# Patient Record
Sex: Female | Born: 1950
Health system: Southern US, Community
[De-identification: ages and names within clinical notes are randomized; demographics above are authoritative.]

## PROBLEM LIST (undated history)

## (undated) DIAGNOSIS — I10 Essential (primary) hypertension: Secondary | ICD-10-CM

## (undated) DIAGNOSIS — E785 Hyperlipidemia, unspecified: Secondary | ICD-10-CM

## (undated) DIAGNOSIS — D126 Benign neoplasm of colon, unspecified: Secondary | ICD-10-CM

## (undated) DIAGNOSIS — D509 Iron deficiency anemia, unspecified: Secondary | ICD-10-CM

## (undated) HISTORY — PX: ABDOMINAL HYSTERECTOMY: SHX81

## (undated) HISTORY — DX: Hyperlipidemia, unspecified: E78.5

## (undated) HISTORY — DX: Essential (primary) hypertension: I10

## (undated) HISTORY — DX: Benign neoplasm of colon, unspecified: D12.6

## (undated) HISTORY — DX: Iron deficiency anemia, unspecified: D50.9

---

## 2001-04-11 ENCOUNTER — Encounter: Payer: Self-pay | Admitting: Family Medicine

## 2001-04-11 ENCOUNTER — Ambulatory Visit (HOSPITAL_COMMUNITY): Admission: RE | Admit: 2001-04-11 | Discharge: 2001-04-11 | Payer: Self-pay | Admitting: Family Medicine

## 2002-04-23 ENCOUNTER — Encounter: Payer: Self-pay | Admitting: Family Medicine

## 2002-04-23 ENCOUNTER — Ambulatory Visit (HOSPITAL_COMMUNITY): Admission: RE | Admit: 2002-04-23 | Discharge: 2002-04-23 | Payer: Self-pay | Admitting: Family Medicine

## 2003-04-25 ENCOUNTER — Ambulatory Visit (HOSPITAL_COMMUNITY): Admission: RE | Admit: 2003-04-25 | Discharge: 2003-04-25 | Payer: Self-pay | Admitting: Family Medicine

## 2003-06-30 ENCOUNTER — Ambulatory Visit (HOSPITAL_COMMUNITY): Admission: RE | Admit: 2003-06-30 | Discharge: 2003-06-30 | Payer: Self-pay | Admitting: Internal Medicine

## 2003-06-30 HISTORY — PX: COLONOSCOPY: SHX174

## 2004-04-26 ENCOUNTER — Ambulatory Visit (HOSPITAL_COMMUNITY): Admission: RE | Admit: 2004-04-26 | Discharge: 2004-04-26 | Payer: Self-pay | Admitting: Family Medicine

## 2004-05-26 ENCOUNTER — Ambulatory Visit (HOSPITAL_COMMUNITY): Admission: RE | Admit: 2004-05-26 | Discharge: 2004-05-26 | Payer: Self-pay | Admitting: Family Medicine

## 2005-05-18 ENCOUNTER — Ambulatory Visit (HOSPITAL_COMMUNITY): Admission: RE | Admit: 2005-05-18 | Discharge: 2005-05-18 | Payer: Self-pay | Admitting: Family Medicine

## 2006-02-07 ENCOUNTER — Emergency Department (HOSPITAL_COMMUNITY): Admission: EM | Admit: 2006-02-07 | Discharge: 2006-02-07 | Payer: Self-pay | Admitting: Emergency Medicine

## 2006-05-25 ENCOUNTER — Ambulatory Visit (HOSPITAL_COMMUNITY): Admission: RE | Admit: 2006-05-25 | Discharge: 2006-05-25 | Payer: Self-pay | Admitting: Family Medicine

## 2006-08-07 ENCOUNTER — Encounter (INDEPENDENT_AMBULATORY_CARE_PROVIDER_SITE_OTHER): Payer: Self-pay | Admitting: Specialist

## 2006-08-07 ENCOUNTER — Ambulatory Visit (HOSPITAL_COMMUNITY): Admission: RE | Admit: 2006-08-07 | Discharge: 2006-08-07 | Payer: Self-pay | Admitting: Internal Medicine

## 2006-08-07 ENCOUNTER — Ambulatory Visit: Payer: Self-pay | Admitting: Internal Medicine

## 2006-08-07 HISTORY — PX: COLONOSCOPY: SHX174

## 2007-08-28 ENCOUNTER — Ambulatory Visit (HOSPITAL_COMMUNITY): Admission: RE | Admit: 2007-08-28 | Discharge: 2007-08-28 | Payer: Self-pay | Admitting: Family Medicine

## 2007-12-05 ENCOUNTER — Ambulatory Visit: Payer: Self-pay | Admitting: Orthopedic Surgery

## 2007-12-05 DIAGNOSIS — M1710 Unilateral primary osteoarthritis, unspecified knee: Secondary | ICD-10-CM | POA: Insufficient documentation

## 2008-05-13 ENCOUNTER — Ambulatory Visit (HOSPITAL_COMMUNITY): Admission: RE | Admit: 2008-05-13 | Discharge: 2008-05-13 | Payer: Self-pay | Admitting: Family Medicine

## 2008-05-15 ENCOUNTER — Ambulatory Visit: Payer: Self-pay | Admitting: Cardiovascular Disease

## 2008-05-19 ENCOUNTER — Encounter: Payer: Self-pay | Admitting: Family Medicine

## 2008-05-19 ENCOUNTER — Ambulatory Visit (HOSPITAL_COMMUNITY): Admission: RE | Admit: 2008-05-19 | Discharge: 2008-05-19 | Payer: Self-pay | Admitting: Family Medicine

## 2008-09-01 ENCOUNTER — Ambulatory Visit (HOSPITAL_COMMUNITY): Admission: RE | Admit: 2008-09-01 | Discharge: 2008-09-01 | Payer: Self-pay | Admitting: Family Medicine

## 2009-07-17 ENCOUNTER — Encounter: Payer: Self-pay | Admitting: Internal Medicine

## 2009-08-03 ENCOUNTER — Ambulatory Visit (HOSPITAL_COMMUNITY): Admission: RE | Admit: 2009-08-03 | Discharge: 2009-08-03 | Payer: Self-pay | Admitting: Internal Medicine

## 2009-08-03 ENCOUNTER — Ambulatory Visit: Payer: Self-pay | Admitting: Internal Medicine

## 2009-08-03 HISTORY — PX: COLONOSCOPY: SHX174

## 2009-08-06 ENCOUNTER — Encounter: Payer: Self-pay | Admitting: Internal Medicine

## 2009-09-03 ENCOUNTER — Ambulatory Visit (HOSPITAL_COMMUNITY): Admission: RE | Admit: 2009-09-03 | Discharge: 2009-09-03 | Payer: Self-pay | Admitting: Family Medicine

## 2010-06-19 ENCOUNTER — Encounter: Payer: Self-pay | Admitting: Family Medicine

## 2010-06-20 ENCOUNTER — Encounter: Payer: Self-pay | Admitting: Family Medicine

## 2010-06-29 NOTE — Letter (Signed)
Summary: Internal Other Domingo Dimes  Internal Other Domingo Dimes   Imported By: Cloria Spring LPN 27/10/2374 28:31:51  _____________________________________________________________________  External Attachment:    Type:   Image     Comment:   External Document

## 2010-06-29 NOTE — Letter (Signed)
Summary: Patient Notice, Colon Biopsy Results  [facility]  [address]   [place], [place] [address]   Phone: [phone]  Fax: [phone]       August 06, 2009   ALANNY RIVERS [address] [place], [place]  [address] 1950-06-29    Dear Ms. Pfefferle,  I am pleased to inform you that the biopsies taken during your recent colonoscopy did not show any evidence of cancer upon pathologic examination.  Additional information/recommendations:  You should have a repeat colonoscopy examination  in 3 years.  Please call us if you are having persistent problems or have questions about your condition that have not been fully answered at this time.  Sincerely,    R. Roetta Sessions MD  [facility] Ph: [phone]    Fax: [phone]   Appended Document: Patient Notice, Colon Biopsy Results Letter mailed.

## 2010-08-25 ENCOUNTER — Other Ambulatory Visit (HOSPITAL_COMMUNITY): Payer: Self-pay | Admitting: Family Medicine

## 2010-08-25 DIAGNOSIS — Z139 Encounter for screening, unspecified: Secondary | ICD-10-CM

## 2010-09-09 ENCOUNTER — Ambulatory Visit (HOSPITAL_COMMUNITY)
Admission: RE | Admit: 2010-09-09 | Discharge: 2010-09-09 | Disposition: A | Discharge status: Home / Self Care | Payer: 59 | Source: Ambulatory Visit | Attending: Family Medicine | Admitting: Family Medicine

## 2010-09-09 DIAGNOSIS — Z139 Encounter for screening, unspecified: Secondary | ICD-10-CM

## 2010-09-09 DIAGNOSIS — Z1231 Encounter for screening mammogram for malignant neoplasm of breast: Secondary | ICD-10-CM | POA: Insufficient documentation

## 2010-10-15 NOTE — Op Note (Signed)
NAMEGEORGIANA, Aimee Dixon               ACCOUNT NO.:  [id number]   MEDICAL RECORD NO.:  [id number]          PATIENT TYPE:  AMB   LOCATION:  DAY                           FACILITY:  [facility]   PHYSICIAN:  R. Roetta Sessions, M.D. DATE OF BIRTH:  1950-12-22   DATE OF PROCEDURE:  08/07/2006  DATE OF DISCHARGE:                               OPERATIVE REPORT   PROCEDURE:  Colonoscopy with biopsy.   INDICATIONS FOR PROCEDURE:  The patient is a 60 year old African  American female who 3 years ago had a tubulovillous adenoma removed from  her left colon.  She is here for surveillance.  She has no lower GI  tract symptoms.  There is no family history of colorectal neoplasia.  Colonoscopy is now being done.  There are no bowel symptoms currently.  This approach has been discussed with the patient at length.  Potential  risks, benefits, and alternatives have been reviewed, and questions  answered.  She is agreeable.  Please see documentation in the medical  record.   PROCEDURE NOTE:  O2 saturation, blood pressure, pulse, and respirations  were monitored throughout the entire procedure.   CONSCIOUS SEDATION:  Versed 5 mg IV and Demerol 75 mg IV in divided  doses.   INSTRUMENT:  Olympus video chip system.   FINDINGS:  Digital rectal exam revealed no abnormalities.  Endoscopic  findings:  The prep was good. Examination of the colonic mucosa was  undertaken from the rectosigmoid junction through the left, transverse,  and right colon, to the appendiceal orifice, ileocecal valve, and cecum.  These structures were well seen and photographed for the record.  From  this level, the scope was slowly withdrawn.  All previously mentioned  mucosal surfaces were again seen. The colonic mucosa appeared normal,  aside from a 3 mm polyp in the mid-ascending colon which was cold  biopsied.  The scope was pulled down into the rectum, where a thorough  examination of the rectal mucosa including retroflexed view of the  anal  verge demonstrated only minimal internal hemorrhoids.  The patient  tolerated the procedure well and was reactivated.   ENDOSCOPIC IMPRESSION:  1. Minimal internal hemorrhoids, otherwise normal rectum.  2. Diminutive ascending colon polyp, removed as described above,      otherwise normal colon.   RECOMMENDATIONS:  1. Follow up on pathology.  2. Further recommendations to follow.      Jonathon Bellows, M.D.  Electronically Signed     RMR/MEDQ  D:  08/07/2006  T:  08/07/2006  Job:  [id number]   cc:   Lorin Picket A. Gerda Diss, MD  Fax: [phone]

## 2010-10-15 NOTE — Op Note (Signed)
NAME:  Aimee Dixon, Aimee Dixon                         ACCOUNT NO.:  [id number]   MEDICAL RECORD NO.:  [id number]                   PATIENT TYPE:  AMB   LOCATION:  DAY                                  FACILITY:  [facility]   PHYSICIAN:  R. Roetta Sessions, M.D.              DATE OF BIRTH:  April 13, 1951   DATE OF PROCEDURE:  06/30/2003  DATE OF DISCHARGE:                                 OPERATIVE REPORT   PROCEDURE:  Colonoscopy and snare polypectomy.   INDICATIONS FOR PROCEDURE:  The patient is a 60 year old African-American  female sent over at the courtesy of Dr. Lilyan Punt for colorectal cancer  screening. She is devoid of any lower GI tract symptoms or family history of  colorectal neoplasia.  She has never had her lower GI tract imaged.  Colonoscopy is now being done as a standard screening maneuver.  This  approach has been discussed with the patient at length.  The potential  risks, benefits, and alternatives have been reviewed and questions answered.  Please see my handwritten H & P for more information.   PROCEDURE NOTE:  O2 saturation, blood pressure, pulse, and respirations were  monitored throughout the entire procedure.   CONSCIOUS SEDATION:  Versed 4 mg IV, Demerol 75 mg IV in divided doses.   INSTRUMENT:  Olympus video chip pediatric colonoscope.   FINDINGS:  Digital rectal exam revealed no abnormalities.   ENDOSCOPIC FINDINGS:  Prep was adequate.   RECTUM:  Examination of the rectal mucosa including retroflexed view of the  anal verge revealed only a couple of anal papillae.  Rectal mucosa,  otherwise, appeared normal.   COLON:  The colonic mucosa was surveyed from the rectosigmoid junction  through the left transverse, right colon, through the appendiceal orifice,  ileocecal valve, and cecum. These structures were well-seen and photographed  for the record.  From this level, the scope was slowly withdrawn.  All  previously mentioned mucosal surfaces were once again seen.   There was a 0.5  cm pedunculated polyp at the hepatic flexure, which was cold snared.  There  was a 1.5 cm angry, pedunculated polyp in the middescending colon, which was  resected with snare cautery.  The larger polyp was retrieved with the [employer]  The smaller polyp was not retrieved at the time of this dictation.   The colonic mucosa was otherwise normal in appearance.  The patient  tolerated the procedure well and was reacted in endoscopy.   IMPRESSION:  1. Anal papilla.  Otherwise, normal rectum.  2. Pedunculated polyp at middescending colon, resected with snare cautery.  3. Smaller pedunculated polyp at hepatic flexure, cold snared.  4. The remainder of the colonic mucosa appeared normal.   RECOMMENDATIONS:  1. No aspirin or arthritis medications for the next ten days.  2. Followup on pathology.  3. Further recommendations to follow.      ___________________________________________  Jonathon Bellows, M.D.   RMR/MEDQ  D:  06/30/2003  T:  06/30/2003  Job:  [id number]   cc:   Lorin Picket A. Gerda Diss, M.D.  [address]  [place]  [place] [address]  Fax: [phone]

## 2011-08-16 ENCOUNTER — Other Ambulatory Visit: Payer: Self-pay | Admitting: Family Medicine

## 2011-08-16 DIAGNOSIS — Z139 Encounter for screening, unspecified: Secondary | ICD-10-CM

## 2011-09-12 ENCOUNTER — Ambulatory Visit (HOSPITAL_COMMUNITY)
Admission: RE | Admit: 2011-09-12 | Discharge: 2011-09-12 | Disposition: A | Discharge status: Home / Self Care | Payer: 59 | Source: Ambulatory Visit | Attending: Family Medicine | Admitting: Family Medicine

## 2011-09-12 DIAGNOSIS — Z139 Encounter for screening, unspecified: Secondary | ICD-10-CM

## 2011-09-12 DIAGNOSIS — Z1231 Encounter for screening mammogram for malignant neoplasm of breast: Secondary | ICD-10-CM | POA: Insufficient documentation

## 2012-08-13 ENCOUNTER — Encounter: Payer: Self-pay | Admitting: Internal Medicine

## 2012-08-14 ENCOUNTER — Telehealth: Payer: Self-pay | Admitting: Internal Medicine

## 2012-08-14 ENCOUNTER — Encounter: Payer: Self-pay | Admitting: Urgent Care

## 2012-08-14 ENCOUNTER — Ambulatory Visit (INDEPENDENT_AMBULATORY_CARE_PROVIDER_SITE_OTHER): Payer: 59 | Admitting: Urgent Care

## 2012-08-14 VITALS — BP 143/84 | HR 75 | Temp 97.2°F | Ht 62.0 in | Wt 202.2 lb

## 2012-08-14 DIAGNOSIS — D126 Benign neoplasm of colon, unspecified: Secondary | ICD-10-CM

## 2012-08-14 MED ORDER — PEG 3350-KCL-NA BICARB-NACL 420 G PO SOLR: 4000.0000 mL | ORAL | Status: DC

## 2012-08-14 NOTE — Patient Instructions (Addendum)
Colonoscopy with Dr Jena Gauss Hold iron for 1 week prior to procedure

## 2012-08-14 NOTE — Progress Notes (Signed)
Referring Provider: Babs Sciara, MD Primary Care Physician:  Lilyan Punt, MD Primary Gastroenterologist:  Dr. Jena Gauss  Chief Complaint  Patient presents with  . Colonoscopy    Hx colonic adenomas    HPI:  Aimee Dixon is a 62 y.o. female here to set up surveillance colonoscopy for personal history of adenomatous polyps. Last colonoscopy 3 years ago by Dr Jena Gauss she was found to have tubular adenomas, internal hemorrhoids & anal papilla.  She occasionally has constipation.  When she eats more fiber & drinks a lot of water, she seems to be more regular.  She denies diarrhea, rectal bleeding, melena or weight loss.      Past Medical History  Diagnosis Date  . Hypertension   . Tubular adenoma of colon   . Hyperlipidemia   . IDA (iron deficiency anemia)     Past Surgical History  Procedure Laterality Date  . Colonoscopy  06/30/2003    ZOX:WRUE papilla/Otherwise,normal rectum/Pedunculated polyp at middescending colon/Smaller pedunculated polyp at hepatic flexure, cold snared  . Colonoscopy   08/07/2006    AVW:UJWJXBJ internal hemorrhoids, otherwise normal rectum/Diminutive ascending colon adenomatous polyps  . Colonoscopy  08/03/2009    RMR: tubular adenomas removed from hepatic flexure & desc colon, internal hemorrhoids and anal papilla otherwise norma;    Current Outpatient Prescriptions  Medication Sig Dispense Refill  . calcium-vitamin D (OSCAL WITH D) 250-125 MG-UNIT per tablet Take 1 tablet by mouth daily.      . ferrous sulfate 325 (65 FE) MG tablet Take 325 mg by mouth daily with breakfast.      . fexofenadine (ALLEGRA) 180 MG tablet Take 180 mg by mouth daily.      Marland Kitchen lisinopril-hydrochlorothiazide (PRINZIDE,ZESTORETIC) 20-25 MG per tablet Take 1 tablet by mouth daily.      . pravastatin (PRAVACHOL) 80 MG tablet Take 80 mg by mouth daily.      . polyethylene glycol-electrolytes (TRILYTE) 420 G solution Take 4,000 mLs by mouth as directed.  4000 mL  0   No current  facility-administered medications for this visit.    Allergies as of 08/14/2012  . (No Known Allergies)    Family History:There is no known family history of colorectal carcinoma , liver disease, or inflammatory bowel disease.  Problem Relation Age of Onset  . Lung cancer Father   . Breast cancer Mother     History   Social History  . Marital Status: Married    Spouse Name: N/A    Number of Children: 1  . Years of Education: N/A   Occupational History  . [employer]    Social History Main Topics  . Smoking status: Never Smoker   . Smokeless tobacco: Not on file  . Alcohol Use: No  . Drug Use: No  . Sexually Active: Not on file   Other Topics Concern  . Not on file   Social History Narrative   Lives w/ husband    Review of Systems: Gen: Denies any fever, chills, sweats, anorexia, fatigue, weakness, malaise, weight loss, and sleep disorder CV: Denies chest pain, angina, palpitations, syncope, orthopnea, PND, peripheral edema, and claudication. Resp: Denies dyspnea at rest, dyspnea with exercise, cough, sputum, wheezing, coughing up blood, and pleurisy. GI: Denies vomiting blood, jaundice, and fecal incontinence.   Denies dysphagia or odynophagia. GU : Denies urinary burning, blood in urine, urinary frequency, urinary hesitancy, nocturnal urination, and urinary incontinence. MS: Denies joint pain, limitation of movement, and swelling, stiffness, low back pain, extremity pain.  Denies muscle weakness, cramps, atrophy.  Derm: Denies rash, itching, dry skin, hives, moles, warts, or unhealing ulcers.  Psych: Denies depression, anxiety, memory loss, suicidal ideation, hallucinations, paranoia, and confusion. Heme: Denies bruising, bleeding, and enlarged lymph nodes. Neuro:  Denies any headaches, dizziness, paresthesias. Endo:  Denies any problems with DM, thyroid, adrenal function.  Physical Exam: BP 143/84  Pulse 75  Temp(Src) 97.2 F (36.2 C) (Oral)  Ht 5\' 2"   (1.575 m)  Wt 202 lb 3.2 oz (91.717 kg)  BMI 36.97 kg/m2 No LMP recorded. Patient has had a hysterectomy. General:   Alert,  Well-developed, well-nourished, pleasant and cooperative in NAD Head:  Normocephalic and atraumatic. Eyes:  Sclera clear, no icterus.   Conjunctiva pink. Ears:  Normal auditory acuity. Nose:  No deformity, discharge, or lesions. Mouth:  No deformity or lesions,oropharynx pink & moist. Neck:  Supple; no masses or thyromegaly. Lungs:  Clear throughout to auscultation.   No wheezes, crackles, or rhonchi. No acute distress. Heart:  Regular rate and rhythm; no murmurs, clicks, rubs,  or gallops. Abdomen:  Normal bowel sounds.  No bruits.  Soft, non-tender and non-distended without masses, hepatosplenomegaly or hernias noted.  No guarding or rebound tenderness.   Rectal:  Deferred.  Msk:  Symmetrical without gross deformities. Normal posture. Pulses:  Normal pulses noted. Extremities:  No clubbing or edema. Neurologic:  Alert and oriented x4;  grossly normal neurologically. Skin:  Intact without significant lesions or rashes. Lymph Nodes:  No significant cervical adenopathy. Psych:  Alert and cooperative. Normal mood and affect. '

## 2012-08-14 NOTE — Telephone Encounter (Signed)
Pt called to cancel her tcs for 3/31 with RMR. She is aware of OV in April to come back for a updated HP prior to procedure. She wants tcs done in May. LMOM for Kim Faint to cancel tcs

## 2012-08-14 NOTE — Assessment & Plan Note (Addendum)
Aimee Dixon is a pleasant 62 y.o. female due for surveillance colonoscopy with Dr Jena Gauss given hx of multiple tubular adenomas.  I have discussed risks & benefits which include, but are not limited to, bleeding, infection, perforation & drug reaction.  The patient agrees with this plan & written consent will be obtained.    Hold iron 7 days prior to colonoscopy.

## 2012-08-15 NOTE — Progress Notes (Signed)
Faxed to PCP

## 2012-08-27 ENCOUNTER — Ambulatory Visit (HOSPITAL_COMMUNITY): Admission: RE | Admit: 2012-08-27 | Payer: 59 | Source: Ambulatory Visit | Admitting: Internal Medicine

## 2012-08-27 ENCOUNTER — Encounter (HOSPITAL_COMMUNITY): Admission: RE | Payer: Self-pay | Source: Ambulatory Visit

## 2012-08-27 SURGERY — COLONOSCOPY
Anesthesia: Moderate Sedation

## 2012-09-10 ENCOUNTER — Other Ambulatory Visit: Payer: Self-pay | Admitting: Family Medicine

## 2012-09-10 DIAGNOSIS — Z139 Encounter for screening, unspecified: Secondary | ICD-10-CM

## 2012-09-17 ENCOUNTER — Ambulatory Visit (HOSPITAL_COMMUNITY): Payer: 59

## 2012-09-20 ENCOUNTER — Ambulatory Visit (INDEPENDENT_AMBULATORY_CARE_PROVIDER_SITE_OTHER): Payer: 59 | Admitting: Gastroenterology

## 2012-09-20 ENCOUNTER — Ambulatory Visit (HOSPITAL_COMMUNITY)
Admission: RE | Admit: 2012-09-20 | Discharge: 2012-09-20 | Disposition: A | Discharge status: Home / Self Care | Payer: 59 | Source: Ambulatory Visit | Attending: Family Medicine | Admitting: Family Medicine

## 2012-09-20 ENCOUNTER — Encounter (HOSPITAL_COMMUNITY): Payer: Self-pay | Admitting: Pharmacy Technician

## 2012-09-20 ENCOUNTER — Encounter: Payer: Self-pay | Admitting: Gastroenterology

## 2012-09-20 ENCOUNTER — Other Ambulatory Visit: Payer: Self-pay | Admitting: Internal Medicine

## 2012-09-20 VITALS — BP 128/71 | HR 76 | Temp 97.2°F | Ht 62.0 in | Wt 201.8 lb

## 2012-09-20 DIAGNOSIS — Z139 Encounter for screening, unspecified: Secondary | ICD-10-CM

## 2012-09-20 DIAGNOSIS — D369 Benign neoplasm, unspecified site: Secondary | ICD-10-CM

## 2012-09-20 DIAGNOSIS — D126 Benign neoplasm of colon, unspecified: Secondary | ICD-10-CM

## 2012-09-20 DIAGNOSIS — Z1231 Encounter for screening mammogram for malignant neoplasm of breast: Secondary | ICD-10-CM | POA: Insufficient documentation

## 2012-09-20 MED ORDER — PEG-KCL-NACL-NASULF-NA ASC-C 100 G PO SOLR: 1.0000 | ORAL | Status: DC

## 2012-09-20 NOTE — Progress Notes (Signed)
Referring Provider: Luking, Scott A, MD Primary Care Physician:  LUKING,SCOTT, MD Primary GI: Dr. Rourk   Chief Complaint  Patient presents with  . Follow-up    HPI:   [name] is a very pleasant 61-year-old female presenting today for a routine visit prior to surveillance colonoscopy. She has a history of adenomatous polyps. Her last colonoscopy with Dr. Rourk in 2011 noted tubular adenomas removed from hepatic flexure and descending colon. She has no complaints or concerns today. Denies abdominal pain, rectal bleeding, changes in bowel habits. No N/V, rare reflux depending on what she eats such as spicy foods or coffee. Not routine.    Past Medical History  Diagnosis Date  . Hypertension   . Tubular adenoma of colon   . Hyperlipidemia   . IDA (iron deficiency anemia)     Past Surgical History  Procedure Laterality Date  . Colonoscopy  06/30/2003    RMR:Anal papilla/Otherwise,normal rectum/Pedunculated polyp at middescending colon/Smaller pedunculated polyp at hepatic flexure, cold snared  . Colonoscopy   08/07/2006    RMR:Minimal internal hemorrhoids, otherwise normal rectum/Diminutive ascending colon adenomatous polyps  . Colonoscopy  08/03/2009    RMR: tubular adenomas removed from hepatic flexure & desc colon, internal hemorrhoids and anal papilla otherwise norma;  . Abdominal hysterectomy      Current Outpatient Prescriptions  Medication Sig Dispense Refill  . calcium-vitamin D (OSCAL WITH D) 250-125 MG-UNIT per tablet Take 1 tablet by mouth daily.      . ferrous sulfate 325 (65 FE) MG tablet Take 325 mg by mouth daily with breakfast.      . fexofenadine (ALLEGRA) 180 MG tablet Take 180 mg by mouth daily.      . lisinopril-hydrochlorothiazide (PRINZIDE,ZESTORETIC) 20-25 MG per tablet Take 1 tablet by mouth daily.      . pravastatin (PRAVACHOL) 80 MG tablet Take 80 mg by mouth daily.      . polyethylene glycol-electrolytes (TRILYTE) 420 G solution Take 4,000 mLs  by mouth as directed.  4000 mL  0   No current facility-administered medications for this visit.    Allergies as of 09/20/2012  . (No Known Allergies)    Family History  Problem Relation Age of Onset  . Lung cancer Father   . Breast cancer Mother   . Colon cancer Neg Hx     History   Social History  . Marital Status: Married    Spouse Name: N/A    Number of Children: 1  . Years of Education: N/A   Occupational History  . [employer]    Social History Main Topics  . Smoking status: Never Smoker   . Smokeless tobacco: None  . Alcohol Use: No  . Drug Use: No  . Sexually Active: None   Other Topics Concern  . None   Social History Narrative   Lives w/ husband    Review of Systems: Gen: Denies fever, chills, anorexia. Denies fatigue, weakness, weight loss.  CV: Denies chest pain, palpitations, syncope, peripheral edema, and claudication. Resp: Denies dyspnea at rest, cough, wheezing, coughing up blood, and pleurisy. GI: Denies vomiting blood, jaundice, and fecal incontinence.   Denies dysphagia or odynophagia. Derm: Denies rash, itching, dry skin Psych: Denies depression, anxiety, memory loss, confusion. No homicidal or suicidal ideation.  Heme: Denies bruising, bleeding, and enlarged lymph nodes.  Physical Exam: BP 128/71  Pulse 76  Temp(Src) 97.2 F (36.2 C) (Oral)  Ht 5' 2" (1.575 m)  Wt 201 lb 12.8 oz (  91.536 kg)  BMI 36.9 kg/m2 General:   Alert and oriented. No distress noted. Pleasant and cooperative.  Head:  Normocephalic and atraumatic. Eyes:  Conjuctiva clear without scleral icterus. Mouth:  Oral mucosa pink and moist. Good dentition. No lesions. Neck:  Supple, without mass or thyromegaly. Heart:  S1, S2 present without murmurs, rubs, or gallops. Regular rate and rhythm. Abdomen:  +BS, soft, non-tender and non-distended. No rebound or guarding. No HSM or masses noted. Msk:  Symmetrical without gross deformities. Normal  posture. Extremities:  Without edema. Neurologic:  Alert and  oriented x4;  grossly normal neurologically. Skin:  Intact without significant lesions or rashes. Cervical Nodes:  No significant cervical adenopathy. Psych:  Alert and cooperative. Normal mood and affect.  

## 2012-09-20 NOTE — Patient Instructions (Addendum)
We have set you up for a colonoscopy with Dr. Rourk in the near future.  Further recommendations to follow.    

## 2012-09-20 NOTE — Assessment & Plan Note (Signed)
Pleasant 62 year old female with history of multiple tubular adenomas, now due for surveillance colonoscopy. Last colonoscopy by Dr. Jena Gauss in 2011. She has absolutely no lower GI symptoms or concerns.   Proceed with TCS with Dr. Jena Gauss in near future: the risks, benefits, and alternatives have been discussed with the patient in detail. The patient states understanding and desires to proceed.

## 2012-09-20 NOTE — Progress Notes (Signed)
CC PCP 

## 2012-09-24 NOTE — Progress Notes (Signed)
Pt give mammogram results.

## 2012-10-03 ENCOUNTER — Encounter (HOSPITAL_COMMUNITY)
Admission: RE | Disposition: A | Discharge status: Home / Self Care | Payer: Self-pay | Source: Ambulatory Visit | Attending: Internal Medicine

## 2012-10-03 ENCOUNTER — Ambulatory Visit (HOSPITAL_COMMUNITY)
Admission: RE | Admit: 2012-10-03 | Discharge: 2012-10-03 | Disposition: A | Discharge status: Home / Self Care | Payer: 59 | Source: Ambulatory Visit | Attending: Internal Medicine | Admitting: Internal Medicine

## 2012-10-03 ENCOUNTER — Encounter (HOSPITAL_COMMUNITY): Payer: Self-pay | Admitting: *Deleted

## 2012-10-03 DIAGNOSIS — Z1211 Encounter for screening for malignant neoplasm of colon: Secondary | ICD-10-CM

## 2012-10-03 DIAGNOSIS — Z09 Encounter for follow-up examination after completed treatment for conditions other than malignant neoplasm: Secondary | ICD-10-CM | POA: Insufficient documentation

## 2012-10-03 DIAGNOSIS — I10 Essential (primary) hypertension: Secondary | ICD-10-CM | POA: Insufficient documentation

## 2012-10-03 DIAGNOSIS — Z860100 Personal history of colon polyps, unspecified: Secondary | ICD-10-CM | POA: Insufficient documentation

## 2012-10-03 DIAGNOSIS — D369 Benign neoplasm, unspecified site: Secondary | ICD-10-CM

## 2012-10-03 HISTORY — PX: COLONOSCOPY: SHX5424

## 2012-10-03 SURGERY — COLONOSCOPY
Anesthesia: Moderate Sedation

## 2012-10-03 MED ORDER — STERILE WATER FOR IRRIGATION IR SOLN
Status: DC | PRN
  Administered 2012-10-03: 15:00:00

## 2012-10-03 MED ORDER — MIDAZOLAM HCL 5 MG/5ML IJ SOLN
INTRAMUSCULAR | Status: AC
  Filled 2012-10-03: qty 10

## 2012-10-03 MED ORDER — SODIUM CHLORIDE 0.9 % IV SOLN
INTRAVENOUS | Status: DC
  Administered 2012-10-03: 1000 mL via INTRAVENOUS

## 2012-10-03 MED ORDER — MIDAZOLAM HCL 5 MG/5ML IJ SOLN
INTRAMUSCULAR | Status: DC | PRN
  Administered 2012-10-03: 2 mg via INTRAVENOUS
  Administered 2012-10-03: 1 mg via INTRAVENOUS

## 2012-10-03 MED ORDER — MEPERIDINE HCL 100 MG/ML IJ SOLN
INTRAMUSCULAR | Status: DC | PRN
  Administered 2012-10-03: 50 mg via INTRAVENOUS
  Administered 2012-10-03: 25 mg via INTRAVENOUS

## 2012-10-03 MED ORDER — ONDANSETRON HCL 4 MG/2ML IJ SOLN
INTRAMUSCULAR | Status: DC | PRN
  Administered 2012-10-03: 4 mg via INTRAVENOUS

## 2012-10-03 MED ORDER — ONDANSETRON HCL 4 MG/2ML IJ SOLN
INTRAMUSCULAR | Status: AC
  Filled 2012-10-03: qty 2

## 2012-10-03 MED ORDER — MEPERIDINE HCL 100 MG/ML IJ SOLN
INTRAMUSCULAR | Status: AC
  Filled 2012-10-03: qty 2

## 2012-10-03 NOTE — Interval H&P Note (Signed)
History and Physical Interval Note:  10/03/2012 2:48 PM  Aimee Dixon  has presented today for surgery, with the diagnosis of ADENOMATOUS POLYPS  The various methods of treatment have been discussed with the patient and family. After consideration of risks, benefits and other options for treatment, the patient has consented to  Procedure(s) with comments: COLONOSCOPY (N/A) - 2:15 as a surgical intervention .  The patient's history has been reviewed, patient examined, no change in status, stable for surgery.  I have reviewed the patient's chart and labs.  Questions were answered to the patient's satisfaction.     Colonoscopy per plan. The risks, benefits, limitations, alternatives and imponderables have been reviewed with the patient. Questions have been answered. All parties are agreeable.   [name]

## 2012-10-03 NOTE — H&P (View-Only) (Signed)
Referring Provider: Babs Sciara, MD Primary Care Physician:  Lilyan Punt, MD Primary GI: Dr. Jena Gauss   Chief Complaint  Patient presents with  . Follow-up    HPI:   [name] is a very pleasant 62 year old female presenting today for a routine visit prior to surveillance colonoscopy. She has a history of adenomatous polyps. Her last colonoscopy with Dr. Jena Gauss in 2011 noted tubular adenomas removed from hepatic flexure and descending colon. She has no complaints or concerns today. Denies abdominal pain, rectal bleeding, changes in bowel habits. No N/V, rare reflux depending on what she eats such as spicy foods or coffee. Not routine.    Past Medical History  Diagnosis Date  . Hypertension   . Tubular adenoma of colon   . Hyperlipidemia   . IDA (iron deficiency anemia)     Past Surgical History  Procedure Laterality Date  . Colonoscopy  06/30/2003    HYQ:MVHQ papilla/Otherwise,normal rectum/Pedunculated polyp at middescending colon/Smaller pedunculated polyp at hepatic flexure, cold snared  . Colonoscopy   08/07/2006    ION:GEXBMWU internal hemorrhoids, otherwise normal rectum/Diminutive ascending colon adenomatous polyps  . Colonoscopy  08/03/2009    RMR: tubular adenomas removed from hepatic flexure & desc colon, internal hemorrhoids and anal papilla otherwise norma;  . Abdominal hysterectomy      Current Outpatient Prescriptions  Medication Sig Dispense Refill  . calcium-vitamin D (OSCAL WITH D) 250-125 MG-UNIT per tablet Take 1 tablet by mouth daily.      . ferrous sulfate 325 (65 FE) MG tablet Take 325 mg by mouth daily with breakfast.      . fexofenadine (ALLEGRA) 180 MG tablet Take 180 mg by mouth daily.      Marland Kitchen lisinopril-hydrochlorothiazide (PRINZIDE,ZESTORETIC) 20-25 MG per tablet Take 1 tablet by mouth daily.      . pravastatin (PRAVACHOL) 80 MG tablet Take 80 mg by mouth daily.      . polyethylene glycol-electrolytes (TRILYTE) 420 G solution Take 4,000 mLs  by mouth as directed.  4000 mL  0   No current facility-administered medications for this visit.    Allergies as of 09/20/2012  . (No Known Allergies)    Family History  Problem Relation Age of Onset  . Lung cancer Father   . Breast cancer Mother   . Colon cancer Neg Hx     History   Social History  . Marital Status: Married    Spouse Name: N/A    Number of Children: 1  . Years of Education: N/A   Occupational History  . [employer]    Social History Main Topics  . Smoking status: Never Smoker   . Smokeless tobacco: None  . Alcohol Use: No  . Drug Use: No  . Sexually Active: None   Other Topics Concern  . None   Social History Narrative   Lives w/ husband    Review of Systems: Gen: Denies fever, chills, anorexia. Denies fatigue, weakness, weight loss.  CV: Denies chest pain, palpitations, syncope, peripheral edema, and claudication. Resp: Denies dyspnea at rest, cough, wheezing, coughing up blood, and pleurisy. GI: Denies vomiting blood, jaundice, and fecal incontinence.   Denies dysphagia or odynophagia. Derm: Denies rash, itching, dry skin Psych: Denies depression, anxiety, memory loss, confusion. No homicidal or suicidal ideation.  Heme: Denies bruising, bleeding, and enlarged lymph nodes.  Physical Exam: BP 128/71  Pulse 76  Temp(Src) 97.2 F (36.2 C) (Oral)  Ht 5\' 2"  (1.575 m)  Wt 201 lb 12.8 oz (  91.536 kg)  BMI 36.9 kg/m2 General:   Alert and oriented. No distress noted. Pleasant and cooperative.  Head:  Normocephalic and atraumatic. Eyes:  Conjuctiva clear without scleral icterus. Mouth:  Oral mucosa pink and moist. Good dentition. No lesions. Neck:  Supple, without mass or thyromegaly. Heart:  S1, S2 present without murmurs, rubs, or gallops. Regular rate and rhythm. Abdomen:  +BS, soft, non-tender and non-distended. No rebound or guarding. No HSM or masses noted. Msk:  Symmetrical without gross deformities. Normal  posture. Extremities:  Without edema. Neurologic:  Alert and  oriented x4;  grossly normal neurologically. Skin:  Intact without significant lesions or rashes. Cervical Nodes:  No significant cervical adenopathy. Psych:  Alert and cooperative. Normal mood and affect.

## 2012-10-03 NOTE — Op Note (Signed)
[facility] [address] [place], [address]   COLONOSCOPY PROCEDURE REPORT  PATIENT: Aimee Dixon, Aimee Dixon  MR#:         [id number] BIRTHDATE: 1951-01-22 , 61  yrs. old GENDER: Female ENDOSCOPIST: R.  Roetta Sessions, MD FACP FACG REFERRED BY:  Lilyan Punt, M.D. PROCEDURE DATE:  10/03/2012 PROCEDURE:     Surveillance colonoscopy  INDICATIONS: History of colonic adenoma  INFORMED CONSENT:  The risks, benefits, alternatives and imponderables including but not limited to bleeding, perforation as well as the possibility of a missed lesion have been reviewed.  The potential for biopsy, lesion removal, etc. have also been discussed.  Questions have been answered.  All parties agreeable. Please see the history and physical in the medical record for more information.  MEDICATIONS: Versed 3 mg IV and Demerol 75 mg IV in divided doses. Zofran 4 mg IV  DESCRIPTION OF PROCEDURE:  After a digital rectal exam was performed, the EC-[id number] ([id number])  colonoscope was advanced from the anus through the rectum and colon to the area of the cecum, ileocecal valve and appendiceal orifice.  The cecum was deeply intubated.  These structures were well-seen and photographed for the record.  From the level of the cecum and ileocecal valve, the scope was slowly and cautiously withdrawn.  The mucosal surfaces were carefully surveyed utilizing scope tip deflection to facilitate fold flattening as needed.  The scope was pulled down into the rectum where a thorough examination including retroflexion was performed.    FINDINGS:  Adequate preparation. Normal rectum. Normal-appearing colonic mucosa.  THERAPEUTIC / DIAGNOSTIC MANEUVERS PERFORMED:  None  COMPLICATIONS: None  CECAL WITHDRAWAL TIME:  8 minutes  IMPRESSION:  Normal colonoscopy  RECOMMENDATIONS:   Repeat colonoscopy for surveillance purposes in 5 years.   _______________________________ eSigned:  R. Roetta Sessions, MD FACP  [facility] 10/03/2012 3:23 PM   CC:

## 2012-10-05 ENCOUNTER — Encounter (HOSPITAL_COMMUNITY): Payer: Self-pay | Admitting: Internal Medicine

## 2012-10-05 ENCOUNTER — Encounter: Payer: Self-pay | Admitting: *Deleted

## 2012-10-26 ENCOUNTER — Encounter: Payer: Self-pay | Admitting: Family Medicine

## 2012-10-26 ENCOUNTER — Ambulatory Visit (INDEPENDENT_AMBULATORY_CARE_PROVIDER_SITE_OTHER): Payer: Managed Care, Other (non HMO) | Admitting: Family Medicine

## 2012-10-26 VITALS — BP 166/88 | Temp 98.8°F | Wt 198.6 lb

## 2012-10-26 DIAGNOSIS — J209 Acute bronchitis, unspecified: Secondary | ICD-10-CM

## 2012-10-26 MED ORDER — ALBUTEROL SULFATE HFA 108 (90 BASE) MCG/ACT IN AERS: 2.0000 | INHALATION_SPRAY | Freq: Four times a day (QID) | RESPIRATORY_TRACT | Status: DC | PRN

## 2012-10-26 MED ORDER — CEFPROZIL 500 MG PO TABS: 500.0000 mg | ORAL_TABLET | Freq: Two times a day (BID) | ORAL | Status: DC

## 2012-10-26 NOTE — Progress Notes (Signed)
  Subjective:    Patient ID: Aimee Dixon, female    DOB: January 06, 1951, 62 y.o.   MRN: [id number]  Cough This is a new problem. The current episode started in the past 7 days. The problem has been gradually worsening. The problem occurs every few minutes. The cough is non-productive. Associated symptoms include ear congestion and rhinorrhea. Nothing aggravates the symptoms. She has tried OTC cough suppressant for the symptoms. The treatment provided moderate relief.      Review of Systems  HENT: Positive for rhinorrhea.   Respiratory: Positive for cough.    ROS otherwise negative    Objective:   Physical Exam  Alert no acute distress. Vitals reviewed. HEENT moderate nasal congestion frontal tenderness. Pharynx some erythema neck supple. Lungs clear heart regular in rhythm      Assessment & Plan:  Impression rhinosinusitis. Plan antibiotics prescribed. Symptomatic care discussed.

## 2013-07-19 ENCOUNTER — Other Ambulatory Visit: Payer: Self-pay | Admitting: Family Medicine

## 2013-07-23 ENCOUNTER — Encounter: Payer: Managed Care, Other (non HMO) | Admitting: Family Medicine

## 2013-08-02 ENCOUNTER — Ambulatory Visit (INDEPENDENT_AMBULATORY_CARE_PROVIDER_SITE_OTHER): Payer: Managed Care, Other (non HMO) | Admitting: Family Medicine

## 2013-08-02 ENCOUNTER — Encounter: Payer: Self-pay | Admitting: Family Medicine

## 2013-08-02 VITALS — BP 142/88 | Ht 63.0 in | Wt 204.0 lb

## 2013-08-02 DIAGNOSIS — R350 Frequency of micturition: Secondary | ICD-10-CM

## 2013-08-02 DIAGNOSIS — I10 Essential (primary) hypertension: Secondary | ICD-10-CM

## 2013-08-02 DIAGNOSIS — R7309 Other abnormal glucose: Secondary | ICD-10-CM

## 2013-08-02 DIAGNOSIS — Z0000 Encounter for general adult medical examination without abnormal findings: Secondary | ICD-10-CM

## 2013-08-02 DIAGNOSIS — M25549 Pain in joints of unspecified hand: Secondary | ICD-10-CM | POA: Insufficient documentation

## 2013-08-02 DIAGNOSIS — R7303 Prediabetes: Secondary | ICD-10-CM | POA: Insufficient documentation

## 2013-08-02 DIAGNOSIS — E785 Hyperlipidemia, unspecified: Secondary | ICD-10-CM

## 2013-08-02 LAB — POCT URINALYSIS DIPSTICK
Spec Grav, UA: 1.015
pH, UA: 5

## 2013-08-02 LAB — POCT GLYCOSYLATED HEMOGLOBIN (HGB A1C): HEMOGLOBIN A1C: 5.6

## 2013-08-02 MED ORDER — PRAVASTATIN SODIUM 80 MG PO TABS: ORAL_TABLET | ORAL | Status: DC

## 2013-08-02 MED ORDER — METRONIDAZOLE 500 MG PO TABS: ORAL_TABLET | ORAL | Status: AC

## 2013-08-02 MED ORDER — LISINOPRIL-HYDROCHLOROTHIAZIDE 20-25 MG PO TABS: ORAL_TABLET | ORAL | Status: DC

## 2013-08-02 NOTE — Patient Instructions (Signed)
6 months - Lipid , HgA1C, Ferritin, Liver

## 2013-08-02 NOTE — Progress Notes (Signed)
   Subjective:    Patient ID: Aimee Dixon, female    DOB: 1951-01-02, 63 y.o.   MRN: [id number]  HPIPhysical. Patient brought in a copy of her bloodwork. Blood work was reviewed in detail her A1c was mildly elevated but we repeated this and this actually looks much better now she is trying to watch her diet she is trying to exercise to some degree The patient comes in today for a wellness visit.  A review of their health history was completed.  A review of medications was also completed. Any necessary refills were discussed. Sensible healthy diet was discussed. Importance of minimizing excessive salt and carbohydrates was also discussed. Safety was stressed including driving, activities at work and at home where applicable. Importance of regular physical activity for overall health was discussed. Preventative measures appropriate for age were discussed. Time was spent with the patient discussing any concerns they have about their well-being.  Frequent urination and pt has noticied an odor to urine. She denies dysuria. The urine itself looked good under the microscope. Pelvic exam did have some discharge.  Review of Systems  Constitutional: Negative for activity change, appetite change and fatigue.  HENT: Negative for congestion, ear discharge and rhinorrhea.   Eyes: Negative for discharge.  Respiratory: Negative for cough, chest tightness and wheezing.   Cardiovascular: Negative for chest pain.  Gastrointestinal: Negative for vomiting and abdominal pain.  Genitourinary: Negative for frequency and difficulty urinating.  Musculoskeletal: Negative for neck pain.  Allergic/Immunologic: Negative for environmental allergies and food allergies.  Neurological: Negative for weakness and headaches.  Psychiatric/Behavioral: Negative for behavioral problems and agitation.       Objective:   Physical Exam  Constitutional: She is oriented to person, place, and time. She appears well-developed  and well-nourished.  HENT:  Head: Normocephalic.  Right Ear: External ear normal.  Left Ear: External ear normal.  Neck: No thyromegaly present.  Cardiovascular: Normal rate, regular rhythm, normal heart sounds and intact distal pulses.   No murmur heard. Pulmonary/Chest: Effort normal and breath sounds normal. No respiratory distress. She has no wheezes.  Abdominal: Soft. Bowel sounds are normal. She exhibits no distension and no mass. There is no tenderness.  Musculoskeletal: Normal range of motion. She exhibits no edema and no tenderness.  Lymphadenopathy:    She has no cervical adenopathy.  Neurological: She is alert and oriented to person, place, and time. She exhibits normal muscle tone.  Skin: Skin is warm and dry.  Psychiatric: She has a normal mood and affect. Her behavior is normal.   pelvic exam was done without difficulty no cervix no masses felt Breast exam normal no masses Moderate arthritic changes in the hands with difficulty closing the right hand        Assessment & Plan:  #1 hand arthralgia-x-ray of the right hand. In the past negative sedimentation rate negative rheumatoid factor. If significant changes noted may well need rheumatology consult patient also has difficult time closing the right hand may continue using Aleve.  #2 HTN on recheck her blood pressure was actually very good she is to exercise try to keep her weight under check  #3 hyperglycemia actually prediabetes under very good control currently no medications indicated  #4 hyperlipidemia under good control continue current measures  #5 bacterial vaginosis/excessive discharge Flagyl for one day  Number for wellness exam today pelvic exam normal Pap smear not indicated. Patient keeping up on mammograms. She is up-to-date on her colonoscopy.

## 2013-08-05 ENCOUNTER — Other Ambulatory Visit: Payer: Self-pay | Admitting: Family Medicine

## 2013-08-05 ENCOUNTER — Ambulatory Visit (HOSPITAL_COMMUNITY)
Admission: RE | Admit: 2013-08-05 | Discharge: 2013-08-05 | Disposition: A | Discharge status: Home / Self Care | Payer: 59 | Source: Ambulatory Visit | Attending: Family Medicine | Admitting: Family Medicine

## 2013-08-05 DIAGNOSIS — M19049 Primary osteoarthritis, unspecified hand: Secondary | ICD-10-CM | POA: Insufficient documentation

## 2013-08-05 DIAGNOSIS — M19041 Primary osteoarthritis, right hand: Secondary | ICD-10-CM

## 2013-08-05 DIAGNOSIS — M25449 Effusion, unspecified hand: Secondary | ICD-10-CM | POA: Insufficient documentation

## 2013-08-05 DIAGNOSIS — M25549 Pain in joints of unspecified hand: Secondary | ICD-10-CM | POA: Insufficient documentation

## 2013-08-08 ENCOUNTER — Telehealth: Payer: Self-pay | Admitting: Family Medicine

## 2013-08-08 NOTE — Telephone Encounter (Signed)
Noted in referral.

## 2013-08-08 NOTE — Telephone Encounter (Signed)
Patient called today and said that she prefers to have a specialist in [place] for the osteoarthritis for her hand because she can get around better there.

## 2013-09-05 ENCOUNTER — Other Ambulatory Visit: Payer: Self-pay | Admitting: Family Medicine

## 2013-09-05 DIAGNOSIS — Z1231 Encounter for screening mammogram for malignant neoplasm of breast: Secondary | ICD-10-CM

## 2013-09-23 ENCOUNTER — Ambulatory Visit (HOSPITAL_COMMUNITY)
Admission: RE | Admit: 2013-09-23 | Discharge: 2013-09-23 | Disposition: A | Discharge status: Home / Self Care | Payer: 59 | Source: Ambulatory Visit | Attending: Family Medicine | Admitting: Family Medicine

## 2013-09-23 DIAGNOSIS — Z1231 Encounter for screening mammogram for malignant neoplasm of breast: Secondary | ICD-10-CM | POA: Insufficient documentation

## 2013-10-28 ENCOUNTER — Ambulatory Visit (HOSPITAL_COMMUNITY)
Admission: RE | Admit: 2013-10-28 | Discharge: 2013-10-28 | Disposition: A | Discharge status: Home / Self Care | Payer: 59 | Source: Ambulatory Visit | Attending: Unknown Physician Specialty | Admitting: Unknown Physician Specialty

## 2013-10-28 DIAGNOSIS — M629 Disorder of muscle, unspecified: Secondary | ICD-10-CM | POA: Insufficient documentation

## 2013-10-28 DIAGNOSIS — M2420 Disorder of ligament, unspecified site: Secondary | ICD-10-CM | POA: Insufficient documentation

## 2013-10-28 DIAGNOSIS — M25649 Stiffness of unspecified hand, not elsewhere classified: Secondary | ICD-10-CM | POA: Insufficient documentation

## 2013-10-28 DIAGNOSIS — R279 Unspecified lack of coordination: Secondary | ICD-10-CM | POA: Insufficient documentation

## 2013-10-28 DIAGNOSIS — I10 Essential (primary) hypertension: Secondary | ICD-10-CM | POA: Insufficient documentation

## 2013-10-28 DIAGNOSIS — M6281 Muscle weakness (generalized): Secondary | ICD-10-CM

## 2013-10-28 DIAGNOSIS — M25549 Pain in joints of unspecified hand: Secondary | ICD-10-CM | POA: Insufficient documentation

## 2013-10-28 DIAGNOSIS — IMO0001 Reserved for inherently not codable concepts without codable children: Secondary | ICD-10-CM | POA: Insufficient documentation

## 2013-10-28 NOTE — Evaluation (Addendum)
Occupational Therapy Evaluation  Patient Details  Name: Aimee Dixon MRN: [id number] Date of Birth: 01/27/1951  Today's Date: 10/28/2013 Time: 1020-1057 OT Time Calculation (min): 37 min OT eval 1020-1057 37'  Visit#: 1 of 16  Re-eval: 11/25/13  Assessment Diagnosis: Bilateral hand pain Prior Therapy: None  Authorization:    Authorization Time Period:    Authorization Visit#:   of     Past Medical History:  Past Medical History  Diagnosis Date  . Hypertension   . Tubular adenoma of colon   . Hyperlipidemia   . IDA (iron deficiency anemia)    Past Surgical History:  Past Surgical History  Procedure Laterality Date  . Colonoscopy  06/30/2003    CVE:LFYB papilla/Otherwise,normal rectum/Pedunculated polyp at middescending colon/Smaller pedunculated polyp at hepatic flexure, cold snared  . Colonoscopy   08/07/2006    OFB:PZWCHEN internal hemorrhoids, otherwise normal rectum/Diminutive ascending colon adenomatous polyps  . Colonoscopy  08/03/2009    RMR: tubular adenomas removed from hepatic flexure & desc colon, internal hemorrhoids and anal papilla otherwise norma;  . Abdominal hysterectomy    . Colonoscopy N/A 10/03/2012    Procedure: COLONOSCOPY;  Surgeon: Daneil Dolin, MD;  Location: [facility];  Service: Endoscopy;  Laterality: N/A;  2:15    Subjective Symptoms/Limitations Symptoms: S: I have osteoarthritis in both my hands. The right hand is worse than the left.  Pertinent History: About 1 year ago, Aimee Dixon began to experience increase stiffness and soreness in bilateral hands. Patient presents today with increase edema in Bil hands as well. Patient states that she OA in Bilateral hands. Dr. Jefm Bryant has referred patient to occupational therapy for evaluation and treatment.  Special Tests: FOTO score: 52/100 Patient Stated Goals: To decrease pain.  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 Dominant Hand: Right Vision - History Baseline Vision: Wears glasses all the time  Cognition/Observation Cognition Overall Cognitive Status: Within Functional Limits for tasks assessed Arousal/Alertness: Lethargic Orientation Level: Oriented X4  Sensation/Coordination/Edema Coordination 9 Hole Peg Test: R: 23.5" L: 19.9" Edema Edema: Left: MCPJ: 19 cm, Wrist: 17 cm . Right: MCPJ: 19.5 cm, Wrist: 18 cm  Additional Assessments RUE AROM (degrees) RUE Overall AROM Comments: Thumb: MCP: 50, DIP: 54. Pointer: DIP: 6, PIP: 64, MCP: 74. Middle: DIP: 20, PIP: 68, MCP: 62. Ring: DIP: 26, PIP: 42, MCP: 70. Small: DIP: 64, PIP: 40, MCP: 50 Right Forearm Pronation:  (WNL) Right Forearm Supination:  (WNL) Right Wrist Extension: 60 Degrees Right Wrist Flexion: 70 Degrees Right Composite Finger Extension: 75% Right Composite Finger Flexion: 50% Right Thumb Opposition: Digit 5 RUE Strength Right Wrist Flexion: 4/5 Right Wrist Extension: 4/5 Grip (lbs): 41 Lateral Pinch: 15 lbs 3 Point Pinch: 14 lbs LUE AROM (degrees) LUE Overall AROM Comments: Thumb: MCP: 40, DIP: 60. Pointer: DIP: 42, PIP: 72, MCP: 64. Middle: DIP: 50, PIP, MCP: 50. Ring: DIP: 62, PIP: 80, MCP: 50. Small: DIP: 64, PIP: 50, MCP: 62  Left Forearm Pronation:  (WNL) Left Forearm Supination:  (WNL) Left  Wrist Extension: 70 Degrees Left Wrist Flexion: 72 Degrees LUE Strength Left Wrist Flexion: 4/5 Left Wrist Extension: 4/5 Grip (lbs): 50 Lateral Pinch: 18 lbs 3 Point Pinch: 15 lbs Palpation Palpation: Max fascial restrictions in bilateral hands from digits to wrist region.     Occupational Therapy Assessment and Plan OT Assessment and Plan Clinical Impression Statement: A: Patient is a 63 y/o female s/p bilateral hand pain presenting to occupational therapy with increase pain, decreased joint mobility, and increased fascial restrictions causing difficulty completing B/IADL, work and leisure tasks.  Pt will benefit from skilled therapeutic intervention in order to improve on the following deficits: Increased fascial restricitons;Pain;Impaired UE functional use;Decreased strength;Decreased range of motion;Increased edema Rehab Potential: Excellent OT Frequency: Min 2X/week OT Treatment/Interventions: Self-care/ADL training;Modalities;Therapeutic exercise;DME and/or AE instruction;Manual therapy;Patient/family education;Therapeutic activities OT Plan: P: Skilled OT services required to increase functional performance during B/IADL tasks using bilateral hands. Treatment plan: Myofascial release, passive stretching, PROM, AROM, general strengthening exercses, grip and piinch strengthening. Progress as tolerated.    Goals Short Term Goals Time to Complete Short Term Goals: 4 weeks Short Term Goal 1: Patient will be educated on HEP. Short Term Goal 2: Patient will report a pain score of 3/10 with daily tasks.  Short Term Goal 3: Patient will increase PROM bilateral hands by at least 5 degrees where needed to increase ability to pick up small items.  Short Term Goal 4: Patient will increase grip strength by 3# and pinch strength by 2# to increase ability to hold onto items. Short Term Goal 5: Patient will decrease edema by being educated on edema management techniques.  Additional Short Term  Goals?: Yes Short Term Goal 6: Patient will decrease fascial restrictions in bilateral hands from max to mod amount.  Long Term Goals Time to Complete Long Term Goals: 8 weeks Long Term Goal 1: Patient will return to highest level of independence with all B/IADL, leisure, and work tasks.  Long Term Goal 2: Patient will report a pain score of 1/10 or less with daily tasks.  Long Term Goal 3: Patient will increase AROM in bilateral hands to WNL to increase the ability to complete fine motor tasks.  Long Term Goal 4: Patient will increase grip strength by 5# and pinch strength by 3# to increase ability to hold onto items.  Long Term Goal 5: Patient will decrease fascial restrictions from a mod to a min amount.   Problem List Patient Active Problem List   Diagnosis Date Noted  . Pain in joint, hand 10/28/2013  . Muscle weakness (generalized) 10/28/2013  . Lack of coordination 10/28/2013  . Prediabetes 08/02/2013  . HTN (hypertension), benign 08/02/2013  . Hyperlipidemia 08/02/2013  . Arthralgia of hand 08/02/2013  . Tubular adenoma of colon   . KNEE, ARTHRITIS, DEGEN./OSTEO 12/05/2007    End of Session Activity Tolerance: Patient tolerated treatment well General Behavior During Therapy: WFL for tasks assessed/performed OT Plan of Care OT Home Exercise Plan: fine motor coordination exercises, AROM hand, edema management (contrast bath). OT Patient Instructions: Handout (scanned) Consulted and Agree with Plan of Care: Patient   Ailene Ravel, OTR/L,CBIS   10/28/2013, 12:06 PM  Physician Documentation Your signature is required to indicate approval of the treatment plan as stated above.  Please sign and either send electronically or make a copy of this report for your files and return this physician signed original.  Please mark one 1.__approve of plan  2. ___approve of plan with the following conditions.   ______________________________                                                           _____________________ Physician Signature                                                                                                             Date

## 2013-10-31 ENCOUNTER — Ambulatory Visit (HOSPITAL_COMMUNITY)
Admission: RE | Admit: 2013-10-31 | Discharge: 2013-10-31 | Disposition: A | Discharge status: Home / Self Care | Payer: 59 | Source: Ambulatory Visit | Attending: Family Medicine | Admitting: Family Medicine

## 2013-10-31 NOTE — Progress Notes (Signed)
Occupational Therapy Treatment Patient Details  Name: Aimee Dixon MRN: [id number] Date of Birth: 03-Nov-1950  Today's Date: 10/31/2013 Time: 1350-1430 OT Time Calculation (min): 40 min Manual 1350-1405 (15') Self-Care 1405-1415 (10') There-Ex 1415-1430 (15')  Visit#: 2 of 16  Re-eval: 11/25/13    Authorization:    Authorization Time Period:    Authorization Visit#: 2 of    Subjective Symptoms/Limitations Symptoms: "They're stiff today. i didn't ahve time to do my exercises this morning, but I did them yesterday and Tuesday." Pain Assessment Currently in Pain?: Yes Pain Score: 5  Pain Location: Hand Pain Orientation: Right Pain Type: Acute pain  Precautions/Restrictions     Exercise/Treatments Hand Exercises MCPJ Flexion: PROM;AROM;Both;10 reps MCPJ Extension: PROM;AROM;Both;10 reps PIPJ Flexion: PROM;AROM;Both;10 reps PIPJ Extension: PROM;AROM;Both;10 reps DIPJ Flexion: PROM;AROM;Both;10 reps DIPJ Extension: PROM;AROM;Both;10 reps Joint Blocking Exercises: On right hand, join blocking for each 2nd-5th at DIP, PIP, and MCP joints. 10 reps each. Sponges: right hand - reaching 2nd and 4th digits to yellow .5 inch sponge on palm, and reaching 3rd and 5th digits to surface of plam. 10 reps each.     Manual Therapy Manual Therapy: Myofascial release Myofascial Release: MFR and edema management to DIP, PIP, MCP, and plmar regions to decrease fascial restrictions and edema and improve mobility. Activities of Daily Living Activities of Daily Living: Educated pt on use of large, built-up handles to decrease stress on finger joints.  Also recommended using larger joints (wrist, elbow, shoulder) when possible to decrease stress on hands. Gave pt example of jar opener to use in [place] rather than using hand to grip jar lids to open them.   Occupational Therapy Assessment and Plan OT Assessment and Plan Clinical Impression Statement: Pt reports good use of home exercises and  goo dunderstanding of contrast bath.  Reports some decreased stiffness after using contrast bath.  Increased stiffness in RUE over LUE.  Decreased fascial restirctions with manual this session.  focused exercises on RUE stiffness this date - pt expressed surprise at ability to reach digits towards palm.  also educated pt on joint protection strategies. OT Plan: Begin session with paraffin wax treatment prior to ther-ex.  Provide pt with handout on assistive devices for arthritis. continue MFR and ther-ex to bilateral hands. Educated fruther on joint protection strategies.   Goals Short Term Goals Short Term Goal 1: Patient will be educated on HEP. Short Term Goal 1 Progress: Progressing toward goal Short Term Goal 2: Patient will report a pain score of 3/10 with daily tasks.  Short Term Goal 2 Progress: Progressing toward goal Short Term Goal 3: Patient will increase PROM bilateral hands by at least 5 degrees where needed to increase ability to pick up small items.  Short Term Goal 3 Progress: Progressing toward goal Short Term Goal 4: Patient will increase grip strength by 3# and pinch strength by 2# to increase ability to hold onto items. Short Term Goal 4 Progress: Progressing toward goal Short Term Goal 5: Patient will decrease edema by being educated on edema management techniques.  Short Term Goal 5 Progress: Progressing toward goal Additional Short Term Goals?: Yes Short Term Goal 6: Patient will decrease fascial restrictions in bilateral hands from max to mod amount.  Short Term Goal 6 Progress: Progressing toward goal Long Term Goals Long Term Goal 1: Patient will return to highest level of independence with all B/IADL, leisure, and work tasks.  Long Term Goal 1 Progress: Progressing toward goal Long Term Goal 2: Patient will report  a pain score of 1/10 or less with daily tasks.  Long Term Goal 2 Progress: Progressing toward goal Long Term Goal 3: Patient will increase AROM in  bilateral hands to WNL to increase the ability to complete fine motor tasks.  Long Term Goal 3 Progress: Progressing toward goal Long Term Goal 4: Patient will increase grip strength by 5# and pinch strength by 3# to increase ability to hold onto items.  Long Term Goal 4 Progress: Progressing toward goal Long Term Goal 5: Patient will decrease fascial restrictions from a mod to a min amount.  Long Term Goal 5 Progress: Progressing toward goal  Problem List Patient Active Problem List   Diagnosis Date Noted  . Pain in joint, hand 10/28/2013  . Muscle weakness (generalized) 10/28/2013  . Lack of coordination 10/28/2013  . Prediabetes 08/02/2013  . HTN (hypertension), benign 08/02/2013  . Hyperlipidemia 08/02/2013  . Arthralgia of hand 08/02/2013  . Tubular adenoma of colon   . KNEE, ARTHRITIS, DEGEN./OSTEO 12/05/2007    End of Session Activity Tolerance: Patient tolerated treatment well General Behavior During Therapy: [facility] for tasks assessed/performed  GO    Bea Graff, MS, OTR/L [phone]  10/31/2013, 2:55 PM

## 2013-11-04 ENCOUNTER — Ambulatory Visit (HOSPITAL_COMMUNITY)
Admission: RE | Admit: 2013-11-04 | Discharge: 2013-11-04 | Disposition: A | Discharge status: Home / Self Care | Payer: 59 | Source: Ambulatory Visit | Attending: Unknown Physician Specialty | Admitting: Unknown Physician Specialty

## 2013-11-04 NOTE — Progress Notes (Signed)
Occupational Therapy Treatment Patient Details  Name: Aimee Dixon MRN: [id number] Date of Birth: April 22, 1951  Today's Date: 11/04/2013 Time: 6063-0160 OT Time Calculation (min): 45 min Paraffin Bath with Self Care - 15 min each Manual 1093-2355 (18') Therapeutic Exercises 7322-0254 (12')  Visit#: 3 of 16  Re-eval: 11/25/13    Authorization:    Authorization Time Period:    Authorization Visit#: 3 of    Subjective Symptoms/Limitations Symptoms: I'm doing rpetty good. no pain today. Pain Assessment Currently in Pain?: No/denies  Exercise/Treatments Hand Exercises MCPJ Flexion: PROM;AROM;Both;10 reps MCPJ Extension: PROM;AROM;Both;10 reps PIPJ Flexion: PROM;AROM;Both;10 reps PIPJ Extension: PROM;AROM;Both;10 reps DIPJ Flexion: PROM;AROM;Both;10 reps DIPJ Extension: PROM;AROM;Both;10 reps Other Hand Exercises: Digit extension - MCP joint isolation. 5 reps each digit bilaterally, with hand flat on table. Other Hand Exercises: Anti-ulnar drift strenghtening - 5 reps each bilaterally bringing digits towards midline, with lift and placed after each.     Modalities Modalities: Moist Heat Manual Therapy Manual Therapy: Myofascial release Myofascial Release: MFR and edema management to DIP, PIP, MCP, and plmar regions to decrease fascial restrictions and edema and improve mobility.   Moist Heat Therapy Number Minutes Moist Heat: 15 minutes  (bilateral paraffin wax to hands with moist heat packs) Moist Heat Location: Hand Activities of Daily Living Activities of Daily Living: While in paraffin, educated pt on joint protection principles (including the use of modalitis, using larger joints, enlarging grips, resting, and using leverage) and adaptive equipment options (including built-up foam and adaptive handles).  Provided handout (scanned).  Occupational Therapy Assessment and Plan OT Assessment and Plan Clinical Impression Statement: Pt reported trying contrast bath on Sunday,  with good results and preferring the heat over the ice.  Added paraffin bath this session - pt had good results and verbalized decreased pain.  pt has some improved stretching in 2nd and 4th right digits after paraffin.  Educated on joint protection strategies, and pt verbalized good understanding. OT Plan: Follow up on paraggin wax treatment - continue prior to ther-ex.  continue MFR and ther-ex (joint blocking, pushing into sponge/putty in palm with digits) to bilateral hands.   Goals Short Term Goals Short Term Goal 1: Patient will be educated on HEP. Short Term Goal 1 Progress: Progressing toward goal Short Term Goal 2: Patient will report a pain score of 3/10 with daily tasks.  Short Term Goal 3: Patient will increase PROM bilateral hands by at least 5 degrees where needed to increase ability to pick up small items.  Short Term Goal 3 Progress: Progressing toward goal Short Term Goal 4: Patient will increase grip strength by 3# and pinch strength by 2# to increase ability to hold onto items. Short Term Goal 4 Progress: Progressing toward goal Short Term Goal 5: Patient will decrease edema by being educated on edema management techniques.  Short Term Goal 5 Progress: Progressing toward goal Short Term Goal 6: Patient will decrease fascial restrictions in bilateral hands from max to mod amount.  Short Term Goal 6 Progress: Progressing toward goal Long Term Goals Long Term Goal 1: Patient will return to highest level of independence with all B/IADL, leisure, and work tasks.  Long Term Goal 1 Progress: Progressing toward goal Long Term Goal 2: Patient will report a pain score of 1/10 or less with daily tasks.  Long Term Goal 2 Progress: Progressing toward goal Long Term Goal 3: Patient will increase AROM in bilateral hands to WNL to increase the ability to complete fine motor tasks.  Long Term  Goal 3 Progress: Progressing toward goal Long Term Goal 4: Patient will increase grip strength by 5#  and pinch strength by 3# to increase ability to hold onto items.  Long Term Goal 4 Progress: Progressing toward goal Long Term Goal 5: Patient will decrease fascial restrictions from a mod to a min amount.  Long Term Goal 5 Progress: Progressing toward goal  Problem List Patient Active Problem List   Diagnosis Date Noted  . Pain in joint, hand 10/28/2013  . Muscle weakness (generalized) 10/28/2013  . Lack of coordination 10/28/2013  . Prediabetes 08/02/2013  . HTN (hypertension), benign 08/02/2013  . Hyperlipidemia 08/02/2013  . Arthralgia of hand 08/02/2013  . Tubular adenoma of colon   . KNEE, ARTHRITIS, DEGEN./OSTEO 12/05/2007    End of Session Activity Tolerance: Patient tolerated treatment well General Behavior During Therapy: [facility] for tasks assessed/performed  GO    Bea Graff, [place], OTR/L [phone]  11/04/2013, 2:51 PM

## 2013-11-07 ENCOUNTER — Ambulatory Visit (HOSPITAL_COMMUNITY)
Admission: RE | Admit: 2013-11-07 | Discharge: 2013-11-07 | Disposition: A | Discharge status: Home / Self Care | Payer: 59 | Source: Ambulatory Visit | Attending: Family Medicine | Admitting: Family Medicine

## 2013-11-07 NOTE — Progress Notes (Signed)
Occupational Therapy Treatment Patient Details  Name: Aimee Dixon MRN: [id number] Date of Birth: 06/07/1950  Today's Date: 11/07/2013 Time: 1349-1430 OT Time Calculation (min): 41 min Paraffin 3244-0102 15' MFR 7253-6644 18' Theract 1423-1430 8'  Visit#: 4 of 16  Re-eval: 11/25/13    Subjective Symptoms/Limitations Symptoms: S: My hands are feeling good today. Pain Assessment Currently in Pain?: Yes Pain Score: 3  Pain Location: Hand (thumb) Pain Orientation: Right Pain Type: Acute pain  Precautions/Restrictions  Precautions Precautions: None  Exercise/Treatments Hand Exercises MCPJ Flexion: PROM;10 reps MCPJ Extension: PROM;10 reps PIPJ Flexion: PROM;10 reps PIPJ Extension: PROM;10 reps DIPJ Flexion: PROM;AROM;10 reps DIPJ Extension: PROM;AROM;10 reps Theraputty - Roll: red Theraputty - Grip: red     Manual Therapy Manual Therapy: Myofascial release Myofascial Release: MFR and edema management to DIP, PIP, MCP, and palmer regions to decrease fascial restrictions and edema and improve mobility in a pain free zone.  Occupational Therapy Assessment and Plan OT Assessment and Plan Clinical Impression Statement: A: Began treatment with paraffin dip this session followed by myofascial release (MFR) and passive stretching to bilateral hands. Patient had good results with MFR and passive stretching. Minimal pain felt during passive stretching. Patient complete theraputty and was given a sample for use at home with HEP. OT Plan: P: Grip strengthening and pinch strength exercises.    Goals Short Term Goals Short Term Goal 1: Patient will be educated on HEP. Short Term Goal 2: Patient will report a pain score of 3/10 with daily tasks.  Short Term Goal 3: Patient will increase PROM bilateral hands by at least 5 degrees where needed to increase ability to pick up small items.  Short Term Goal 4: Patient will increase grip strength by 3# and pinch strength by 2# to  increase ability to hold onto items. Short Term Goal 5: Patient will decrease edema by being educated on edema management techniques.  Short Term Goal 6: Patient will decrease fascial restrictions in bilateral hands from max to mod amount.  Long Term Goals Long Term Goal 1: Patient will return to highest level of independence with all B/IADL, leisure, and work tasks.  Long Term Goal 2: Patient will report a pain score of 1/10 or less with daily tasks.  Long Term Goal 3: Patient will increase AROM in bilateral hands to WNL to increase the ability to complete fine motor tasks.  Long Term Goal 4: Patient will increase grip strength by 5# and pinch strength by 3# to increase ability to hold onto items.  Long Term Goal 5: Patient will decrease fascial restrictions from a mod to a min amount.   Problem List Patient Active Problem List   Diagnosis Date Noted  . Pain in joint, hand 10/28/2013  . Muscle weakness (generalized) 10/28/2013  . Lack of coordination 10/28/2013  . Prediabetes 08/02/2013  . HTN (hypertension), benign 08/02/2013  . Hyperlipidemia 08/02/2013  . Arthralgia of hand 08/02/2013  . Tubular adenoma of colon   . KNEE, ARTHRITIS, DEGEN./OSTEO 12/05/2007    End of Session Activity Tolerance: Patient tolerated treatment well General Behavior During Therapy: [facility] for tasks assessed/performed   Ailene Ravel, OTR/L,CBIS   11/07/2013, 2:37 PM

## 2013-11-11 ENCOUNTER — Ambulatory Visit (HOSPITAL_COMMUNITY)
Admission: RE | Admit: 2013-11-11 | Discharge: 2013-11-11 | Disposition: A | Discharge status: Home / Self Care | Payer: 59 | Source: Ambulatory Visit | Attending: Family Medicine | Admitting: Family Medicine

## 2013-11-11 NOTE — Progress Notes (Signed)
Occupational Therapy Treatment Patient Details  Name: Aimee Dixon MRN: [id number] Date of Birth: 03/18/1951  Today's Date: 11/11/2013 Time: 1346-1430 OT Time Calculation (min): 44 min Paraffin 1346-1401 15' MFR 4193-7902 15' Theract 1416-1430 14'  Visit#: 5 of 16  Re-eval: 11/25/13    Authorization:    Authorization Time Period:    Authorization Visit#:   of    Subjective Symptoms/Limitations Symptoms: S: My daughter told me the other day that she could definitely tell a difference in my hands. They look much better.  Pain Assessment Currently in Pain?: No/denies  Precautions/Restrictions  Precautions Precautions: None  Exercise/Treatments Hand Exercises MCPJ Flexion: PROM;10 reps MCPJ Extension: PROM;10 reps PIPJ Flexion: PROM;10 reps PIPJ Extension: PROM;10 reps DIPJ Flexion: PROM;AROM;10 reps DIPJ Extension: PROM;AROM;10 reps Hand Gripper with Large Beads: 10 beads Hand Gripper with Medium Beads: 2 beads Other Hand Exercises: resistive clothespins; all on/off. Placed with right hand and removed with left hand.  Other Hand Exercises: in hand manipulation task with coins using right hand to pick up one at a time (5) then stack them on table top.      Modalities Modalities:  (Paraffin completed to bilateral hands to decrease stiffness; 15') Manual Therapy Manual Therapy: Myofascial release Myofascial Release: MFR and edema management to DIP, PIP, MCP, and palmer regions to decrease fascial restrictions and edema and improve mobility in a pain free zone  Occupational Therapy Assessment and Plan OT Assessment and Plan Clinical Impression Statement: A: pt had the most difficulty with handgripper and medium beads this date. Complained of decreased strength and pain. Added additional grip strengthening exercises. Patient tolerated well. Left hand presents with great joint mobility this date.  OT Plan: P: Complete myofascial release PRN. Cont to work on increasing  joint mobility of pointer and ring finger of right hand.    Goals Short Term Goals Time to Complete Short Term Goals: 4 weeks Short Term Goal 1: Patient will be educated on HEP. Short Term Goal 1 Progress: Progressing toward goal Short Term Goal 2: Patient will report a pain score of 3/10 with daily tasks.  Short Term Goal 2 Progress: Progressing toward goal Short Term Goal 3: Patient will increase PROM bilateral hands by at least 5 degrees where needed to increase ability to pick up small items.  Short Term Goal 3 Progress: Progressing toward goal Short Term Goal 4: Patient will increase grip strength by 3# and pinch strength by 2# to increase ability to hold onto items. Short Term Goal 4 Progress: Progressing toward goal Short Term Goal 5: Patient will decrease edema by being educated on edema management techniques.  Short Term Goal 5 Progress: Progressing toward goal Short Term Goal 6: Patient will decrease fascial restrictions in bilateral hands from max to mod amount.  Short Term Goal 6 Progress: Progressing toward goal Long Term Goals Time to Complete Long Term Goals: 8 weeks Long Term Goal 1: Patient will return to highest level of independence with all B/IADL, leisure, and work tasks.  Long Term Goal 1 Progress: Progressing toward goal Long Term Goal 2: Patient will report a pain score of 1/10 or less with daily tasks.  Long Term Goal 2 Progress: Progressing toward goal Long Term Goal 3: Patient will increase AROM in bilateral hands to WNL to increase the ability to complete fine motor tasks.  Long Term Goal 3 Progress: Progressing toward goal Long Term Goal 4: Patient will increase grip strength by 5# and pinch strength by 3# to increase ability to  hold onto items.  Long Term Goal 4 Progress: Progressing toward goal Long Term Goal 5: Patient will decrease fascial restrictions from a mod to a min amount.  Long Term Goal 5 Progress: Progressing toward goal  Problem List Patient  Active Problem List   Diagnosis Date Noted  . Pain in joint, hand 10/28/2013  . Muscle weakness (generalized) 10/28/2013  . Lack of coordination 10/28/2013  . Prediabetes 08/02/2013  . HTN (hypertension), benign 08/02/2013  . Hyperlipidemia 08/02/2013  . Arthralgia of hand 08/02/2013  . Tubular adenoma of colon   . KNEE, ARTHRITIS, DEGEN./OSTEO 12/05/2007    End of Session Activity Tolerance: Patient tolerated treatment well General Behavior During Therapy: [facility] for tasks assessed/performed   Ailene Ravel, OTR/L,CBIS   11/11/2013, 2:31 PM

## 2013-11-14 ENCOUNTER — Ambulatory Visit (HOSPITAL_COMMUNITY)
Admission: RE | Admit: 2013-11-14 | Discharge: 2013-11-14 | Disposition: A | Discharge status: Home / Self Care | Payer: 59 | Source: Ambulatory Visit | Attending: Family Medicine | Admitting: Family Medicine

## 2013-11-14 NOTE — Progress Notes (Signed)
Occupational Therapy Treatment Patient Details  Name: Aimee Dixon MRN: [id number] Date of Birth: July 18, 1950  Today's Date: 11/14/2013 Time: 7482-7078 OT Time Calculation (min): 43 min Paraffin 6754-4920 10' Therex 1007-1219 33'  Visit#: 6 of 16  Re-eval: 11/25/13     Subjective Symptoms/Limitations Symptoms: S: My hands don't hurt they are just stiff from all the housework I've been doing.  Pain Assessment Currently in Pain?: No/denies  Precautions/Restrictions  Precautions Precautions: None  Exercise/Treatments Hand Exercises MCPJ Flexion: PROM;10 reps MCPJ Extension: PROM;10 reps PIPJ Flexion: PROM;10 reps PIPJ Extension: PROM;10 reps DIPJ Flexion: PROM;AROM;10 reps DIPJ Extension: PROM;AROM;10 reps Theraputty - Roll: red Theraputty - Grip: red Theraputty - Pinch: red Theraputty - Locate Pegs: 5 beads with each hand individually - red     Modalities Modalities:  (Paraffin Wax to bilateral hands to decrease stiffness)  Occupational Therapy Assessment and Plan OT Assessment and Plan Clinical Impression Statement: A: Right hand pointer and ring finger PIP joints continue to appear swollen; probably due to arhtritis. Limited joint mobility at PIP joints (Rt hand pointer and ring). Added additional theraputty exercises.  OT Plan: P: Completed myofascial release PRN. grip and pinch strengthening tasks   Goals Short Term Goals Time to Complete Short Term Goals: 4 weeks Short Term Goal 1: Patient will be educated on HEP. Short Term Goal 2: Patient will report a pain score of 3/10 with daily tasks.  Short Term Goal 3: Patient will increase PROM bilateral hands by at least 5 degrees where needed to increase ability to pick up small items.  Short Term Goal 4: Patient will increase grip strength by 3# and pinch strength by 2# to increase ability to hold onto items. Short Term Goal 5: Patient will decrease edema by being educated on edema management techniques.  Short  Term Goal 6: Patient will decrease fascial restrictions in bilateral hands from max to mod amount.  Long Term Goals Time to Complete Long Term Goals: 8 weeks Long Term Goal 1: Patient will return to highest level of independence with all B/IADL, leisure, and work tasks.  Long Term Goal 2: Patient will report a pain score of 1/10 or less with daily tasks.  Long Term Goal 3: Patient will increase AROM in bilateral hands to WNL to increase the ability to complete fine motor tasks.  Long Term Goal 4: Patient will increase grip strength by 5# and pinch strength by 3# to increase ability to hold onto items.  Long Term Goal 5: Patient will decrease fascial restrictions from a mod to a min amount.   Problem List Patient Active Problem List   Diagnosis Date Noted  . Pain in joint, hand 10/28/2013  . Muscle weakness (generalized) 10/28/2013  . Lack of coordination 10/28/2013  . Prediabetes 08/02/2013  . HTN (hypertension), benign 08/02/2013  . Hyperlipidemia 08/02/2013  . Arthralgia of hand 08/02/2013  . Tubular adenoma of colon   . KNEE, ARTHRITIS, DEGEN./OSTEO 12/05/2007    End of Session Activity Tolerance: Patient tolerated treatment well General Behavior During Therapy: [facility] for tasks assessed/performed   Ailene Ravel, OTR/L,CBIS   11/14/2013, 2:37 PM

## 2013-11-18 ENCOUNTER — Ambulatory Visit (HOSPITAL_COMMUNITY)
Admission: RE | Admit: 2013-11-18 | Discharge: 2013-11-18 | Disposition: A | Discharge status: Home / Self Care | Payer: 59 | Source: Ambulatory Visit | Attending: Unknown Physician Specialty | Admitting: Unknown Physician Specialty

## 2013-11-18 NOTE — Progress Notes (Signed)
Occupational Therapy Treatment Patient Details  Name: Aimee Dixon MRN: [id number] Date of Birth: Jul 16, 1950  Today's Date: 11/18/2013 Time: 0814-4818 OT Time Calculation (min): 42 min Paraffin 5631-4970 10' Theract 1358-1430 32'  Visit#: 7 of 16  Re-eval: 11/25/13    Subjective Symptoms/Limitations Symptoms: S: My hands don't hurt and they are less stiff than they have been.  Pain Assessment Currently in Pain?: No/denies  Precautions/Restrictions  Precautions Precautions: None  Exercise/Treatments Hand Exercises Digit Composite Abduction: AROM;10 reps Digit Composite Adduction: AROM;10 reps Theraputty - Flatten: green Theraputty - Roll: green Theraputty - Grip: green Hand Gripper with Medium Beads: 16 beads with black handgripper; separated by both hands Sponges: R:33 L:33     Modalities Modalities:  (Paraffin to bilateral hands to decrease stiffness)  Occupational Therapy Assessment and Plan OT Assessment and Plan Clinical Impression Statement: A: Increased theraputty to green this date. Patient tolerated well.  OT Plan: P: Reassess for possible D/C.   Goals Short Term Goals Time to Complete Short Term Goals: 4 weeks Short Term Goal 1: Patient will be educated on HEP. Short Term Goal 1 Progress: Progressing toward goal Short Term Goal 2: Patient will report a pain score of 3/10 with daily tasks.  Short Term Goal 2 Progress: Progressing toward goal Short Term Goal 3: Patient will increase PROM bilateral hands by at least 5 degrees where needed to increase ability to pick up small items.  Short Term Goal 3 Progress: Progressing toward goal Short Term Goal 4: Patient will increase grip strength by 3# and pinch strength by 2# to increase ability to hold onto items. Short Term Goal 4 Progress: Progressing toward goal Short Term Goal 5: Patient will decrease edema by being educated on edema management techniques.  Short Term Goal 5 Progress: Progressing toward  goal Short Term Goal 6: Patient will decrease fascial restrictions in bilateral hands from max to mod amount.  Short Term Goal 6 Progress: Progressing toward goal Long Term Goals Time to Complete Long Term Goals: 8 weeks Long Term Goal 1: Patient will return to highest level of independence with all B/IADL, leisure, and work tasks.  Long Term Goal 1 Progress: Progressing toward goal Long Term Goal 2: Patient will report a pain score of 1/10 or less with daily tasks.  Long Term Goal 2 Progress: Progressing toward goal Long Term Goal 3: Patient will increase AROM in bilateral hands to WNL to increase the ability to complete fine motor tasks.  Long Term Goal 3 Progress: Progressing toward goal Long Term Goal 4: Patient will increase grip strength by 5# and pinch strength by 3# to increase ability to hold onto items.  Long Term Goal 4 Progress: Progressing toward goal Long Term Goal 5: Patient will decrease fascial restrictions from a mod to a min amount.  Long Term Goal 5 Progress: Progressing toward goal  Problem List Patient Active Problem List   Diagnosis Date Noted  . Pain in joint, hand 10/28/2013  . Muscle weakness (generalized) 10/28/2013  . Lack of coordination 10/28/2013  . Prediabetes 08/02/2013  . HTN (hypertension), benign 08/02/2013  . Hyperlipidemia 08/02/2013  . Arthralgia of hand 08/02/2013  . Tubular adenoma of colon   . KNEE, ARTHRITIS, DEGEN./OSTEO 12/05/2007    End of Session Activity Tolerance: Patient tolerated treatment well General Behavior During Therapy: [facility] for tasks assessed/performed   Ailene Ravel, OTR/L,CBIS   11/18/2013, 2:53 PM

## 2013-11-21 ENCOUNTER — Ambulatory Visit (HOSPITAL_COMMUNITY)
Admission: RE | Admit: 2013-11-21 | Discharge: 2013-11-21 | Disposition: A | Discharge status: Home / Self Care | Payer: 59 | Source: Ambulatory Visit | Attending: Family Medicine | Admitting: Family Medicine

## 2013-11-21 NOTE — Evaluation (Signed)
Occupational Therapy Re-Evaluation and Discharge  Patient Details  Name: Aimee Dixon MRN: [id number] Date of Birth: September 25, 1950  Today's Date: 11/21/2013 Time: 8937-3428 OT Time Calculation (min): 58 min Paraffin 1337-1347 (10') ROM Measurements 1347-1420 (29') Self Care: 7681-1572 (107')  Visit#: 8 of 16  Re-eval:    Assessment Diagnosis: Bilateral hand pain  Authorization:    Authorization Time Period:    Authorization Visit#:   of     Past Medical History:  Past Medical History  Diagnosis Date  . Hypertension   . Tubular adenoma of colon   . Hyperlipidemia   . IDA (iron deficiency anemia)    Past Surgical History:  Past Surgical History  Procedure Laterality Date  . Colonoscopy  06/30/2003    IOM:BTDH papilla/Otherwise,normal rectum/Pedunculated polyp at middescending colon/Smaller pedunculated polyp at hepatic flexure, cold snared  . Colonoscopy   08/07/2006    RCB:ULAGTXM internal hemorrhoids, otherwise normal rectum/Diminutive ascending colon adenomatous polyps  . Colonoscopy  08/03/2009    RMR: tubular adenomas removed from hepatic flexure & desc colon, internal hemorrhoids and anal papilla otherwise norma;  . Abdominal hysterectomy    . Colonoscopy N/A 10/03/2012    Procedure: COLONOSCOPY;  Surgeon: Daneil Dolin, MD;  Location: [facility];  Service: Endoscopy;  Laterality: N/A;  2:15    Subjective Symptoms/Limitations Symptoms: "there's just a little stifnss in those two fingers that are bad.  the swelling's gone done. and the ability to close my hand has gotten better." Special Tests: FOTO score: current 77/100, Initial 52/100 Pain Assessment Currently in Pain?: No/denies   Assessment ADL/Vision/Perception ADL ADL Comments: No difficulty with writing.  Improved abilites with fine motor tasks.  No sifficulty noted with buttons, and no pain.  Sensation/Coordination/Edema Coordination 9 Hole Peg Test: R:  15.43  L: 18.09 (R: 23.5" L:  19.9") Edema Edema: Curren Left: MCP 19cm, Wrist: 17cm  right MCP:  20cm  Wrist: 17cm       Initial-   Left: MCPJ: 19 cm, Wrist: 17 cm . Right: MCPJ: 19.5 cm, Wrist: 18 cm  Additional Assessments RUE AROM (degrees) RUE Overall AROM Comments: Current Thumb: MCP- 52, DIP 56   2nd Digit MCP-70, PIP-61, DIP-18   third digit MCP-67, PIP-76, DIP-43    fourth digit MCP-73, PIP-59, DIP-32    Fifth Digit MCP-74, PIP- 58, DIP-62      Thumb: MCP: 50, DIP: 54. Pointer: DIP: 6, PIP: 64, MCP: 74. Middle: DIP: 20, PIP: 68, MCP: 62. Ring: DIP: 26, PIP: 42, MCP: 70. Small: DIP: 64, PIP: 40, MCP: 50 Right Wrist Extension: 72 Degrees (Previous 60) Right Wrist Flexion: 90 Degrees (previous 70) Right Composite Finger Extension:  (WFL) Right Composite Finger Flexion: 75% (with 2nd and 4th digits.  full with others.  Previous 50%) Right Thumb Opposition: Digit 5 RUE Strength Right Wrist Flexion: 5/5 (4/5) Right Wrist Extension: 5/5 (4/5) Grip (lbs): 46 (41) Lateral Pinch: 15 lbs (15) 3 Point Pinch: 14 lbs (14) LUE AROM (degrees) LUE Overall AROM Comments: Current thumb MCP-42, PIP 77, 2nd digit MCP-74, PIP-87 DIP- 52    Thrid digit MPC-63, PIP- 90, DIP-60    fourth Digit MCP- 75, PIP- 85, DIP- 57    Fifth digit MCP- 56, PIP- 73, DIP- 62      Previous:Thumb: MCP: 40, DIP: 60. Pointer: DIP: 42, PIP: 72, MCP: 64. Middle: DIP: 50, PIP, MCP: 50. Ring: DIP: 62, PIP: 80, MCP: 50. Small: DIP: 64, PIP: 50, MCP: 62 Left Wrist Extension: 82 Degrees (  70) Left Wrist Flexion: 84 Degrees (72) LUE Strength Left Wrist Flexion: 5/5 (4/5) Left Wrist Extension: 5/5 (4/5) Grip (lbs): 53 (50) Lateral Pinch: 17 lbs (18) 3 Point Pinch: 14 lbs (15) Palpation Palpation: Trace fascial restrictions noted in bilateral 4th digits between PIP and MCP.  No other fascial restrictions noted. Right Hand Strength - Pinch (lbs) Lateral Pinch: 15 lbs (15) 3 Point Pinch: 14 lbs (14) Left Hand Strength - Pinch (lbs) Lateral Pinch: 17 lbs  (18) 3 Point Pinch: 14 lbs (15)   Occupational Therapy Assessment and Plan OT Assessment and Plan Clinical Impression Statement: Re-Assessment completed this session. Pt has verbalized significant improvements in edema, stiffness, pain, and use of bilateral hand since beginning OT services.  She has met 5/6 STG and 3/5 LTG.  Her remaining goals are partially met, and pt feels she will be able to continue wroking on her hand strength and the last of her left digit flexion range at home.  Pt has also verbalized looking into a paraffin bath for home use.  Pt is comfortable with her current bilateral hand use and her HEP. OT Plan: pt is discharged from OT service with HEP   Goals Short Term Goals Short Term Goal 1: Patient will be educated on HEP. Short Term Goal 1 Progress: Met Short Term Goal 2: Patient will report a pain score of 3/10 with daily tasks.  Short Term Goal 2 Progress: Met Short Term Goal 3: Patient will increase PROM bilateral hands by at least 5 degrees where needed to increase ability to pick up small items.  Short Term Goal 3 Progress: Met Short Term Goal 4: Patient will increase grip strength by 3# and pinch strength by 2# to increase ability to hold onto items. Short Term Goal 4 Progress: Partly met Short Term Goal 5: Patient will decrease edema by being educated on edema management techniques.  Short Term Goal 5 Progress: Met Short Term Goal 6: Patient will decrease fascial restrictions in bilateral hands from max to mod amount.  Short Term Goal 6 Progress: Met Long Term Goals Long Term Goal 1: Patient will return to highest level of independence with all B/IADL, leisure, and work tasks.  Long Term Goal 1 Progress: Met Long Term Goal 2: Patient will report a pain score of 1/10 or less with daily tasks.  Long Term Goal 2 Progress: Met Long Term Goal 3: Patient will increase AROM in bilateral hands to WNL to increase the ability to complete fine motor tasks.  Long Term  Goal 3 Progress: Partly met Long Term Goal 4: Patient will increase grip strength by 5# and pinch strength by 3# to increase ability to hold onto items.  Long Term Goal 4 Progress: Partly met Long Term Goal 5: Patient will decrease fascial restrictions from a mod to a min amount.  Long Term Goal 5 Progress: Met  Problem List Patient Active Problem List   Diagnosis Date Noted  . Pain in joint, hand 10/28/2013  . Muscle weakness (generalized) 10/28/2013  . Lack of coordination 10/28/2013  . Prediabetes 08/02/2013  . HTN (hypertension), benign 08/02/2013  . Hyperlipidemia 08/02/2013  . Arthralgia of hand 08/02/2013  . Tubular adenoma of colon   . KNEE, ARTHRITIS, DEGEN./OSTEO 12/05/2007    End of Session Activity Tolerance: Patient tolerated treatment well General Behavior During Therapy: [facility] for tasks assessed/performed  GO Functional Assessment Tool Used: FOTO 77/100  Bea Graff, [place], OTR/L [phone]  11/21/2013, 4:24 PM  Physician Documentation Your signature  is required to indicate approval of the treatment plan as stated above.  Please sign and either send electronically or make a copy of this report for your files and return this physician signed original.  Please mark one 1.__approve of plan  2. ___approve of plan with the following conditions.   ______________________________                                                          _____________________ Physician Signature                                                                                                             Date

## 2014-08-04 ENCOUNTER — Ambulatory Visit (INDEPENDENT_AMBULATORY_CARE_PROVIDER_SITE_OTHER): Payer: BLUE CROSS/BLUE SHIELD | Admitting: Nurse Practitioner

## 2014-08-04 ENCOUNTER — Encounter: Payer: Self-pay | Admitting: Nurse Practitioner

## 2014-08-04 VITALS — BP 130/70 | Ht 63.5 in | Wt 200.2 lb

## 2014-08-04 DIAGNOSIS — Z1231 Encounter for screening mammogram for malignant neoplasm of breast: Secondary | ICD-10-CM

## 2014-08-04 DIAGNOSIS — Z0000 Encounter for general adult medical examination without abnormal findings: Secondary | ICD-10-CM

## 2014-08-04 MED ORDER — METFORMIN HCL 500 MG PO TABS: 500.0000 mg | ORAL_TABLET | Freq: Two times a day (BID) | ORAL | Status: DC

## 2014-08-04 NOTE — Patient Instructions (Signed)
Weight watchers [employer] Low GI (glycemic index)

## 2014-08-09 ENCOUNTER — Encounter: Payer: Self-pay | Admitting: Nurse Practitioner

## 2014-08-09 NOTE — Progress Notes (Addendum)
   Subjective:    Patient ID: Aimee Dixon, female    DOB: September 25, 1950, 64 y.o.   MRN: [id number]  HPI presents for her wellness exam. Had hysterectomy for bleeding. Still has ovaries. No pelvic pain. Same sexual partner. Regular vision and dental care. Gets routine labs at work. Active job. Overall healthy diet.     Review of Systems  Constitutional: Negative for fever, activity change, appetite change and fatigue.  HENT: Negative for dental problem, ear pain, sinus pressure and sore throat.   Respiratory: Negative for cough, chest tightness, shortness of breath and wheezing.   Cardiovascular: Negative for chest pain.  Gastrointestinal: Negative for nausea, vomiting, abdominal pain, diarrhea, constipation, blood in stool and abdominal distention.  Genitourinary: Negative for dysuria, urgency, frequency, vaginal discharge, enuresis, difficulty urinating, genital sores and pelvic pain.       Objective:   Physical Exam  Constitutional: She is oriented to person, place, and time. She appears well-developed. No distress.  HENT:  Right Ear: External ear normal.  Left Ear: External ear normal.  Mouth/Throat: Oropharynx is clear and moist.  Neck: Normal range of motion. Neck supple. No tracheal deviation present. No thyromegaly present.  Cardiovascular: Normal rate, regular rhythm and normal heart sounds.  Exam reveals no gallop.   No murmur heard. Pulmonary/Chest: Effort normal and breath sounds normal.  Abdominal: Soft. She exhibits no distension. There is no tenderness.  Genitourinary: Vagina normal. No vaginal discharge found.  External GU: no rashes or lesions. Bimanual exam: no tenderness or obvious masses. Rectal exam: no masses; no stool for hemoccult.   Musculoskeletal: She exhibits no edema.  Lymphadenopathy:    She has no cervical adenopathy.  Neurological: She is alert and oriented to person, place, and time.  Skin: Skin is warm and dry. No rash noted.  Psychiatric: She  has a normal mood and affect. Her behavior is normal.  Vitals reviewed. Breast exam: no masses; axillae no adenopathy. See scanned labs 03/04/14. HgbA1C 6.1.       Assessment & Plan:   Problem List Items Addressed This Visit      Other   Morbid obesity   Relevant Medications   metFORMIN (GLUCOPHAGE) tablet    Other Visit Diagnoses    Routine general medical examination at a health care facility    -  Primary    Relevant Orders    POC Hemoccult Bld/Stl (3-Cd Home Screen)    Visit for screening mammogram        Relevant Orders    MM DIGITAL SCREENING BILATERAL       Recommend healthy diet, regular activity and daily vitamin D/calcium supplement.  Return in about 6 months (around 02/04/2015) for recheck on sugar.

## 2014-08-11 ENCOUNTER — Other Ambulatory Visit: Payer: Self-pay | Admitting: Family Medicine

## 2014-08-14 LAB — POC HEMOCCULT BLD/STL (HOME/3-CARD/SCREEN)
FECAL OCCULT BLD: NEGATIVE
FECAL OCCULT BLD: NEGATIVE
Fecal Occult Blood, POC: NEGATIVE

## 2014-09-15 ENCOUNTER — Telehealth: Payer: Self-pay | Admitting: Nurse Practitioner

## 2014-09-15 NOTE — Telephone Encounter (Signed)
Pt called stating that her exzyma is flaring up and is wanting to know if Something stronger that cortisone can be called in for it.    [facility]

## 2014-09-16 ENCOUNTER — Other Ambulatory Visit: Payer: Self-pay | Admitting: Nurse Practitioner

## 2014-09-16 MED ORDER — TRIAMCINOLONE ACETONIDE 0.1 % EX CREA: 1.0000 "application " | TOPICAL_CREAM | Freq: Two times a day (BID) | CUTANEOUS | Status: DC

## 2014-09-16 NOTE — Telephone Encounter (Signed)
Yes. Will send in new cream that can be used up to 2 weeks. Remember to moisturize well. Recommend cetaphil cream daily. Call back if persists.

## 2014-09-16 NOTE — Telephone Encounter (Signed)
Discussed with patient. Patient verbalized understanding. 

## 2014-09-17 ENCOUNTER — Other Ambulatory Visit: Payer: Self-pay | Admitting: Family Medicine

## 2014-09-17 DIAGNOSIS — Z1231 Encounter for screening mammogram for malignant neoplasm of breast: Secondary | ICD-10-CM

## 2014-10-13 ENCOUNTER — Ambulatory Visit (HOSPITAL_COMMUNITY): Payer: 59

## 2014-10-24 ENCOUNTER — Ambulatory Visit (HOSPITAL_COMMUNITY)
Admission: RE | Admit: 2014-10-24 | Discharge: 2014-10-24 | Disposition: A | Discharge status: Home / Self Care | Payer: BLUE CROSS/BLUE SHIELD | Source: Ambulatory Visit | Attending: Family Medicine | Admitting: Family Medicine

## 2014-10-24 DIAGNOSIS — Z1231 Encounter for screening mammogram for malignant neoplasm of breast: Secondary | ICD-10-CM | POA: Diagnosis present

## 2014-11-11 ENCOUNTER — Other Ambulatory Visit: Payer: Self-pay | Admitting: Nurse Practitioner

## 2015-01-19 IMAGING — CR DG HAND COMPLETE 3+V*R*
3 series · 3 of 3 positions shown · non-contrast
Comparison: None.

CLINICAL DATA: Increased pain and swelling over the joints.

EXAM:
RIGHT HAND - COMPLETE 3+ VIEW

[view not recorded (1 of 3)]
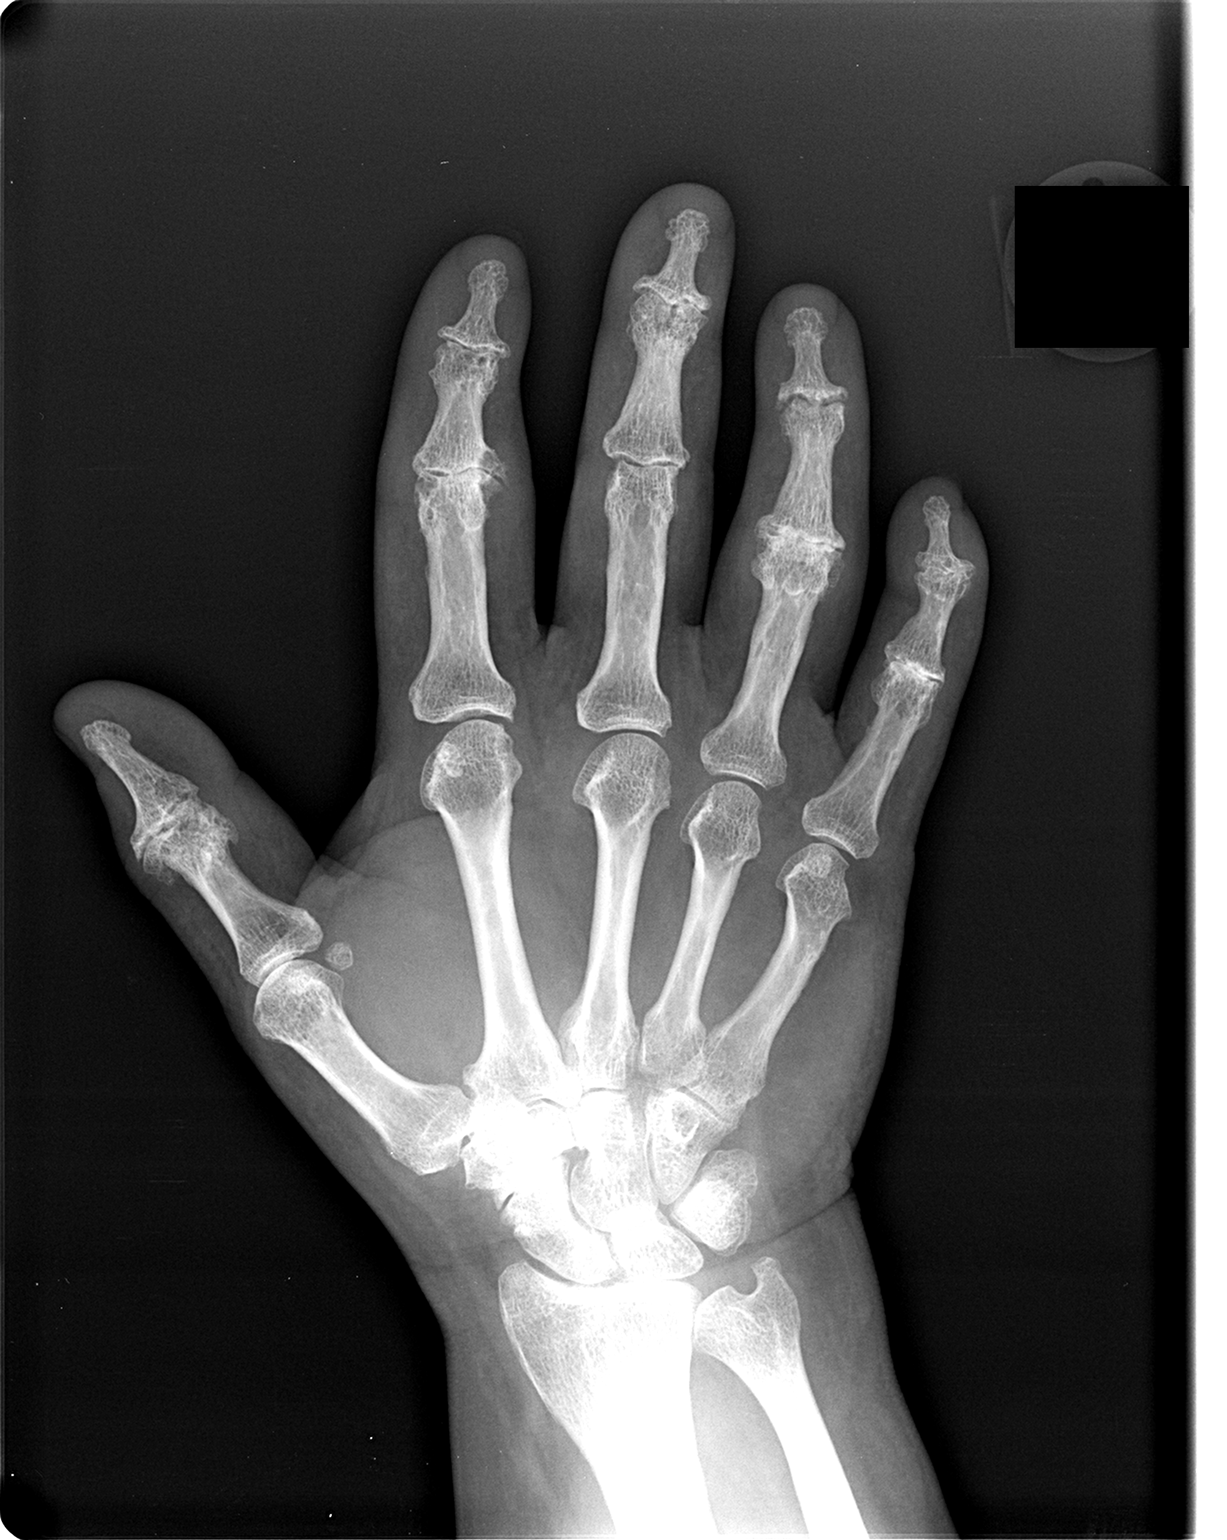

[view not recorded (2 of 3)]
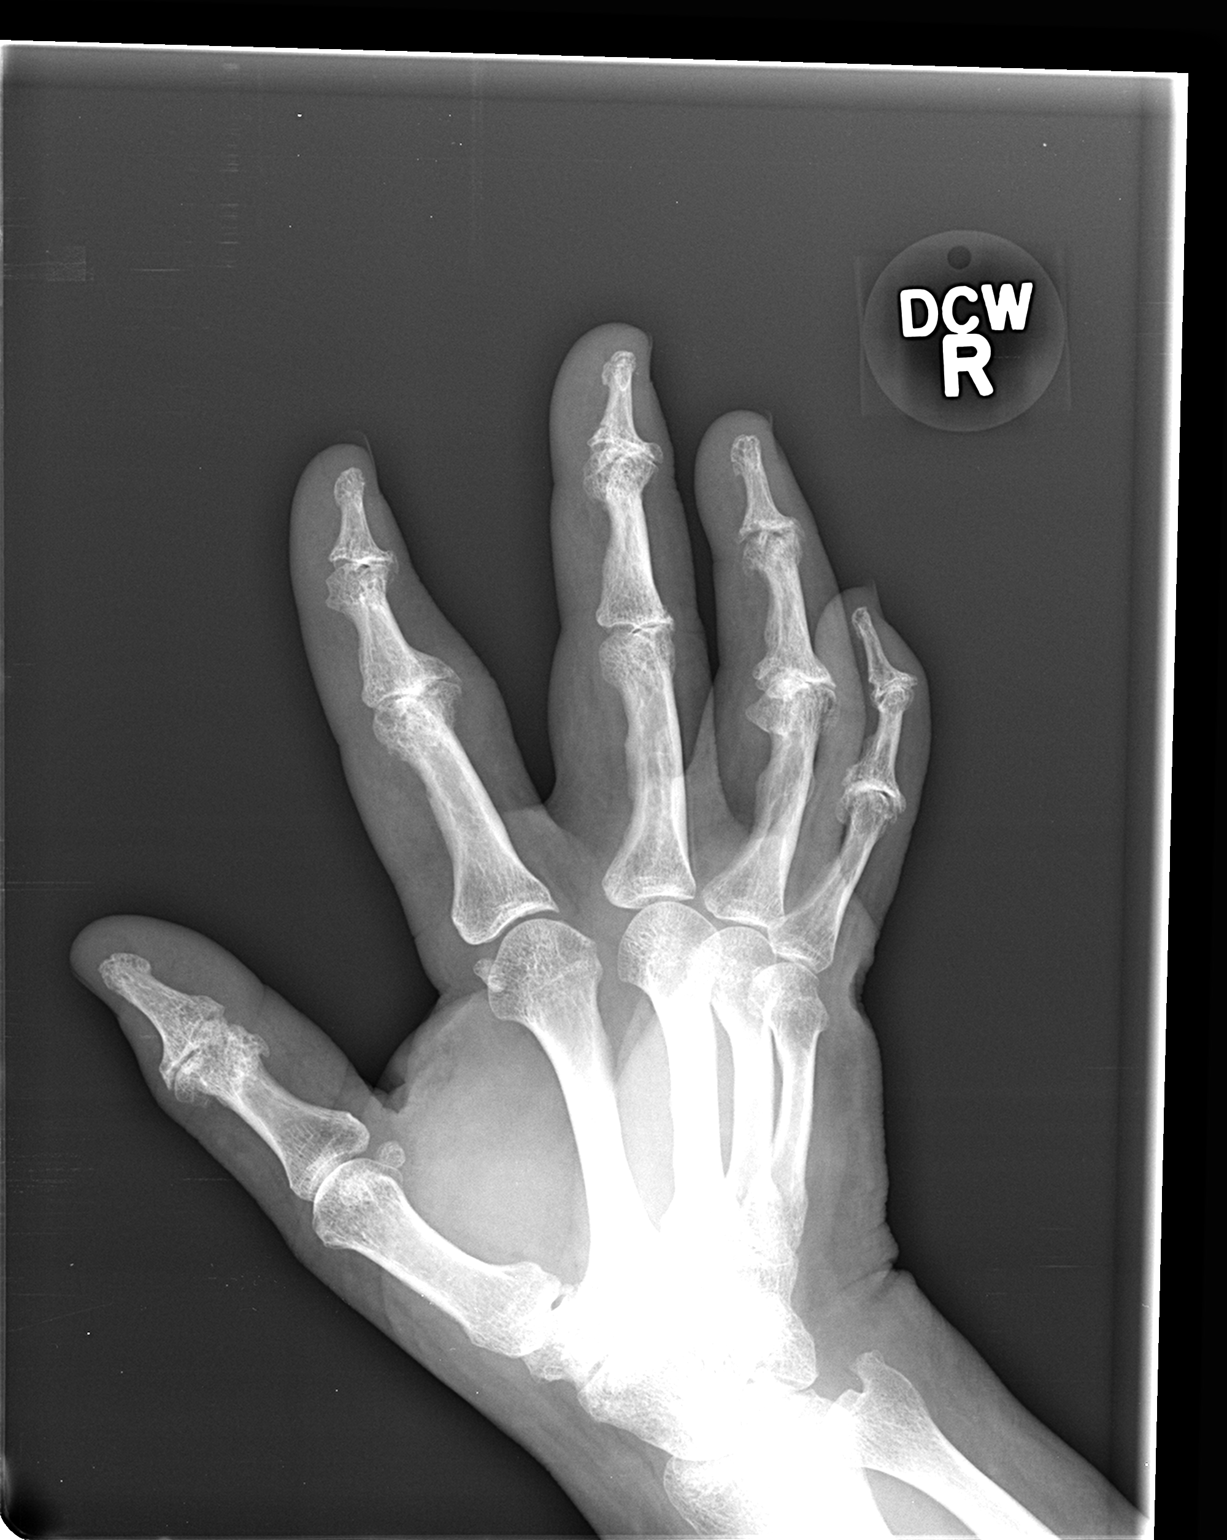

[view not recorded (3 of 3)]
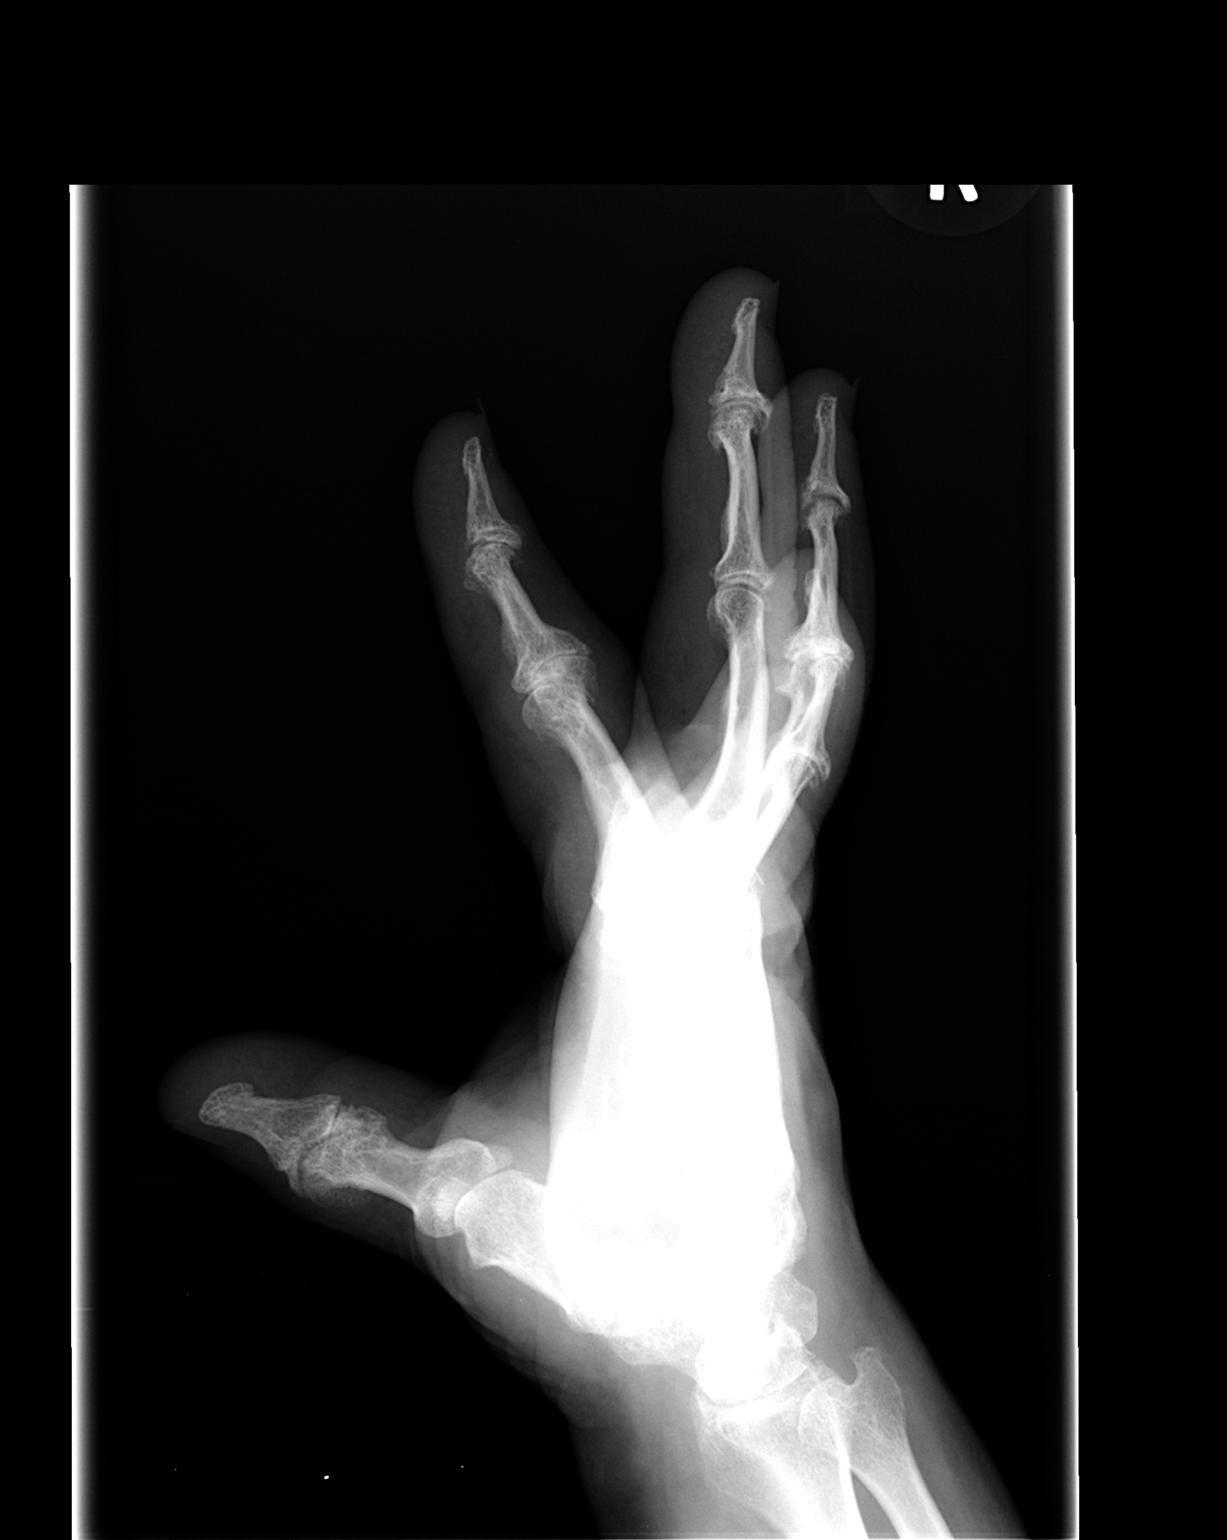

[3 of 3 positions shown; findings below may reference images not displayed]

FINDINGS: No fracture or dislocation.

There is asymmetric joint space narrowing, marginal osteophytes,
subchondral sclerosis as well as subchondral erosions and cystic
change. This affects fall interphalangeal joints sparing the
metacarpophalangeal joints. There is joint space narrowing and
subchondral sclerosis with small marginal osteophytes at the
scaphoid, trapezium, trapezoid reticulation.

There is diffuse soft tissue swelling of the fingers. No soft tissue
ossification or calcification.
IMPRESSION: Erosive osteoarthritis as detailed.

## 2015-02-04 ENCOUNTER — Ambulatory Visit: Payer: BLUE CROSS/BLUE SHIELD | Admitting: Nurse Practitioner

## 2015-05-13 ENCOUNTER — Other Ambulatory Visit: Payer: Self-pay | Admitting: Nurse Practitioner

## 2015-06-23 ENCOUNTER — Ambulatory Visit (INDEPENDENT_AMBULATORY_CARE_PROVIDER_SITE_OTHER): Payer: BLUE CROSS/BLUE SHIELD | Admitting: Nurse Practitioner

## 2015-06-23 ENCOUNTER — Encounter: Payer: Self-pay | Admitting: Nurse Practitioner

## 2015-06-23 VITALS — BP 148/84 | Ht 63.5 in | Wt 203.2 lb

## 2015-06-23 DIAGNOSIS — E785 Hyperlipidemia, unspecified: Secondary | ICD-10-CM | POA: Diagnosis not present

## 2015-06-23 DIAGNOSIS — I10 Essential (primary) hypertension: Secondary | ICD-10-CM | POA: Diagnosis not present

## 2015-06-23 DIAGNOSIS — J300 Vasomotor rhinitis: Secondary | ICD-10-CM | POA: Diagnosis not present

## 2015-06-23 DIAGNOSIS — R7303 Prediabetes: Secondary | ICD-10-CM | POA: Diagnosis not present

## 2015-06-23 LAB — POCT GLYCOSYLATED HEMOGLOBIN (HGB A1C): Hemoglobin A1C: 5.1

## 2015-06-23 MED ORDER — LISINOPRIL-HYDROCHLOROTHIAZIDE 20-25 MG PO TABS: 1.0000 | ORAL_TABLET | Freq: Every day | ORAL | Status: DC

## 2015-06-23 MED ORDER — PRAVASTATIN SODIUM 80 MG PO TABS: 80.0000 mg | ORAL_TABLET | Freq: Every day | ORAL | Status: DC

## 2015-06-23 MED ORDER — ALBUTEROL SULFATE HFA 108 (90 BASE) MCG/ACT IN AERS: 2.0000 | INHALATION_SPRAY | RESPIRATORY_TRACT | Status: DC | PRN

## 2015-06-23 MED ORDER — METFORMIN HCL 500 MG PO TABS: 500.0000 mg | ORAL_TABLET | Freq: Two times a day (BID) | ORAL | Status: DC

## 2015-06-23 NOTE — Progress Notes (Signed)
Subjective:  Presents for routine follow up. Has cut back sugar in her diet. Active job. Has maintained her weight through the holidays. Had labs done through work; has copy today. Has been out of BP pills x 1 1/2 weeks. Is now on Metformin BID and is tolerating well. Also had a cold a few weeks ago. Still having head congestion with sinus pressure mainly in the mornings. Cough improved. No further wheezing. No fever, sore throat or ear pain. Has been taking Allegra and Mucinex.   Objective:   BP 148/84 mmHg  Ht 5' 3.5" (1.613 m)  Wt 203 lb 4 oz (92.194 kg)  BMI 35.44 kg/m2 NAD. Alert, oriented. TMs clear effusion. Pharynx clear. Neck supple with mild anterior adenopathy. Lungs mildly diminished BS but clear. No tachypnea. Heart RRR. Results for orders placed or performed in visit on 06/23/15  POCT glycosylated hemoglobin (Hb A1C)  Result Value Ref Range   Hemoglobin A1C 5.1    Reviewed labs with patient done in Oct at work; see scanned form.   Assessment:  Problem List Items Addressed This Visit      Cardiovascular and Mediastinum   HTN (hypertension), benign   Relevant Medications   pravastatin (PRAVACHOL) 80 MG tablet   lisinopril-hydrochlorothiazide (PRINZIDE,ZESTORETIC) 20-25 MG tablet     Other   Hyperlipidemia   Relevant Medications   pravastatin (PRAVACHOL) 80 MG tablet   lisinopril-hydrochlorothiazide (PRINZIDE,ZESTORETIC) 20-25 MG tablet   Prediabetes - Primary   Relevant Orders   POCT glycosylated hemoglobin (Hb A1C) (Completed)    Other Visit Diagnoses    Vasomotor rhinitis          Plan:  Meds ordered this encounter  Medications  . pravastatin (PRAVACHOL) 80 MG tablet    Sig: Take 1 tablet (80 mg total) by mouth daily.    Dispense:  90 tablet    Refill:  1    Order Specific Question:  Supervising Provider    Answer:  Mikey Kirschner [[id number]]  . metFORMIN (GLUCOPHAGE) 500 MG tablet    Sig: Take 1 tablet (500 mg total) by mouth 2 (two) times daily with a  meal.    Dispense:  180 tablet    Refill:  1    Order Specific Question:  Supervising Provider    Answer:  Mikey Kirschner [[id number]]  . lisinopril-hydrochlorothiazide (PRINZIDE,ZESTORETIC) 20-25 MG tablet    Sig: Take 1 tablet by mouth daily.    Dispense:  90 tablet    Refill:  1    Needs office visit    Order Specific Question:  Supervising Provider    Answer:  Mikey Kirschner [[id number]]  . albuterol (PROVENTIL HFA;VENTOLIN HFA) 108 (90 Base) MCG/ACT inhaler    Sig: Inhale 2 puffs into the lungs every 4 (four) hours as needed for wheezing.    Dispense:  1 Inhaler    Refill:  2    Order Specific Question:  Supervising Provider    Answer:  Mikey Kirschner [[id number]]   Continue current meds and dietary measures.  Return in about 4 months (around 10/21/2015) for physical.

## 2015-09-07 ENCOUNTER — Ambulatory Visit (INDEPENDENT_AMBULATORY_CARE_PROVIDER_SITE_OTHER): Payer: BLUE CROSS/BLUE SHIELD | Admitting: Nurse Practitioner

## 2015-09-07 ENCOUNTER — Encounter: Payer: Self-pay | Admitting: Nurse Practitioner

## 2015-09-07 VITALS — BP 132/80 | Ht 63.0 in | Wt 195.0 lb

## 2015-09-07 DIAGNOSIS — B351 Tinea unguium: Secondary | ICD-10-CM

## 2015-09-07 DIAGNOSIS — Z0000 Encounter for general adult medical examination without abnormal findings: Secondary | ICD-10-CM

## 2015-09-07 MED ORDER — CICLOPIROX 8 % EX SOLN: Freq: Every day | CUTANEOUS | Status: DC

## 2015-09-07 NOTE — Patient Instructions (Signed)
Tea tree oil

## 2015-09-09 ENCOUNTER — Encounter: Payer: Self-pay | Admitting: Nurse Practitioner

## 2015-09-09 DIAGNOSIS — B351 Tinea unguium: Secondary | ICD-10-CM | POA: Insufficient documentation

## 2015-09-09 NOTE — Progress Notes (Signed)
   Subjective:    Patient ID: Aimee Dixon, female    DOB: 11/05/1950, 65 y.o.   MRN: [id number]  HPI presents for her wellness exam. Has had a hysterectomy. Still has ovaries. No pelvic pain. Same sexual partner. Plans eye exam. Regular dental exam. Active lifestyle. Has been working on weight. Gets labs at work. No copy available during visit. Has had 2 discolored toenails on one foot for months. No involvement of the other foot.     Review of Systems  Constitutional: Negative for activity change, appetite change and fatigue.  HENT: Negative for dental problem, ear pain, sinus pressure and sore throat.   Respiratory: Negative for cough, chest tightness, shortness of breath and wheezing.   Cardiovascular: Negative for chest pain.  Gastrointestinal: Negative for nausea, vomiting, abdominal pain, diarrhea, constipation and abdominal distention.  Genitourinary: Negative for dysuria, urgency, frequency, vaginal discharge, enuresis, difficulty urinating, genital sores and pelvic pain.       Objective:   Physical Exam  Constitutional: She is oriented to person, place, and time. She appears well-developed. No distress.  HENT:  Right Ear: External ear normal.  Left Ear: External ear normal.  Mouth/Throat: Oropharynx is clear and moist.  Neck: Normal range of motion. Neck supple. No tracheal deviation present. No thyromegaly present.  Cardiovascular: Normal rate, regular rhythm and normal heart sounds.  Exam reveals no gallop.   No murmur heard. Pulmonary/Chest: Effort normal and breath sounds normal.  Abdominal: Soft. She exhibits no distension. There is no tenderness.  Genitourinary: Vagina normal. No vaginal discharge found.  External GU: no rashes or lesions. Vagina: no discharge. Bimanual exam: no tenderness or obvious masses. Rectal exam: no masses; no stool for hemoccult.  Musculoskeletal: She exhibits no edema.  Lymphadenopathy:    She has no cervical adenopathy.  Neurological:  She is alert and oriented to person, place, and time.  Skin: Skin is warm and dry. No rash noted.  Great toe and another toe on one foot thickened with yellowish and dark discoloration. Faint ridges in the nail.   Psychiatric: She has a normal mood and affect. Her behavior is normal.  Vitals reviewed. Breast exam: areas of dense tissue with minimal nodularity; axillae no adenopathy.         Assessment & Plan:   Problem List Items Addressed This Visit      Musculoskeletal and Integument   Onychomycosis   Relevant Medications   ciclopirox (PENLAC) 8 % solution    Other Visit Diagnoses    Routine general medical examination at a health care facility    -  Primary    Relevant Orders    POC Hemoccult Bld/Stl (3-Cd Home Screen)      Request that patient get [place] a copy of her labs.  Meds ordered this encounter  Medications  . ciclopirox (PENLAC) 8 % solution    Sig: Apply topically at bedtime. Apply over nail and surrounding skin. Apply daily over previous coat. After seven (7) days, may remove with alcohol and continue cycle.    Dispense:  6.6 mL    Refill:  2    Order Specific Question:  Supervising Provider    Answer:  Maggie Font   Start Penlac as directed. Patient understands it may take months for this to resolve. Call back if no improvement. Recommend daily vit D/calcium and continued weight loss efforts. Return in about 6 months (around 03/08/2016) for recheck.

## 2015-10-13 ENCOUNTER — Other Ambulatory Visit: Payer: Self-pay | Admitting: Nurse Practitioner

## 2015-10-13 DIAGNOSIS — Z1231 Encounter for screening mammogram for malignant neoplasm of breast: Secondary | ICD-10-CM

## 2015-10-28 ENCOUNTER — Other Ambulatory Visit: Payer: Self-pay | Admitting: Nurse Practitioner

## 2015-10-28 ENCOUNTER — Ambulatory Visit (HOSPITAL_COMMUNITY)
Admission: RE | Admit: 2015-10-28 | Discharge: 2015-10-28 | Disposition: A | Discharge status: Home / Self Care | Payer: BLUE CROSS/BLUE SHIELD | Source: Ambulatory Visit | Attending: Nurse Practitioner | Admitting: Nurse Practitioner

## 2015-10-28 ENCOUNTER — Ambulatory Visit (HOSPITAL_COMMUNITY): Payer: BLUE CROSS/BLUE SHIELD

## 2015-10-28 ENCOUNTER — Ambulatory Visit (HOSPITAL_COMMUNITY): Admission: RE | Admit: 2015-10-28 | Payer: BLUE CROSS/BLUE SHIELD | Source: Ambulatory Visit

## 2015-10-28 DIAGNOSIS — Z1231 Encounter for screening mammogram for malignant neoplasm of breast: Secondary | ICD-10-CM | POA: Insufficient documentation

## 2015-12-18 ENCOUNTER — Other Ambulatory Visit: Payer: Self-pay | Admitting: Nurse Practitioner

## 2016-01-29 ENCOUNTER — Ambulatory Visit (INDEPENDENT_AMBULATORY_CARE_PROVIDER_SITE_OTHER): Payer: BLUE CROSS/BLUE SHIELD | Admitting: Nurse Practitioner

## 2016-01-29 ENCOUNTER — Encounter: Payer: Self-pay | Admitting: Nurse Practitioner

## 2016-01-29 ENCOUNTER — Other Ambulatory Visit: Payer: Self-pay | Admitting: Nurse Practitioner

## 2016-01-29 VITALS — BP 122/82 | Temp 98.4°F | Ht 63.0 in | Wt 197.1 lb

## 2016-01-29 DIAGNOSIS — B9689 Other specified bacterial agents as the cause of diseases classified elsewhere: Secondary | ICD-10-CM

## 2016-01-29 DIAGNOSIS — J069 Acute upper respiratory infection, unspecified: Secondary | ICD-10-CM

## 2016-01-29 MED ORDER — CEFDINIR 300 MG PO CAPS: 300.0000 mg | ORAL_CAPSULE | Freq: Two times a day (BID) | ORAL | 0 refills | Status: DC

## 2016-01-29 NOTE — Patient Instructions (Signed)
Steroid nasal spray: Flonase, Nasacort or Rhinocort

## 2016-01-30 ENCOUNTER — Encounter: Payer: Self-pay | Admitting: Nurse Practitioner

## 2016-01-30 NOTE — Progress Notes (Signed)
Subjective:  Presents for complaints of cough and congestion that began 2 days ago. Husband has had a similar illness. Low-grade fever. Temp this morning 99.5. Nonproductive cough. Facial area headache. Sore throat. Right ear pain. No wheezing.  Objective:   BP 122/82   Temp 98.4 F (36.9 C) (Oral)   Ht 5\' 3"  (1.6 m)   Wt 197 lb 2 oz (89.4 kg)   BMI 34.92 kg/m  NAD. Alert, oriented. TMs mild clear effusion, more on the right side. Pharynx injected with PND noted. Neck supple with mild anterior adenopathy. Lungs clear. Heart RRR.   Assessment: Bacterial upper respiratory infection  Plan:  Meds ordered this encounter  Medications  . cefdinir (OMNICEF) 300 MG capsule    Sig: Take 1 capsule (300 mg total) by mouth 2 (two) times daily.    Dispense:  20 capsule    Refill:  0    Order Specific Question:   Supervising Provider    Answer:   Mikey Kirschner [[id number]]   OTC meds as directed. Call back next week if no improvement, sooner if worse.

## 2016-03-07 ENCOUNTER — Ambulatory Visit (INDEPENDENT_AMBULATORY_CARE_PROVIDER_SITE_OTHER): Payer: BLUE CROSS/BLUE SHIELD | Admitting: Family Medicine

## 2016-03-07 ENCOUNTER — Encounter: Payer: Self-pay | Admitting: Family Medicine

## 2016-03-07 VITALS — BP 120/86 | Ht 63.0 in | Wt 194.0 lb

## 2016-03-07 DIAGNOSIS — R7303 Prediabetes: Secondary | ICD-10-CM

## 2016-03-07 DIAGNOSIS — E782 Mixed hyperlipidemia: Secondary | ICD-10-CM

## 2016-03-07 DIAGNOSIS — I10 Essential (primary) hypertension: Secondary | ICD-10-CM

## 2016-03-07 DIAGNOSIS — R739 Hyperglycemia, unspecified: Secondary | ICD-10-CM

## 2016-03-07 LAB — POCT GLYCOSYLATED HEMOGLOBIN (HGB A1C): Hemoglobin A1C: 5.8

## 2016-03-07 MED ORDER — LISINOPRIL-HYDROCHLOROTHIAZIDE 20-25 MG PO TABS: 1.0000 | ORAL_TABLET | Freq: Every day | ORAL | 1 refills | Status: DC

## 2016-03-07 MED ORDER — PRAVASTATIN SODIUM 80 MG PO TABS: ORAL_TABLET | ORAL | 1 refills | Status: DC

## 2016-03-07 MED ORDER — METFORMIN HCL 500 MG PO TABS: ORAL_TABLET | ORAL | 1 refills | Status: DC

## 2016-03-07 NOTE — Progress Notes (Signed)
   Subjective:    Patient ID: Aimee Dixon, female    DOB: 01-20-1951, 65 y.o.   MRN: [id number]  Hyperlipidemia  This is a chronic problem. The current episode started more than 1 year ago. Pertinent negatives include no chest pain. Treatments tried: pravastatin. Compliance problems include adherence to exercise.     Prediabetes. A1C today. 5.8   Pt declines flu vaccine. Will get at work.   Pt states no concerns today.    Review of Systems  Constitutional: Negative for activity change, appetite change and fatigue.  HENT: Negative for congestion.   Respiratory: Negative for cough.   Cardiovascular: Negative for chest pain.  Gastrointestinal: Negative for abdominal pain.  Endocrine: Negative for polydipsia and polyphagia.  Neurological: Negative for weakness.  Psychiatric/Behavioral: Negative for confusion.       Objective:   Physical Exam  Constitutional: She appears well-nourished. No distress.  Cardiovascular: Normal rate, regular rhythm and normal heart sounds.   No murmur heard. Pulmonary/Chest: Effort normal and breath sounds normal. No respiratory distress.  Musculoskeletal: She exhibits no edema.  Lymphadenopathy:    She has no cervical adenopathy.  Neurological: She is alert. She exhibits normal muscle tone.  Psychiatric: Her behavior is normal.  Vitals reviewed.   A1c looks good      Assessment & Plan:  Diabetes good control continue current medication Hyperlipidemia previous labs look good she is getting more labs through her workplace coming up in the next week or 2 continue medication Hypertension good control watch diet unable to exercise much because of her work schedule Follow-up 6 months

## 2016-03-31 ENCOUNTER — Telehealth: Payer: Self-pay | Admitting: Family Medicine

## 2016-03-31 DIAGNOSIS — Z79899 Other long term (current) drug therapy: Secondary | ICD-10-CM

## 2016-03-31 DIAGNOSIS — E785 Hyperlipidemia, unspecified: Secondary | ICD-10-CM

## 2016-03-31 NOTE — Telephone Encounter (Signed)
Review blood work results dropped off by the patient.

## 2016-04-03 ENCOUNTER — Encounter: Payer: Self-pay | Admitting: Family Medicine

## 2016-04-03 NOTE — Telephone Encounter (Signed)
Please let the patient know that I reviewed over the labs that were sent to [place] by her occupational department with the [place]. Her A1c kidney function liver functions look good thyroid function looks good. Her bad cholesterol known as LDL-moderately elevated at 112. Under current guidelines we would like to see them less than 70. I recommend for the patient to be on a different cholesterol medicine if she is willing. Typically we will use Lipitor. It is best for LDL to be below 70 to reduce the risk of heart attacks and strokes at she gets older. Please discuss with the patient see if she is willing to try a change in her cholesterol medicine. Thank you

## 2016-04-04 MED ORDER — ATORVASTATIN CALCIUM 10 MG PO TABS: 10.0000 mg | ORAL_TABLET | Freq: Every day | ORAL | 5 refills | Status: DC

## 2016-04-04 NOTE — Telephone Encounter (Signed)
Notified patient Dr. Nicki Reaper  reviewed over the labs that were sent to [place] by her occupational department with the [place]. Her A1c kidney function liver functions look good thyroid function looks good. Her bad cholesterol known as LDL-moderately elevated at 112. Under current guidelines we would like to see them less than 70. I recommend for the patient to be on a different cholesterol medicine if she is willing. Typically we will use Lipitor. It is best for LDL to be below 70 to reduce the risk of heart attacks and strokes at she gets older. Patient agreed to start on Lipitor. Please send prescription to [facility].

## 2016-04-04 NOTE — Telephone Encounter (Signed)
Start Lipitor 10 mg 1 daily, #30, 5 refills, repeat lipid liver profile in 8 weeks. Thank you

## 2016-04-04 NOTE — Telephone Encounter (Signed)
Med sent to pharmacy. Bloodwork ordered. Left message notifying patient that bloodwork has been ordered.

## 2016-06-09 LAB — LIPID PANEL
CHOL/HDL RATIO: 3.4 ratio (ref 0.0–4.4)
CHOLESTEROL TOTAL: 169 mg/dL (ref 100–199)
HDL: 49 mg/dL (ref >39)
LDL CALC: 103 mg/dL — AB (ref 0–99)
TRIGLYCERIDES: 84 mg/dL (ref 0–149)
VLDL CHOLESTEROL CAL: 17 mg/dL (ref 5–40)

## 2016-06-09 LAB — HEPATIC FUNCTION PANEL
ALT: 11 IU/L (ref 0–32)
AST: 16 IU/L (ref 0–40)
Albumin: 4.4 g/dL (ref 3.6–4.8)
Alkaline Phosphatase: 80 IU/L (ref 39–117)
BILIRUBIN TOTAL: 0.4 mg/dL (ref 0.0–1.2)
Bilirubin, Direct: 0.1 mg/dL (ref 0.00–0.40)
Total Protein: 7 g/dL (ref 6.0–8.5)

## 2016-06-10 ENCOUNTER — Telehealth: Payer: Self-pay | Admitting: Pediatrics

## 2016-06-10 DIAGNOSIS — E782 Mixed hyperlipidemia: Secondary | ICD-10-CM

## 2016-06-10 NOTE — Telephone Encounter (Signed)
-----  Message from Kathyrn Drown, MD sent at 06/09/2016 10:15 PM EST ----- HDL very good at 49 LDL at 103 ideally we would like to see the bad cholesterol-known as LDL-below 70 if possible. This would reduce risk of heart disease further. Patient currently taking Lipitor 10 mg daily. If the patient is tolerating this medicine I would recommend increasing the dose using 20 mg as the new dose(#30, 5 refills) 1 daily. Patient is due for a follow-up in the later spring near April lab work and office visit would be wise I would recommend hemoglobin A1c, met 7, lipid, liver before follow-up office visit in April. If unable to tolerate the higher dose please notify us.

## 2016-06-10 NOTE — Telephone Encounter (Signed)
Patient advised of labs being ordered and to have them drawn previous to appt in April She voiced understanding and agreed with plan.

## 2016-06-21 ENCOUNTER — Telehealth: Payer: Self-pay | Admitting: Family Medicine

## 2016-06-21 ENCOUNTER — Other Ambulatory Visit: Payer: Self-pay | Admitting: *Deleted

## 2016-06-21 MED ORDER — ATORVASTATIN CALCIUM 20 MG PO TABS: 20.0000 mg | ORAL_TABLET | Freq: Every day | ORAL | 5 refills | Status: DC

## 2016-06-21 NOTE — Telephone Encounter (Signed)
Med sent to pharm. Left message to notify pt.

## 2016-06-21 NOTE — Telephone Encounter (Signed)
Patient requesting refill on atorvastatin 20 mg called into [facility] [place]. She states medication mg was increase at last visit from 10 to 20

## 2016-08-08 ENCOUNTER — Ambulatory Visit (INDEPENDENT_AMBULATORY_CARE_PROVIDER_SITE_OTHER): Payer: BLUE CROSS/BLUE SHIELD | Admitting: Nurse Practitioner

## 2016-08-08 ENCOUNTER — Encounter: Payer: Self-pay | Admitting: Nurse Practitioner

## 2016-08-08 VITALS — BP 112/70 | Temp 100.2°F | Ht 63.0 in | Wt 189.2 lb

## 2016-08-08 DIAGNOSIS — J111 Influenza due to unidentified influenza virus with other respiratory manifestations: Secondary | ICD-10-CM

## 2016-08-08 MED ORDER — AZITHROMYCIN 250 MG PO TABS: ORAL_TABLET | ORAL | 0 refills | Status: DC

## 2016-08-08 MED ORDER — ONDANSETRON 8 MG PO TBDP: 8.0000 mg | ORAL_TABLET | Freq: Three times a day (TID) | ORAL | 0 refills | Status: DC | PRN

## 2016-08-09 ENCOUNTER — Encounter: Payer: Self-pay | Admitting: Nurse Practitioner

## 2016-08-09 NOTE — Progress Notes (Signed)
Subjective:  Presents for c/o fever, chills and cough that began 3 days ago. Scratchy throat. Frontal area headache. Runny nose began today. Occasional cough. No wheezing but used albuterol inhaler twice yesterday which helped cough. Left ear pain. Had a couple episodes of vomiting and diarrhea yesterday but this has resolved. Continues to have nausea. Taking fluids well. Voiding nl.   Objective:   BP 112/70   Temp 100.2 F (37.9 C) (Oral)   Ht 5\' 3"  (1.6 m)   Wt 189 lb 4 oz (85.8 kg)   BMI 33.52 kg/m  NAD. Alert, oriented. TMs retracted no erythema. Pharynx mildly injected with cloudy PND. Neck supple with mild anterior adenopathy. Lungs clear. Heart RRR. Abdomen soft, non distended with mild epigastric area tenderness.   Assessment:  Influenza    Plan:   Meds ordered this encounter  Medications  . ondansetron (ZOFRAN-ODT) 8 MG disintegrating tablet    Sig: Take 1 tablet (8 mg total) by mouth every 8 (eight) hours as needed for nausea or vomiting.    Dispense:  30 tablet    Refill:  0    Order Specific Question:   Supervising Provider    Answer:   Mikey Kirschner [[id number]]  . azithromycin (ZITHROMAX Z-PAK) 250 MG tablet    Sig: Take 2 tablets (500 mg) on  Day 1,  followed by 1 tablet (250 mg) once daily on Days 2 through 5.    Dispense:  6 each    Refill:  0    Order Specific Question:   Supervising Provider    Answer:   Mikey Kirschner [[id number]]   Hold on antibiotics unless symptoms worsen. Reviewed symptomatic care and warning signs.  Call back in 72 hours if no improvement, sooner if worse.

## 2016-09-07 ENCOUNTER — Encounter: Payer: BLUE CROSS/BLUE SHIELD | Admitting: Nurse Practitioner

## 2016-09-12 ENCOUNTER — Encounter: Payer: BLUE CROSS/BLUE SHIELD | Admitting: Nurse Practitioner

## 2016-09-15 ENCOUNTER — Other Ambulatory Visit: Payer: Self-pay | Admitting: Family Medicine

## 2016-09-21 ENCOUNTER — Encounter: Payer: Self-pay | Admitting: Nurse Practitioner

## 2016-09-21 ENCOUNTER — Ambulatory Visit (INDEPENDENT_AMBULATORY_CARE_PROVIDER_SITE_OTHER): Payer: BLUE CROSS/BLUE SHIELD | Admitting: Nurse Practitioner

## 2016-09-21 VITALS — BP 142/86 | Ht 63.0 in | Wt 195.0 lb

## 2016-09-21 DIAGNOSIS — Z780 Asymptomatic menopausal state: Secondary | ICD-10-CM

## 2016-09-21 DIAGNOSIS — Z0000 Encounter for general adult medical examination without abnormal findings: Secondary | ICD-10-CM

## 2016-09-21 DIAGNOSIS — R5383 Other fatigue: Secondary | ICD-10-CM | POA: Diagnosis not present

## 2016-09-22 ENCOUNTER — Encounter: Payer: Self-pay | Admitting: Nurse Practitioner

## 2016-09-22 ENCOUNTER — Other Ambulatory Visit: Payer: Self-pay | Admitting: Nurse Practitioner

## 2016-09-22 DIAGNOSIS — E559 Vitamin D deficiency, unspecified: Secondary | ICD-10-CM | POA: Insufficient documentation

## 2016-09-22 LAB — VITAMIN D 25 HYDROXY (VIT D DEFICIENCY, FRACTURES): Vit D, 25-Hydroxy: 20 ng/mL — ABNORMAL LOW (ref 30.0–100.0)

## 2016-09-22 LAB — BASIC METABOLIC PANEL
BUN / CREAT RATIO: 22 (ref 12–28)
BUN: 12 mg/dL (ref 8–27)
CHLORIDE: 101 mmol/L (ref 96–106)
CO2: 30 mmol/L — ABNORMAL HIGH (ref 18–29)
Calcium: 10.1 mg/dL (ref 8.7–10.3)
Creatinine, Ser: 0.54 mg/dL — ABNORMAL LOW (ref 0.57–1.00)
GFR calc Af Amer: 116 mL/min/{1.73_m2} (ref >59)
GFR calc non Af Amer: 101 mL/min/{1.73_m2} (ref >59)
Glucose: 119 mg/dL — ABNORMAL HIGH (ref 65–99)
Potassium: 3.6 mmol/L (ref 3.5–5.2)
Sodium: 147 mmol/L — ABNORMAL HIGH (ref 134–144)

## 2016-09-22 LAB — LIPID PANEL
CHOL/HDL RATIO: 3.2 ratio (ref 0.0–4.4)
CHOLESTEROL TOTAL: 166 mg/dL (ref 100–199)
HDL: 52 mg/dL (ref >39)
LDL Calculated: 101 mg/dL — ABNORMAL HIGH (ref 0–99)
TRIGLYCERIDES: 67 mg/dL (ref 0–149)
VLDL Cholesterol Cal: 13 mg/dL (ref 5–40)

## 2016-09-22 LAB — HEPATIC FUNCTION PANEL
ALT: 11 IU/L (ref 0–32)
AST: 16 IU/L (ref 0–40)
Albumin: 4.4 g/dL (ref 3.6–4.8)
Alkaline Phosphatase: 103 IU/L (ref 39–117)
BILIRUBIN TOTAL: 0.4 mg/dL (ref 0.0–1.2)
BILIRUBIN, DIRECT: 0.12 mg/dL (ref 0.00–0.40)
Total Protein: 7.1 g/dL (ref 6.0–8.5)

## 2016-09-22 LAB — HEMOGLOBIN A1C
ESTIMATED AVERAGE GLUCOSE: 123 mg/dL
HEMOGLOBIN A1C: 5.9 % — AB (ref 4.8–5.6)

## 2016-09-22 MED ORDER — VITAMIN D (ERGOCALCIFEROL) 1.25 MG (50000 UNIT) PO CAPS: 50000.0000 [IU] | ORAL_CAPSULE | ORAL | 2 refills | Status: DC

## 2016-09-22 NOTE — Progress Notes (Signed)
   Subjective:    Patient ID: Aimee Dixon, female    DOB: 1950/08/10, 66 y.o.   MRN: [id number]  HPI presents for her wellness exam. No new sexual partners. Active job. Working long hours and taking care of her mother. Tries to do well with her diet. Regular vision and dental exams.     Review of Systems  Constitutional: Positive for fatigue. Negative for activity change and appetite change.  HENT: Negative for dental problem, ear pain, sinus pressure and sore throat.   Respiratory: Negative for cough, chest tightness, shortness of breath and wheezing.   Cardiovascular: Negative for chest pain.  Gastrointestinal: Negative for abdominal distention, abdominal pain, blood in stool, constipation, diarrhea, nausea and vomiting.  Genitourinary: Negative for difficulty urinating, dysuria, enuresis, frequency, genital sores, pelvic pain, urgency and vaginal discharge.       Objective:   Physical Exam  Constitutional: She is oriented to person, place, and time. She appears well-developed. No distress.  HENT:  Right Ear: External ear normal.  Left Ear: External ear normal.  Mouth/Throat: Oropharynx is clear and moist.  Neck: Normal range of motion. Neck supple. No tracheal deviation present. No thyromegaly present.  Cardiovascular: Normal rate, regular rhythm and normal heart sounds.  Exam reveals no gallop.   No murmur heard. Pulmonary/Chest: Effort normal and breath sounds normal.  Abdominal: Soft. She exhibits no distension. There is no tenderness.  Genitourinary: Vagina normal. No vaginal discharge found.  Genitourinary Comments: External GU: no rashes or lesions. Vagina: slightly pale; no discharge. Bimanual exam: no tenderness or obvious masses; exam limited due to abd girth.   Musculoskeletal: She exhibits no edema.  Lymphadenopathy:    She has no cervical adenopathy.  Neurological: She is alert and oriented to person, place, and time.  Skin: Skin is warm and dry. No rash noted.    Psychiatric: She has a normal mood and affect. Her behavior is normal.  Vitals reviewed. Breast exam: no masses; axillae no adenopathy.         Assessment & Plan:  Routine general medical examination at a health care facility  Fatigue, unspecified type - Plan: VITAMIN D 25 Hydroxy (Vit-D Deficiency, Fractures)  Postmenopause - Plan: DG Bone Density  Recommend regular activity, healthy diet and weight loss. Daily vitamin D and calcium. Labs pending. Return in about 6 months (around 03/23/2017) for routine follow up.

## 2016-09-28 ENCOUNTER — Other Ambulatory Visit: Payer: Self-pay | Admitting: Family Medicine

## 2016-09-28 ENCOUNTER — Ambulatory Visit (HOSPITAL_COMMUNITY)
Admission: RE | Admit: 2016-09-28 | Discharge: 2016-09-28 | Disposition: A | Discharge status: Home / Self Care | Payer: BLUE CROSS/BLUE SHIELD | Source: Ambulatory Visit | Attending: Nurse Practitioner | Admitting: Nurse Practitioner

## 2016-09-28 DIAGNOSIS — Z1231 Encounter for screening mammogram for malignant neoplasm of breast: Secondary | ICD-10-CM

## 2016-09-28 DIAGNOSIS — Z780 Asymptomatic menopausal state: Secondary | ICD-10-CM | POA: Insufficient documentation

## 2016-10-22 ENCOUNTER — Other Ambulatory Visit: Payer: Self-pay | Admitting: Family Medicine

## 2016-10-28 ENCOUNTER — Ambulatory Visit (HOSPITAL_COMMUNITY)
Admission: RE | Admit: 2016-10-28 | Discharge: 2016-10-28 | Disposition: A | Discharge status: Home / Self Care | Payer: BLUE CROSS/BLUE SHIELD | Source: Ambulatory Visit | Attending: Family Medicine | Admitting: Family Medicine

## 2016-10-28 DIAGNOSIS — Z1231 Encounter for screening mammogram for malignant neoplasm of breast: Secondary | ICD-10-CM | POA: Insufficient documentation

## 2016-12-16 ENCOUNTER — Other Ambulatory Visit: Payer: Self-pay | Admitting: Family Medicine

## 2017-03-22 ENCOUNTER — Ambulatory Visit (INDEPENDENT_AMBULATORY_CARE_PROVIDER_SITE_OTHER): Payer: BLUE CROSS/BLUE SHIELD | Admitting: Nurse Practitioner

## 2017-03-22 ENCOUNTER — Encounter: Payer: Self-pay | Admitting: Nurse Practitioner

## 2017-03-22 VITALS — BP 148/88 | Ht 63.0 in | Wt 201.0 lb

## 2017-03-22 DIAGNOSIS — J0100 Acute maxillary sinusitis, unspecified: Secondary | ICD-10-CM

## 2017-03-22 DIAGNOSIS — I10 Essential (primary) hypertension: Secondary | ICD-10-CM

## 2017-03-22 DIAGNOSIS — R7303 Prediabetes: Secondary | ICD-10-CM

## 2017-03-22 MED ORDER — AZITHROMYCIN 250 MG PO TABS: ORAL_TABLET | ORAL | 0 refills | Status: DC

## 2017-03-22 MED ORDER — LISINOPRIL-HYDROCHLOROTHIAZIDE 20-25 MG PO TABS: ORAL_TABLET | ORAL | 1 refills | Status: DC

## 2017-03-22 MED ORDER — AZITHROMYCIN 250 MG PO TABS
ORAL_TABLET | ORAL | 0 refills | Status: DC
Start: 2017-03-22 — End: 2017-03-22

## 2017-03-22 NOTE — Patient Instructions (Signed)
Nasacort AQ as directed 

## 2017-03-22 NOTE — Progress Notes (Signed)
Subjective: Presents for routine follow-up on hypertension and prediabetes.  Has brought her lab work in today see scanned results.  No chest pain/ischemic type pain or shortness of breath.  No TIA symptoms.  Doing fairly well with her diet.  Difficulty with sleep, has been staying at her mother's house 2 nights a week due to her dementia.  Continues to be under some stress.  Also complaints of maxillary area sinus pressure off and on, worse over the past 2 days.  Postnasal drainage with a bad taste in her mouth.  Ear pain.  Scratchy throat.  Slight cough.  No wheezing.  No fever.  Objective:   BP (!) 148/88   Ht 5\' 3"  (1.6 m)   Wt 201 lb (91.2 kg)   BMI 35.61 kg/m  NAD.  Alert, oriented.  TMs retracted bilateral, no erythema.  Pharynx injected with green PND noted.  Neck supple with mild soft anterior adenopathy.  Lungs clear.  Heart regular rate and rhythm.  Carotids no bruits or thrills.  Lower extremities no edema.  See scanned lab report dated 03/09/2017.  Lipid panel: Cholesterol 153, HDL 56, triglycerides 96 and LDL 79.  Hemoglobin A1c 5.9.  Remainder of labs are normal.  Assessment:   Problem List Items Addressed This Visit      Cardiovascular and Mediastinum   HTN (hypertension), benign - Primary   Relevant Medications   lisinopril-hydrochlorothiazide (PRINZIDE,ZESTORETIC) 20-25 MG tablet     Other   Prediabetes    Other Visit Diagnoses    Acute non-recurrent maxillary sinusitis       Relevant Medications   azithromycin (ZITHROMAX Z-PAK) 250 MG tablet       Plan:   Meds ordered this encounter  Medications  . OVER THE COUNTER MEDICATION    Sig: mucinex one daily  . lisinopril-hydrochlorothiazide (PRINZIDE,ZESTORETIC) 20-25 MG tablet    Sig: TAKE 1 TABLET BY MOUTH EVERY DAY. NEEDS OFFICE VISIT FOR REFILLS    Dispense:  90 tablet    Refill:  1    Order Specific Question:   Supervising Provider    Answer:   Mikey Kirschner [[id number]]  . DISCONTD: azithromycin (ZITHROMAX  Z-PAK) 250 MG tablet    Sig: Take 2 tablets (500 mg) on  Day 1,  followed by 1 tablet (250 mg) once daily on Days 2 through 5.    Dispense:  6 each    Refill:  0    Order Specific Question:   Supervising Provider    Answer:   Mikey Kirschner [[id number]]  . azithromycin (ZITHROMAX Z-PAK) 250 MG tablet    Sig: Take 2 tablets (500 mg) on  Day 1,  followed by 1 tablet (250 mg) once daily on Days 2 through 5.    Dispense:  6 each    Refill:  0    Order Specific Question:   Supervising Provider    Answer:   Mikey Kirschner [[id number]]   OTC meds as directed for congestion and cough.  Warning signs reviewed.  Call back if worsens or persist.  Continue current medications as directed.  Discussed importance of healthy diet, weight loss, activity and stress reduction.   Return in about 6 months (around 09/20/2017) for recheck. 25 minutes was spent with the patient. Greater than half the time was spent in discussion and answering questions and counseling regarding the issues that the patient came in for today.

## 2017-04-27 ENCOUNTER — Other Ambulatory Visit: Payer: Self-pay | Admitting: Family Medicine

## 2017-05-25 ENCOUNTER — Other Ambulatory Visit: Payer: Self-pay | Admitting: Family Medicine

## 2017-05-26 ENCOUNTER — Other Ambulatory Visit: Payer: Self-pay | Admitting: Nurse Practitioner

## 2017-05-26 NOTE — Progress Notes (Signed)
Nurses please clarify; is she on Pravastatin or Atorvastatin. We just received refill request for Pravastatin but chart has Lipitor.

## 2017-08-30 ENCOUNTER — Encounter: Payer: Self-pay | Admitting: Internal Medicine

## 2017-09-20 ENCOUNTER — Ambulatory Visit: Payer: BLUE CROSS/BLUE SHIELD | Admitting: Family Medicine

## 2017-09-21 ENCOUNTER — Other Ambulatory Visit: Payer: Self-pay | Admitting: Family Medicine

## 2017-09-21 ENCOUNTER — Encounter: Payer: Self-pay | Admitting: Family Medicine

## 2017-09-21 ENCOUNTER — Ambulatory Visit: Payer: BLUE CROSS/BLUE SHIELD | Admitting: Family Medicine

## 2017-09-21 VITALS — BP 142/98 | Ht 63.0 in | Wt 200.4 lb

## 2017-09-21 DIAGNOSIS — R7303 Prediabetes: Secondary | ICD-10-CM

## 2017-09-21 DIAGNOSIS — E782 Mixed hyperlipidemia: Secondary | ICD-10-CM

## 2017-09-21 DIAGNOSIS — M25541 Pain in joints of right hand: Secondary | ICD-10-CM | POA: Diagnosis not present

## 2017-09-21 DIAGNOSIS — F320 Major depressive disorder, single episode, mild: Secondary | ICD-10-CM | POA: Diagnosis not present

## 2017-09-21 DIAGNOSIS — I10 Essential (primary) hypertension: Secondary | ICD-10-CM

## 2017-09-21 LAB — POCT GLYCOSYLATED HEMOGLOBIN (HGB A1C): HEMOGLOBIN A1C: 5.6

## 2017-09-21 MED ORDER — LISINOPRIL-HYDROCHLOROTHIAZIDE 20-25 MG PO TABS: ORAL_TABLET | ORAL | 1 refills | Status: DC

## 2017-09-21 MED ORDER — ATORVASTATIN CALCIUM 20 MG PO TABS: 20.0000 mg | ORAL_TABLET | Freq: Every day | ORAL | 1 refills | Status: DC

## 2017-09-21 MED ORDER — METFORMIN HCL 500 MG PO TABS: ORAL_TABLET | ORAL | 1 refills | Status: DC

## 2017-09-21 MED ORDER — SERTRALINE HCL 50 MG PO TABS
50.0000 mg | ORAL_TABLET | Freq: Every day | ORAL | 3 refills | Status: DC
Start: 2017-09-21 — End: 2017-11-09

## 2017-09-21 NOTE — Progress Notes (Signed)
Subjective:    Patient ID: Aimee Dixon, female    DOB: 21-Apr-1951, 67 y.o.   MRN: [id number]  Hypertension  This is a chronic problem. The current episode started more than 1 year ago. Pertinent negatives include no chest pain. Treatments tried: lisinopril-hctz. Compliance problems include diet and exercise.    Pt states no concerns today.   Patient does have mild depression symptoms she is very stressed because not only does she work full-time she also takes care of her mother who has debilitating health issues. The patient also does not exercise as much as she should nor does she always watch how she eats She does battle with mild obesity Also has diabetes hypertension hyperlipidemia. Takes her medicine on a regular basis Takes metformin approximately 1/day prediabetes Results for orders placed or performed in visit on 09/21/17  POCT glycosylated hemoglobin (Hb A1C)  Result Value Ref Range   Hemoglobin A1C 5.6    Patient for blood pressure check up. Patient relates compliance with meds. Todays BP reviewed with the patient. Patient denies issues with medication. Patient relates reasonable diet. Patient tries to minimize salt. Patient aware of BP goals.  The patient was seen today as part of a comprehensive diabetic check up.The patient relates medication compliance. No significant side effects to the medications. Denies any low glucose spells. Relates compliance with diet to a reasonable level. Patient does do labwork intermittently and understands the dangers of diabetes.  Patient also having depression symptoms please see below  Review of Systems  Constitutional: Negative for activity change, appetite change and fatigue.  HENT: Negative for congestion.   Respiratory: Negative for cough.   Cardiovascular: Negative for chest pain.  Gastrointestinal: Negative for abdominal pain.  Endocrine: Negative for polydipsia and polyphagia.  Skin: Negative for color change.    Neurological: Negative for weakness.  Psychiatric/Behavioral: Negative for confusion.       Objective:   Physical Exam  Constitutional: She appears well-developed and well-nourished. No distress.  HENT:  Head: Normocephalic and atraumatic.  Eyes: Right eye exhibits no discharge. Left eye exhibits no discharge.  Neck: No tracheal deviation present.  Cardiovascular: Normal rate, regular rhythm and normal heart sounds.  No murmur heard. Pulmonary/Chest: Effort normal and breath sounds normal. No respiratory distress. She has no wheezes. She has no rales.  Musculoskeletal: She exhibits no edema.  Lymphadenopathy:    She has no cervical adenopathy.  Neurological: She is alert. She exhibits normal muscle tone.  Skin: Skin is warm and dry. No erythema.  Psychiatric: Her behavior is normal.  Vitals reviewed.  Patient does have significant arthralgias in the right hand she has seen rheumatology several years ago they felt it was severe osteoarthritis she states is no difference to where it was  We did discuss her depression I do feel the patient is dealing with moderate depression she is not suicidal she does agree to trying medication she wants to try medication mechanism of action of medicine as well as treatment goals were discussed       Assessment & Plan:  Pre-diabetes good control continue current measures Metformin 1/day is fine  Blood pressure good control continue current measures  Major depression-Zoloft 50 mg one half daily for the first week then 1 daily thereafter recommend for the patient to notify us if any problems otherwise follow-up 6 weeks  Hyperlipidemia controlled by diet patient will get lab work in the fall as usual  Osteoarthritis right hand Tylenol as needed  Follow-up on  depression in 6 weeks follow-up sooner problems

## 2017-09-21 NOTE — Patient Instructions (Addendum)
Alzheimers.gov   Start sertraline Use 1/2 tablet daily for the first week  Then one per day  Recheck in 6 weeks

## 2017-11-01 ENCOUNTER — Ambulatory Visit: Payer: BLUE CROSS/BLUE SHIELD

## 2017-11-02 ENCOUNTER — Ambulatory Visit: Payer: BLUE CROSS/BLUE SHIELD

## 2017-11-03 ENCOUNTER — Ambulatory Visit: Payer: BLUE CROSS/BLUE SHIELD | Admitting: Family Medicine

## 2017-11-08 ENCOUNTER — Ambulatory Visit (INDEPENDENT_AMBULATORY_CARE_PROVIDER_SITE_OTHER): Payer: Self-pay

## 2017-11-08 DIAGNOSIS — Z86010 Personal history of colonic polyps: Secondary | ICD-10-CM

## 2017-11-08 MED ORDER — NA SULFATE-K SULFATE-MG SULF 17.5-3.13-1.6 GM/177ML PO SOLN: 1.0000 | ORAL | 0 refills | Status: DC

## 2017-11-08 NOTE — Progress Notes (Addendum)
Gastroenterology Pre-Procedure Review  Request Date:11/08/17 Requesting Physician:5 year recall last tcs 10/03/12- RMR- normal, hx of tubular adenoma on 08/03/09  PATIENT REVIEW QUESTIONS: The patient responded to the following health history questions as indicated:    1. Diabetes Melitis: yes (borderline, taking metformin 500mg  once a day) 2. Joint replacements in the past 12 months: no 3. Major health problems in the past 3 months: no 4. Has an artificial valve or MVP: no 5. Has a defibrillator: no 6. Has been advised in past to take antibiotics in advance of a procedure like teeth cleaning: no 7. Family history of colon cancer: no  8. Alcohol Use: no 9. History of sleep apnea: no  10. History of coronary artery or other vascular stents placed within the last 12 months: no 11. History of any prior anesthesia complications: no    MEDICATIONS & ALLERGIES:    Patient reports the following regarding taking any blood thinners:   Plavix? no Aspirin? no Coumadin? no Brilinta? no Xarelto? no Eliquis? no Pradaxa? no Savaysa? no Effient? no  Patient confirms/reports the following medications:  Current Outpatient Medications  Medication Sig Dispense Refill  . albuterol (PROVENTIL HFA;VENTOLIN HFA) 108 (90 Base) MCG/ACT inhaler Inhale 2 puffs into the lungs every 4 (four) hours as needed for wheezing. 1 Inhaler 2  . atorvastatin (LIPITOR) 20 MG tablet Take 1 tablet (20 mg total) by mouth daily. 90 tablet 1  . fexofenadine (ALLEGRA) 180 MG tablet Take 180 mg by mouth daily.    . GuaiFENesin (MUCINEX PO) Take by mouth daily. Reported on 06/23/2015    . lisinopril-hydrochlorothiazide (PRINZIDE,ZESTORETIC) 20-25 MG tablet TAKE 1 TABLET BY MOUTH EVERY DAY. 90 tablet 1  . metFORMIN (GLUCOPHAGE) 500 MG tablet TAKE 1 TABLET BY MOUTH daily 90 tablet 1  . sertraline (ZOLOFT) 50 MG tablet Take 1 tablet (50 mg total) by mouth daily. 30 tablet 3  . triamcinolone cream (KENALOG) 0.1 % Apply 1 application  topically 2 (two) times daily. Prn rash; use up to 2 weeks 30 g 0   No current facility-administered medications for this visit.     Patient confirms/reports the following allergies:  Allergies  Allergen Reactions  . Norvasc [Amlodipine Besylate]     No orders of the defined types were placed in this encounter.   AUTHORIZATION INFORMATION Primary Insurance: BCBS  TT#[id number] Pre-Cert / Josem Kaufmann required:no Pre-Cert / Auth #:Teresa ref# [id number]   SCHEDULE INFORMATION: Procedure has been scheduled as follows:  Date: 01/17/18, Time: 8:30 Location: [facility] Dr[facility]  This Gastroenterology Pre-Precedure Review Form is being routed to the following provider(s): Walden Field NP

## 2017-11-08 NOTE — Patient Instructions (Addendum)
Aimee Dixon  [birth year] MRN: [id number]     Procedure Date: 01/17/18 Time to register: 7:30 Place to register: [facility] Procedure Time: 8:30 Scheduled provider: R. Garfield Cornea, MD    PREPARATION FOR COLONOSCOPY WITH SUPREP BOWEL PREP KIT  Note: Suprep Bowel Prep Kit is a split-dose (2day) regimen. Consumption of BOTH 6-ounce bottles is required for a complete prep.  Please notify us immediately if you are diabetic, take iron supplements, or if you are on Coumadin or any other blood thinners.  Please hold the following medications:                                                                                                                                                  2 DAYS BEFORE PROCEDURE:  DATE: 01/15/18   DAY: Monday Begin clear liquid diet AFTER your lunch meal. NO SOLID FOODS after this point.  1 DAY BEFORE PROCEDURE:  DATE: 01/16/18   DAY: Tuesday Continue clear liquids the entire day - NO SOLID FOOD.   Diabetic medications adjustments for today: see letter  At 6:00pm: Complete steps 1 through 4 below, using ONE (1) 6-ounce bottle, before going to bed. Step 1:  Pour ONE (1) 6-ounce bottle of SUPREP liquid into the mixing container.  Step 2:  Add cool drinking water to the 16 ounce line on the container and mix.  Note: Dilute the solution concentrate as directed prior to use. Step 3:  DRINK ALL the liquid in the container. Step 4:  You MUST drink an additional two (2) or more 16 ounce containers of water  over the next one (1) hour.   Continue clear liquids only EXCEPTION: If you take medications for your heart, blood pressure, or breathing, you may take these medications with a small amount of clear liquid.   DAY OF PROCEDURE:   DATE: 01/17/18   DAY: Wednesday  Diabetic medications adjustments for today:see letter  5 hours before your procedure at 3:30am: Step 1:  Pour ONE (1) 6-ounce bottle of SUPREP liquid into the mixing container.  Step  2:  Add cool drinking water to the 16 ounce line on the container and mix.  Note: Dilute the solution concentrate as directed prior to use. Step 3:  DRINK ALL the liquid in the container. Step 4:  You MUST drink an additional two (2) or more 16 ounce containers of water  over the next one (1) hour. You MUST complete the final glass of water at least  3 hours before your colonoscopy.   Nothing by mouth past 5:30am  You may take your morning medications with sip of water unless we have instructed otherwise.    Please see below for Dietary Information.  CLEAR LIQUIDS INCLUDE:  Water Jello (NOT red in color)   Ice Popsicles (NOT red in color)   Tea (  sugar ok, no milk/cream) Powdered fruit flavored drinks  Coffee (sugar ok, no milk/cream) Gatorade/ Lemonade/ Kool-Aid  (NOT red in color)   Juice: apple, white grape, white cranberry Soft drinks  Clear bullion, consomme, broth (fat free beef/chicken/vegetable)  Carbonated beverages (any kind)  Strained chicken noodle soup Hard Candy   Remember: Clear liquids are liquids that will allow you to see your fingers on the other side of a clear glass. Be sure liquids are NOT red in color, and not cloudy, but CLEAR.  DO NOT EAT OR DRINK ANY OF THE FOLLOWING:  Dairy products of any kind   Cranberry juice Tomato juice / V8 juice   Grapefruit juice Orange juice     Red grape juice  Do not eat any solid foods, including such foods as: cereal, oatmeal, yogurt, fruits, vegetables, creamed soups, eggs, bread, crackers, pureed foods in a blender, etc.   HELPFUL HINTS FOR DRINKING PREP SOLUTION:   Make sure prep is extremely cold. Mix and refrigerate the the morning of the prep. You may also put in the freezer.   You may try mixing some Crystal Light or Country Time Lemonade if you prefer. Mix in small amounts; add more if necessary.  Try drinking through a straw  Rinse mouth with water or a mouthwash between glasses, to remove after-taste.  Try  sipping on a cold beverage /ice/ popsicles between glasses of prep.  Place a piece of sugar-free hard candy in mouth between glasses.  If you become nauseated, try consuming smaller amounts, or stretch out the time between glasses. Stop for 30-60 minutes, then slowly start back drinking.     OTHER INSTRUCTIONS  You will need a responsible adult at least 65 years of age to accompany you and drive you home. This person must remain in the waiting room during your procedure. The hospital will cancel your procedure if you do not have a responsible adult with you.   1. Wear loose fitting clothing that is easily removed. 2. Leave jewelry and other valuables at home.  3. Remove all body piercing jewelry and leave at home. 4. Total time from sign-in until discharge is approximately 2-3 hours. 5. You should go home directly after your procedure and rest. You can resume normal activities the day after your procedure. 6. The day of your procedure you should not:  Drive  Make legal decisions  Operate machinery  Drink alcohol  Return to work   You may call the office (Dept: [phone]) before 5:00pm, or page the doctor on call [phone]) after 5:00pm, for further instructions, if necessary.   Insurance Information YOU WILL NEED TO CHECK WITH YOUR INSURANCE COMPANY FOR THE BENEFITS OF COVERAGE YOU HAVE FOR THIS PROCEDURE.  UNFORTUNATELY, NOT ALL INSURANCE COMPANIES HAVE BENEFITS TO COVER ALL OR PART OF THESE TYPES OF PROCEDURES.  IT IS YOUR RESPONSIBILITY TO CHECK YOUR BENEFITS, HOWEVER, WE WILL BE GLAD TO ASSIST YOU WITH ANY CODES YOUR INSURANCE COMPANY MAY NEED.    PLEASE NOTE THAT MOST INSURANCE COMPANIES WILL NOT COVER A SCREENING COLONOSCOPY FOR PEOPLE UNDER THE AGE OF 50  IF YOU HAVE BCBS INSURANCE, YOU MAY HAVE BENEFITS FOR A SCREENING COLONOSCOPY BUT IF POLYPS ARE FOUND THE DIAGNOSIS WILL CHANGE AND THEN YOU MAY HAVE A DEDUCTIBLE THAT WILL NEED TO BE MET. SO PLEASE MAKE SURE YOU  CHECK YOUR BENEFITS FOR A SCREENING COLONOSCOPY AS WELL AS A DIAGNOSTIC COLONOSCOPY.

## 2017-11-09 ENCOUNTER — Other Ambulatory Visit: Payer: Self-pay | Admitting: Nurse Practitioner

## 2017-11-09 ENCOUNTER — Encounter: Payer: Self-pay | Admitting: Family Medicine

## 2017-11-09 ENCOUNTER — Ambulatory Visit (INDEPENDENT_AMBULATORY_CARE_PROVIDER_SITE_OTHER): Payer: BLUE CROSS/BLUE SHIELD | Admitting: Family Medicine

## 2017-11-09 VITALS — BP 144/88 | Ht 63.0 in | Wt 197.0 lb

## 2017-11-09 DIAGNOSIS — F320 Major depressive disorder, single episode, mild: Secondary | ICD-10-CM

## 2017-11-09 DIAGNOSIS — Z1231 Encounter for screening mammogram for malignant neoplasm of breast: Secondary | ICD-10-CM

## 2017-11-09 MED ORDER — SERTRALINE HCL 50 MG PO TABS: 50.0000 mg | ORAL_TABLET | Freq: Every day | ORAL | 1 refills | Status: DC

## 2017-11-09 NOTE — Patient Instructions (Signed)
Give [place] update in mid July on how you are doing

## 2017-11-09 NOTE — Progress Notes (Signed)
   Subjective:    Patient ID: Aimee Dixon, female    DOB: [birth year], 67 y.o.   MRN: [id number]  Depression         This is a chronic problem.  The current episode started more than 1 month ago.  Pt here for 6 week recheck on depression. Patient is taking Zoloft 50 mg. Pt denies any side effects or complications.  Patient states her moods are doing better denies being suicidal.  She feels like she is in a much better place. PHQ-9 looks better.  She feels that medicine is benefiting her.  Review of Systems  Psychiatric/Behavioral: Positive for depression.  Patient denies Any suicidal thoughts denies chest tightness shortness of breath vomiting diarrhea lungs clear heart regular HEENT benign extremities no edema skin warm dry    Objective:   Physical Exam  Please see above 15 minutes spent with patient     Assessment & Plan:  Depression-actually doing much better continue current medication follow this for at least 6 months follow-up in the fall If not seeing significant peak improvement over the next 6 to 8 weeks patient can call us back to increase a dose of the medicine to get under better control

## 2017-11-09 NOTE — Progress Notes (Signed)
Ok to schedule.

## 2017-11-10 NOTE — Progress Notes (Signed)
Should she hold her metformin?

## 2017-11-10 NOTE — Progress Notes (Signed)
Last procedure was completed on ConSed. Ok to schedule.  Hold Metformin morning of the procedure.

## 2017-11-13 ENCOUNTER — Telehealth: Payer: Self-pay | Admitting: *Deleted

## 2017-11-13 MED ORDER — PEG 3350-KCL-NA BICARB-NACL 420 G PO SOLR: 4000.0000 mL | ORAL | 0 refills | Status: DC

## 2017-11-13 NOTE — Telephone Encounter (Signed)
LM with pharmacy, asked them to let me know which prep rx was covered to make sure the pt has correct instructions.

## 2017-11-13 NOTE — Telephone Encounter (Signed)
PATIENT CALLED BACK AND SAID HER INSURANCE WAS OK ON THE PRESCRIPTION , THEY GOT IT STRAIGHT

## 2017-11-13 NOTE — Telephone Encounter (Signed)
[phone] patient said that her pharmacy was supposed to let us know her insurance was having issues with the tcs prep that was prescribed.  Is this being worked on or does she need another prep?

## 2017-11-13 NOTE — Telephone Encounter (Signed)
Tried to call pt- NA-LMOM that I would send in new rx and mail her new instructions.

## 2017-11-13 NOTE — Progress Notes (Addendum)
Pt called- prep not covered by her insurance. New rx for trilyte sent in and new instructions mailed to the pt, with DM med instructions.  See phone note.

## 2017-11-13 NOTE — Telephone Encounter (Signed)
New instructions done and mailed to the pt. rx sent to the pharmacy for trilyte.

## 2017-11-13 NOTE — Addendum Note (Signed)
Addended by: Claudina Lick on: 11/13/2017 12:18 PM   Modules accepted: Orders

## 2017-11-14 NOTE — Progress Notes (Signed)
Pt called back, she was able to get suprep, pharmacy verified pt picked up suprep.  I have discarded trilyte instructions and changed instructions back to suprep in this encounter.

## 2017-11-14 NOTE — Telephone Encounter (Signed)
Pharmacist at [facility] called back and left a message, she said the pt was able to pick up the suprep. I have discarded trilyte instructions. Pt has instructions for suprep.

## 2017-11-16 ENCOUNTER — Encounter (HOSPITAL_COMMUNITY): Payer: Self-pay

## 2017-11-16 ENCOUNTER — Ambulatory Visit (HOSPITAL_COMMUNITY)
Admission: RE | Admit: 2017-11-16 | Discharge: 2017-11-16 | Disposition: A | Discharge status: Home / Self Care | Payer: BLUE CROSS/BLUE SHIELD | Source: Ambulatory Visit | Attending: Nurse Practitioner | Admitting: Nurse Practitioner

## 2017-11-16 DIAGNOSIS — Z1231 Encounter for screening mammogram for malignant neoplasm of breast: Secondary | ICD-10-CM | POA: Insufficient documentation

## 2018-01-17 ENCOUNTER — Encounter (HOSPITAL_COMMUNITY)
Admission: RE | Disposition: A | Discharge status: Home / Self Care | Payer: Self-pay | Source: Ambulatory Visit | Attending: Internal Medicine

## 2018-01-17 ENCOUNTER — Encounter (HOSPITAL_COMMUNITY): Payer: Self-pay | Admitting: *Deleted

## 2018-01-17 ENCOUNTER — Ambulatory Visit (HOSPITAL_COMMUNITY)
Admission: RE | Admit: 2018-01-17 | Discharge: 2018-01-17 | Disposition: A | Discharge status: Home / Self Care | Payer: BLUE CROSS/BLUE SHIELD | Source: Ambulatory Visit | Attending: Internal Medicine | Admitting: Internal Medicine

## 2018-01-17 ENCOUNTER — Other Ambulatory Visit: Payer: Self-pay

## 2018-01-17 DIAGNOSIS — Z79899 Other long term (current) drug therapy: Secondary | ICD-10-CM | POA: Diagnosis not present

## 2018-01-17 DIAGNOSIS — E785 Hyperlipidemia, unspecified: Secondary | ICD-10-CM | POA: Insufficient documentation

## 2018-01-17 DIAGNOSIS — I10 Essential (primary) hypertension: Secondary | ICD-10-CM | POA: Diagnosis not present

## 2018-01-17 DIAGNOSIS — D123 Benign neoplasm of transverse colon: Secondary | ICD-10-CM | POA: Insufficient documentation

## 2018-01-17 DIAGNOSIS — Z888 Allergy status to other drugs, medicaments and biological substances status: Secondary | ICD-10-CM | POA: Insufficient documentation

## 2018-01-17 DIAGNOSIS — D124 Benign neoplasm of descending colon: Secondary | ICD-10-CM

## 2018-01-17 DIAGNOSIS — Z1211 Encounter for screening for malignant neoplasm of colon: Secondary | ICD-10-CM | POA: Diagnosis not present

## 2018-01-17 DIAGNOSIS — Z86010 Personal history of colonic polyps: Secondary | ICD-10-CM | POA: Diagnosis not present

## 2018-01-17 HISTORY — PX: COLONOSCOPY: SHX5424

## 2018-01-17 HISTORY — PX: POLYPECTOMY: SHX5525

## 2018-01-17 LAB — GLUCOSE, CAPILLARY: Glucose-Capillary: 102 mg/dL — ABNORMAL HIGH (ref 70–99)

## 2018-01-17 SURGERY — COLONOSCOPY
Anesthesia: Moderate Sedation

## 2018-01-17 MED ORDER — MIDAZOLAM HCL 5 MG/5ML IJ SOLN
INTRAMUSCULAR | Status: AC
  Filled 2018-01-17: qty 5

## 2018-01-17 MED ORDER — STERILE WATER FOR IRRIGATION IR SOLN
Status: DC | PRN
  Administered 2018-01-17: 1.5 mL

## 2018-01-17 MED ORDER — ONDANSETRON HCL 4 MG/2ML IJ SOLN
INTRAMUSCULAR | Status: DC | PRN
  Administered 2018-01-17: 4 mg via INTRAVENOUS

## 2018-01-17 MED ORDER — MIDAZOLAM HCL 5 MG/5ML IJ SOLN
INTRAMUSCULAR | Status: DC | PRN
  Administered 2018-01-17 (×3): 1 mg via INTRAVENOUS
  Administered 2018-01-17: 2 mg via INTRAVENOUS
  Administered 2018-01-17: 1 mg via INTRAVENOUS
  Administered 2018-01-17: 2 mg via INTRAVENOUS

## 2018-01-17 MED ORDER — SODIUM CHLORIDE 0.9 % IV SOLN
INTRAVENOUS | Status: DC
  Administered 2018-01-17: 1000 mL via INTRAVENOUS

## 2018-01-17 MED ORDER — ONDANSETRON HCL 4 MG/2ML IJ SOLN
INTRAMUSCULAR | Status: AC
  Filled 2018-01-17: qty 2

## 2018-01-17 MED ORDER — MEPERIDINE HCL 100 MG/ML IJ SOLN
INTRAMUSCULAR | Status: DC | PRN
  Administered 2018-01-17: 25 mg via INTRAVENOUS

## 2018-01-17 MED ORDER — MEPERIDINE HCL 50 MG/ML IJ SOLN
INTRAMUSCULAR | Status: AC
  Filled 2018-01-17: qty 1

## 2018-01-17 NOTE — Discharge Instructions (Signed)
Colonoscopy Discharge Instructions  Read the instructions outlined below and refer to this sheet in the next few weeks. These discharge instructions provide you with general information on caring for yourself after you leave the hospital. Your doctor may also give you specific instructions. While your treatment has been planned according to the most current medical practices available, unavoidable complications occasionally occur. If you have any problems or questions after discharge, call Dr. Gala Romney at [phone]. ACTIVITY  You may resume your regular activity, but move at a slower pace for the next 24 hours.   Take frequent rest periods for the next 24 hours.   Walking will help get rid of the air and reduce the bloated feeling in your belly (abdomen).   No driving for 24 hours (because of the medicine (anesthesia) used during the test).    Do not sign any important legal documents or operate any machinery for 24 hours (because of the anesthesia used during the test).  NUTRITION  Drink plenty of fluids.   You may resume your normal diet as instructed by your doctor.   Begin with a light meal and progress to your normal diet. Heavy or fried foods are harder to digest and may make you feel sick to your stomach (nauseated).   Avoid alcoholic beverages for 24 hours or as instructed.  MEDICATIONS  You may resume your normal medications unless your doctor tells you otherwise.  WHAT YOU CAN EXPECT TODAY  Some feelings of bloating in the abdomen.   Passage of more gas than usual.   Spotting of blood in your stool or on the toilet paper.  IF YOU HAD POLYPS REMOVED DURING THE COLONOSCOPY:  No aspirin products for 7 days or as instructed.   No alcohol for 7 days or as instructed.   Eat a soft diet for the next 24 hours.  FINDING OUT THE RESULTS OF YOUR TEST Not all test results are available during your visit. If your test results are not back during the visit, make an appointment  with your caregiver to find out the results. Do not assume everything is normal if you have not heard from your caregiver or the medical facility. It is important for you to follow up on all of your test results.  SEEK IMMEDIATE MEDICAL ATTENTION IF:  You have more than a spotting of blood in your stool.   Your belly is swollen (abdominal distention).   You are nauseated or vomiting.   You have a temperature over 101.   You have abdominal pain or discomfort that is severe or gets worse throughout the day.    Colon polyp information provided   Further recommendations to follow pending review of pathology report    PATIENT INSTRUCTIONS POST-ANESTHESIA  IMMEDIATELY FOLLOWING SURGERY:  Do not drive or operate machinery for the first twenty four hours after surgery.  Do not make any important decisions for twenty four hours after surgery or while taking narcotic pain medications or sedatives.  If you develop intractable nausea and vomiting or a severe headache please notify your doctor immediately.  FOLLOW-UP:  Please make an appointment with your surgeon as instructed. You do not need to follow up with anesthesia unless specifically instructed to do so.  WOUND CARE INSTRUCTIONS (if applicable):  Keep a dry clean dressing on the anesthesia/puncture wound site if there is drainage.  Once the wound has quit draining you may leave it open to air.  Generally you should leave the bandage intact for twenty  four hours unless there is drainage.  If the epidural site drains for more than 36-48 hours please call the anesthesia department.  QUESTIONS?:  Please feel free to call your physician or the hospital operator if you have any questions, and they will be happy to assist you.      Colon Polyps Polyps are tissue growths inside the body. Polyps can grow in many places, including the large intestine (colon). A polyp may be a round bump or a mushroom-shaped growth. You could have one polyp or  several. Most colon polyps are noncancerous (benign). However, some colon polyps can become cancerous over time. What are the causes? The exact cause of colon polyps is not known. What increases the risk? This condition is more likely to develop in people who:  Have a family history of colon cancer or colon polyps.  Are older than 62 or older than 45 if they are African American.  Have inflammatory bowel disease, such as ulcerative colitis or Crohn disease.  Are overweight.  Smoke cigarettes.  Do not get enough exercise.  Drink too much alcohol.  Eat a diet that is: ? High in fat and red meat. ? Low in fiber.  Had childhood cancer that was treated with abdominal radiation.  What are the signs or symptoms? Most polyps do not cause symptoms. If you have symptoms, they may include:  Blood coming from your rectum when having a bowel movement.  Blood in your stool.The stool may look dark red or black.  A change in bowel habits, such as constipation or diarrhea.  How is this diagnosed? This condition is diagnosed with a colonoscopy. This is a procedure that uses a lighted, flexible scope to look at the inside of your colon. How is this treated? Treatment for this condition involves removing any polyps that are found. Those polyps will then be tested for cancer. If cancer is found, your health care provider will talk to you about options for colon cancer treatment. Follow these instructions at home: Diet  Eat plenty of fiber, such as fruits, vegetables, and whole grains.  Eat foods that are high in calcium and vitamin D, such as milk, cheese, yogurt, eggs, liver, fish, and broccoli.  Limit foods high in fat, red meats, and processed meats, such as hot dogs, sausage, bacon, and lunch meats.  Maintain a healthy weight, or lose weight if recommended by your health care provider. General instructions  Do not smoke cigarettes.  Do not drink alcohol excessively.  Keep all  follow-up visits as told by your health care provider. This is important. This includes keeping regularly scheduled colonoscopies. Talk to your health care provider about when you need a colonoscopy.  Exercise every day or as told by your health care provider. Contact a health care provider if:  You have new or worsening bleeding during a bowel movement.  You have new or increased blood in your stool.  You have a change in bowel habits.  You unexpectedly lose weight. This information is not intended to replace advice given to you by your health care provider. Make sure you discuss any questions you have with your health care provider.

## 2018-01-17 NOTE — H&P (Signed)
'@LOGO' @   Primary Care Physician:  Kathyrn Drown, MD Primary Gastroenterologist:  Dr. Gala Romney  Pre-Procedure History & Physical: HPI:  Aimee Dixon is a 67 y.o. female here for  A surveillance colonoscopy. History of colonic adenoma. No bowel symptoms currently.  Past Medical History:  Diagnosis Date  . Hyperlipidemia   . Hypertension   . IDA (iron deficiency anemia)   . Tubular adenoma of colon     Past Surgical History:  Procedure Laterality Date  . ABDOMINAL HYSTERECTOMY    . COLONOSCOPY  06/30/2003   BTD:HRCB papilla/Otherwise,normal rectum/Pedunculated polyp at middescending colon/Smaller pedunculated polyp at hepatic flexure, cold snared  . COLONOSCOPY   08/07/2006   ULA:GTXMIWO internal hemorrhoids, otherwise normal rectum/Diminutive ascending colon adenomatous polyps  . COLONOSCOPY  08/03/2009   RMR: tubular adenomas removed from hepatic flexure & desc colon, internal hemorrhoids and anal papilla otherwise norma;  . COLONOSCOPY N/A 10/03/2012   Procedure: COLONOSCOPY;  Surgeon: Daneil Dolin, MD;  Location: [facility];  Service: Endoscopy;  Laterality: N/A;  2:15    Prior to Admission medications   Medication Sig Start Date End Date Taking? Authorizing Provider  acetaminophen (TYLENOL) 500 MG tablet Take 500 mg by mouth every 6 (six) hours as needed.   Yes [provider]  atorvastatin (LIPITOR) 20 MG tablet Take 1 tablet (20 mg total) by mouth daily. 09/21/17  Yes Luking, Elayne Snare, MD  fexofenadine (ALLEGRA) 180 MG tablet Take 180 mg by mouth daily.   Yes [provider]  guaiFENesin (MUCINEX) 600 MG 12 hr tablet Take 600 mg by mouth daily.    Yes [provider]  lisinopril-hydrochlorothiazide (PRINZIDE,ZESTORETIC) 20-25 MG tablet TAKE 1 TABLET BY MOUTH EVERY DAY. Patient taking differently: Take 1 tablet by mouth daily.  09/21/17  Yes Luking, Elayne Snare, MD  metFORMIN (GLUCOPHAGE) 500 MG tablet TAKE 1 TABLET BY MOUTH daily Patient taking  differently: Take 500 mg by mouth once a week.  09/21/17  Yes Luking, Elayne Snare, MD  polyethylene glycol-electrolytes (TRILYTE) 420 g solution Take 4,000 mLs by mouth as directed. 11/13/17  Yes Carlis Stable, NP  sertraline (ZOLOFT) 50 MG tablet Take 1 tablet (50 mg total) by mouth daily. 11/09/17  Yes Kathyrn Drown, MD  albuterol (PROVENTIL HFA;VENTOLIN HFA) 108 (90 Base) MCG/ACT inhaler Inhale 2 puffs into the lungs every 4 (four) hours as needed for wheezing. 06/23/15   Nilda Simmer, NP  Na Sulfate-K Sulfate-Mg Sulf (SUPREP BOWEL PREP KIT) 17.5-3.13-1.6 GM/177ML SOLN Take 1 kit by mouth as directed. Patient not taking: Reported on 01/10/2018 11/08/17   Carlis Stable, NP  triamcinolone cream (KENALOG) 0.1 % Apply 1 application topically 2 (two) times daily. Prn rash; use up to 2 weeks Patient not taking: Reported on 11/09/2017 09/16/14   Nilda Simmer, NP    Allergies as of 11/08/2017 - Review Complete 11/08/2017  Allergen Reaction Noted  . Norvasc [amlodipine besylate]  10/05/2012    Family History  Problem Relation Age of Onset  . Lung cancer Father   . Breast cancer Mother 22  . Colon cancer Neg Hx     Social History   Socioeconomic History  . Marital status: Married    Spouse name: Not on file  . Number of children: 1  . Years of education: Not on file  . Highest education level: Not on file  Occupational History  . Occupation: Medical laboratory scientific officer: [employer]  Social Needs  .  Financial resource strain: Not on file  . Food insecurity:    Worry: Not on file    Inability: Not on file  . Transportation needs:    Medical: Not on file    Non-medical: Not on file  Tobacco Use  . Smoking status: Never Smoker  . Smokeless tobacco: Never Used  Substance and Sexual Activity  . Alcohol use: No  . Drug use: No  . Sexual activity: Yes    Birth control/protection: Surgical  Lifestyle  . Physical activity:    Days per week: Not on file    Minutes per session:  Not on file  . Stress: Not on file  Relationships  . Social connections:    Talks on phone: Not on file    Gets together: Not on file    Attends religious service: Not on file    Active member of club or organization: Not on file    Attends meetings of clubs or organizations: Not on file    Relationship status: Not on file  . Intimate partner violence:    Fear of current or ex partner: Not on file    Emotionally abused: Not on file    Physically abused: Not on file    Forced sexual activity: Not on file  Other Topics Concern  . Not on file  Social History Narrative   Lives w/ husband    Review of Systems: See HPI, otherwise negative ROS  Physical Exam: BP (!) 151/71   Pulse 73   Temp 99.1 F (37.3 C) (Oral)   Resp 18   Ht '5\' 2"'  (1.575 m)   Wt 88.9 kg   SpO2 99%   BMI 35.85 kg/m  General:   Alert,  Well-developed, well-nourished, pleasant and cooperative in NAD Neck:  Supple; no masses or thyromegaly. No significant cervical adenopathy. Lungs:  Clear throughout to auscultation.   No wheezes, crackles, or rhonchi. No acute distress. Heart:  Regular rate and rhythm; no murmurs, clicks, rubs,  or gallops. Abdomen: Non-distended, normal bowel sounds.  Soft and nontender without appreciable mass or hepatosplenomegaly.  Pulses:  Normal pulses noted. Extremities:  Without clubbing or edema.  Impression/Plan:  67 year old lady with a distant  History of colonic adenoma.Here for surveillance colonoscopy.  The risks, benefits, limitations, alternatives and imponderables have been reviewed with the patient. Questions have been answered. All parties are agreeable.      Notice: This dictation was prepared with Dragon dictation along with smaller phrase technology. Any transcriptional errors that result from this process are unintentional and may not be corrected upon review.

## 2018-01-17 NOTE — Op Note (Signed)
[facility] Patient Name: Aimee Dixon Procedure Date: 01/17/2018 7:03 AM MRN: [id number] Date of Birth: 12/25/1950 Attending MD: Norvel Richards , MD CSN: [id number] Age: 67 Admit Type: Outpatient Procedure:                Colonoscopy Indications:              High risk colon cancer surveillance: Personal                            history of colonic polyps Providers:                Norvel Richards, MD, Lurline Del, RN, Aram Candela Referring MD:              Medicines:                Midazolam 8 mg IV, Meperidine 25 mg IV, Ondansetron                            4 mg IV Complications:            No immediate complications. Estimated Blood Loss:     Estimated blood loss was minimal. Procedure:                Pre-Anesthesia Assessment:                           - Prior to the procedure, a History and Physical                            was performed, and patient medications and                            allergies were reviewed. The patient's tolerance of                            previous anesthesia was also reviewed. The risks                            and benefits of the procedure and the sedation                            options and risks were discussed with the patient.                            All questions were answered, and informed consent                            was obtained. Prior Anticoagulants: The patient has                            taken no previous anticoagulant or antiplatelet  agents. ASA Grade Assessment: II - A patient with                            mild systemic disease. After reviewing the risks                            and benefits, the patient was deemed in                            satisfactory condition to undergo the procedure.                           After obtaining informed consent, the colonoscope                            was passed under direct vision. Throughout the                             procedure, the patient's blood pressure, pulse, and                            oxygen saturations were monitored continuously. The                            CF-HQ190L ([id number]) scope was introduced through                            the anus and advanced to the the cecum, identified                            by appendiceal orifice and ileocecal valve. The                            colonoscopy was performed without difficulty. The                            patient tolerated the procedure well. The quality                            of the bowel preparation was adequate. The entire                            colon was well visualized. Scope In: 9:22:22 AM Scope Out: 9:41:37 AM Scope Withdrawal Time: 0 hours 8 minutes 38 seconds  Total Procedure Duration: 0 hours 19 minutes 15 seconds  Findings:      The perianal and digital rectal examinations were normal.      Three semi-pedunculated polyps were found in the descending colon and       hepatic flexure. The polyps were 4 to 7 mm in size. These polyps were       removed with a cold snare. Resection and retrieval were complete.       Estimated blood loss was minimal.      The exam was otherwise without abnormality on direct and retroflexion  views. Impression:               - Three 4 to 7 mm polyps in the descending colon                            and at the hepatic flexure, removed with a cold                            snare. Resected and retrieved.                           - The examination was otherwise normal on direct                            and retroflexion views. Moderate Sedation:      Moderate (conscious) sedation was administered by the endoscopy nurse       and supervised by the endoscopist. The following parameters were       monitored: oxygen saturation, heart rate, blood pressure, respiratory       rate, EKG, adequacy of pulmonary ventilation, and response to care.       Total  physician intraservice time was 14 minutes. Recommendation:           - Patient has a contact number available for                            emergencies. The signs and symptoms of potential                            delayed complications were discussed with the                            patient. Return to normal activities tomorrow.                            Written discharge instructions were provided to the                            patient.                           - Resume previous diet.                           - Continue present medications.                           - Repeat colonoscopy date to be determined after                            pending pathology results are reviewed for                            surveillance.                           - Return to GI office (  date not yet determined). Procedure Code(s):        --- Professional ---                           [phone], Colonoscopy, flexible; with removal of                            tumor(s), polyp(s), or other lesion(s) by snare                            technique                           G0500, Moderate sedation services provided by the                            same physician or other qualified health care                            professional performing a gastrointestinal                            endoscopic service that sedation supports,                            requiring the presence of an independent trained                            observer to assist in the monitoring of the                            patient's level of consciousness and physiological                            status; initial 15 minutes of intra-service time;                            patient age 65 years or older (additional time may                            be reported with [phone], as appropriate) Diagnosis Code(s):        --- Professional ---                           Z86.010, Personal history of colonic polyps                            D12.4, Benign neoplasm of descending colon                           D12.3, Benign neoplasm of transverse colon (hepatic                            flexure or splenic flexure) CPT copyright 2017 American Medical Association. All rights reserved. The codes documented in this  report are preliminary and upon coder review may  be revised to meet current compliance requirements. Cristopher Estimable. Grete Bosko, MD Norvel Richards, MD 01/17/2018 9:49:09 AM This report has been signed electronically. Number of Addenda: 0

## 2018-01-22 ENCOUNTER — Encounter: Payer: Self-pay | Admitting: Internal Medicine

## 2018-01-22 ENCOUNTER — Encounter (HOSPITAL_COMMUNITY): Payer: Self-pay | Admitting: Internal Medicine

## 2018-02-28 ENCOUNTER — Ambulatory Visit (INDEPENDENT_AMBULATORY_CARE_PROVIDER_SITE_OTHER): Payer: BLUE CROSS/BLUE SHIELD | Admitting: Family Medicine

## 2018-02-28 ENCOUNTER — Encounter: Payer: Self-pay | Admitting: Family Medicine

## 2018-02-28 VITALS — BP 130/90 | Ht 62.0 in | Wt 190.8 lb

## 2018-02-28 DIAGNOSIS — F324 Major depressive disorder, single episode, in partial remission: Secondary | ICD-10-CM | POA: Diagnosis not present

## 2018-02-28 DIAGNOSIS — R7303 Prediabetes: Secondary | ICD-10-CM

## 2018-02-28 DIAGNOSIS — I10 Essential (primary) hypertension: Secondary | ICD-10-CM

## 2018-02-28 DIAGNOSIS — E782 Mixed hyperlipidemia: Secondary | ICD-10-CM

## 2018-02-28 LAB — POCT GLYCOSYLATED HEMOGLOBIN (HGB A1C): Hemoglobin A1C: 5.3 % (ref 4.0–5.6)

## 2018-02-28 MED ORDER — ATORVASTATIN CALCIUM 20 MG PO TABS: 20.0000 mg | ORAL_TABLET | Freq: Every day | ORAL | 1 refills | Status: DC

## 2018-02-28 MED ORDER — LISINOPRIL-HYDROCHLOROTHIAZIDE 20-25 MG PO TABS: 1.0000 | ORAL_TABLET | Freq: Every day | ORAL | 1 refills | Status: DC

## 2018-02-28 MED ORDER — SERTRALINE HCL 100 MG PO TABS: 100.0000 mg | ORAL_TABLET | Freq: Every day | ORAL | 1 refills | Status: DC

## 2018-02-28 NOTE — Progress Notes (Signed)
Subjective:    Patient ID: Aimee Dixon, female    DOB: Oct 03, 1950, 67 y.o.   MRN: [id number]  Diabetes  She presents for her follow-up diabetic visit. Diabetes type: prediabetes. Pertinent negatives for hypoglycemia include no confusion or dizziness. Pertinent negatives for diabetes include no chest pain, no fatigue, no polydipsia, no polyphagia and no weakness.  Depression         This is a chronic problem.  Associated symptoms include no fatigue and no appetite change. Patient for blood pressure check up.  The patient does have hypertension.  The patient is on medication.  Patient relates compliance with meds. Todays BP reviewed with the patient. Patient denies issues with medication. Patient relates reasonable diet. Patient tries to minimize salt. Patient aware of BP goals.  Patient here for follow-up regarding cholesterol.  The patient does have hyperlipidemia.  Patient does try to maintain a reasonable diet.  Patient does take the medication on a regular basis.  Denies missing a dose.  The patient denies any obvious side effects.  Prior blood work results reviewed with the patient.  The patient is aware of his cholesterol goals and the need to keep it under good control to lessen the risk of disease.  Results for orders placed or performed in visit on 02/28/18  POCT HgB A1C  Result Value Ref Range   Hemoglobin A1C 5.3 4.0 - 5.6 %   HbA1c POC (<> result, manual entry)     HbA1c, POC (prediabetic range)     HbA1c, POC (controlled diabetic range)      Review of Systems  Constitutional: Negative for activity change, appetite change and fatigue.  HENT: Negative for congestion and rhinorrhea.   Respiratory: Negative for cough and shortness of breath.   Cardiovascular: Negative for chest pain and leg swelling.  Gastrointestinal: Negative for abdominal pain and diarrhea.  Endocrine: Negative for polydipsia and polyphagia.  Skin: Negative for color change.  Neurological: Negative for  dizziness and weakness.  Psychiatric/Behavioral: Positive for depression. Negative for behavioral problems and confusion.       Objective:   Physical Exam  Constitutional: She appears well-nourished. No distress.  HENT:  Head: Normocephalic and atraumatic.  Eyes: Right eye exhibits no discharge. Left eye exhibits no discharge.  Neck: No tracheal deviation present.  Cardiovascular: Normal rate, regular rhythm and normal heart sounds.  No murmur heard. Pulmonary/Chest: Effort normal and breath sounds normal. No respiratory distress.  Musculoskeletal: She exhibits no edema.  Lymphadenopathy:    She has no cervical adenopathy.  Neurological: She is alert. Coordination normal.  Skin: Skin is warm and dry.  Psychiatric: She has a normal mood and affect. Her behavior is normal.  Vitals reviewed.         Assessment & Plan:  Depression partial remission increase a dose of medication if not seeing significant improvement within the next 6 weeks notify us otherwise follow-up within 6 months  Prediabetes good control no need for medication watch diet only  HTN- Patient was seen today as part of a visit regarding hypertension. The importance of healthy diet and regular physical activity was discussed. The importance of compliance with medications discussed.  Ideal goal is to keep blood pressure low elevated levels certainly below 563/87 when possible.  The patient was counseled that keeping blood pressure under control lessen his risk of complications.  The importance of regular follow-ups was discussed with the patient.  Low-salt diet such as DASH recommended.  Regular physical activity was recommended  as well.  Patient was advised to keep regular follow-ups.  The patient was seen today as part of an evaluation regarding hyperlipidemia.  Recent lab work has been reviewed with the patient as well as the goals for good cholesterol care.  In addition to this medications have been  discussed the importance of compliance with diet and medications discussed as well.  Finally the patient is aware that poor control of cholesterol, noncompliance can dramatically increase the risk of complications. The patient will keep regular office visits and the patient does agreed to periodic lab work.  Patient states her workplace will do lab work she will fax it to [place] Patient gets her female health exam on a yearly basis

## 2018-02-28 NOTE — Patient Instructions (Signed)
DASH Eating Plan DASH stands for "Dietary Approaches to Stop Hypertension." The DASH eating plan is a healthy eating plan that has been shown to reduce high blood pressure (hypertension). It may also reduce your risk for type 2 diabetes, heart disease, and stroke. The DASH eating plan may also help with weight loss. What are tips for following this plan? General guidelines  Avoid eating more than 2,300 mg (milligrams) of salt (sodium) a day. If you have hypertension, you may need to reduce your sodium intake to 1,500 mg a day.  Limit alcohol intake to no more than 1 drink a day for nonpregnant women and 2 drinks a day for men. One drink equals 12 oz of beer, 5 oz of wine, or 1 oz of hard liquor.  Work with your health care provider to maintain a healthy body weight or to lose weight. Ask what an ideal weight is for you.  Get at least 30 minutes of exercise that causes your heart to beat faster (aerobic exercise) most days of the week. Activities may include walking, swimming, or biking.  Work with your health care provider or diet and nutrition specialist (dietitian) to adjust your eating plan to your individual calorie needs. Reading food labels  Check food labels for the amount of sodium per serving. Choose foods with less than 5 percent of the Daily Value of sodium. Generally, foods with less than 300 mg of sodium per serving fit into this eating plan.  To find whole grains, look for the word "whole" as the first word in the ingredient list. Shopping  Buy products labeled as "low-sodium" or "no salt added."  Buy fresh foods. Avoid canned foods and premade or frozen meals. Cooking  Avoid adding salt when cooking. Use salt-free seasonings or herbs instead of table salt or sea salt. Check with your health care provider or pharmacist before using salt substitutes.  Do not fry foods. Cook foods using healthy methods such as baking, boiling, grilling, and broiling instead.  Cook with  heart-healthy oils, such as olive, canola, soybean, or sunflower oil. Meal planning   Eat a balanced diet that includes: ? 5 or more servings of fruits and vegetables each day. At each meal, try to fill half of your plate with fruits and vegetables. ? Up to 6-8 servings of whole grains each day. ? Less than 6 oz of lean meat, poultry, or fish each day. A 3-oz serving of meat is about the same size as a deck of cards. One egg equals 1 oz. ? 2 servings of low-fat dairy each day. ? A serving of nuts, seeds, or beans 5 times each week. ? Heart-healthy fats. Healthy fats called Omega-3 fatty acids are found in foods such as flaxseeds and coldwater fish, like sardines, salmon, and mackerel.  Limit how much you eat of the following: ? Canned or prepackaged foods. ? Food that is high in trans fat, such as fried foods. ? Food that is high in saturated fat, such as fatty meat. ? Sweets, desserts, sugary drinks, and other foods with added sugar. ? Full-fat dairy products.  Do not salt foods before eating.  Try to eat at least 2 vegetarian meals each week.  Eat more home-cooked food and less restaurant, buffet, and fast food.  When eating at a restaurant, ask that your food be prepared with less salt or no salt, if possible. What foods are recommended? The items listed may not be a complete list. Talk with your dietitian about what   dietary choices are best for you. Grains Whole-grain or whole-wheat bread. Whole-grain or whole-wheat pasta. Brown rice. Oatmeal. Quinoa. Bulgur. Whole-grain and low-sodium cereals. Pita bread. Low-fat, low-sodium crackers. Whole-wheat flour tortillas. Vegetables Fresh or frozen vegetables (raw, steamed, roasted, or grilled). Low-sodium or reduced-sodium tomato and vegetable juice. Low-sodium or reduced-sodium tomato sauce and tomato paste. Low-sodium or reduced-sodium canned vegetables. Fruits All fresh, dried, or frozen fruit. Canned fruit in natural juice (without  added sugar). Meat and other protein foods Skinless chicken or turkey. Ground chicken or turkey. Pork with fat trimmed off. Fish and seafood. Egg whites. Dried beans, peas, or lentils. Unsalted nuts, nut butters, and seeds. Unsalted canned beans. Lean cuts of beef with fat trimmed off. Low-sodium, lean deli meat. Dairy Low-fat (1%) or fat-free (skim) milk. Fat-free, low-fat, or reduced-fat cheeses. Nonfat, low-sodium ricotta or cottage cheese. Low-fat or nonfat yogurt. Low-fat, low-sodium cheese. Fats and oils Soft margarine without trans fats. Vegetable oil. Low-fat, reduced-fat, or light mayonnaise and salad dressings (reduced-sodium). Canola, safflower, olive, soybean, and sunflower oils. Avocado. Seasoning and other foods Herbs. Spices. Seasoning mixes without salt. Unsalted popcorn and pretzels. Fat-free sweets. What foods are not recommended? The items listed may not be a complete list. Talk with your dietitian about what dietary choices are best for you. Grains Baked goods made with fat, such as croissants, muffins, or some breads. Dry pasta or rice meal packs. Vegetables Creamed or fried vegetables. Vegetables in a cheese sauce. Regular canned vegetables (not low-sodium or reduced-sodium). Regular canned tomato sauce and paste (not low-sodium or reduced-sodium). Regular tomato and vegetable juice (not low-sodium or reduced-sodium). Pickles. Olives. Fruits Canned fruit in a light or heavy syrup. Fried fruit. Fruit in cream or butter sauce. Meat and other protein foods Fatty cuts of meat. Ribs. Fried meat. Bacon. Sausage. Bologna and other processed lunch meats. Salami. Fatback. Hotdogs. Bratwurst. Salted nuts and seeds. Canned beans with added salt. Canned or smoked fish. Whole eggs or egg yolks. Chicken or turkey with skin. Dairy Whole or 2% milk, cream, and half-and-half. Whole or full-fat cream cheese. Whole-fat or sweetened yogurt. Full-fat cheese. Nondairy creamers. Whipped toppings.  Processed cheese and cheese spreads. Fats and oils Butter. Stick margarine. Lard. Shortening. Ghee. Bacon fat. Tropical oils, such as coconut, palm kernel, or palm oil. Seasoning and other foods Salted popcorn and pretzels. Onion salt, garlic salt, seasoned salt, table salt, and sea salt. Worcestershire sauce. Tartar sauce. Barbecue sauce. Teriyaki sauce. Soy sauce, including reduced-sodium. Steak sauce. Canned and packaged gravies. Fish sauce. Oyster sauce. Cocktail sauce. Horseradish that you find on the shelf. Ketchup. Mustard. Meat flavorings and tenderizers. Bouillon cubes. Hot sauce and Tabasco sauce. Premade or packaged marinades. Premade or packaged taco seasonings. Relishes. Regular salad dressings. Where to find more information:  National Heart, Lung, and Blood Institute: www.nhlbi.nih.gov  American Heart Association: www.heart.org Summary  The DASH eating plan is a healthy eating plan that has been shown to reduce high blood pressure (hypertension). It may also reduce your risk for type 2 diabetes, heart disease, and stroke.  With the DASH eating plan, you should limit salt (sodium) intake to 2,300 mg a day. If you have hypertension, you may need to reduce your sodium intake to 1,500 mg a day.  When on the DASH eating plan, aim to eat more fresh fruits and vegetables, whole grains, lean proteins, low-fat dairy, and heart-healthy fats.  Work with your health care provider or diet and nutrition specialist (dietitian) to adjust your eating plan to your individual   calorie needs. This information is not intended to replace advice given to you by your health care provider. Make sure you discuss any questions you have with your health care provider. Document Released: 05/05/2011 Document Revised: 05/09/2016 Document Reviewed: 05/09/2016 Elsevier Interactive Patient Education  2018 Elsevier Inc.  

## 2018-03-07 ENCOUNTER — Encounter: Payer: Self-pay | Admitting: Family Medicine

## 2018-03-07 ENCOUNTER — Ambulatory Visit (INDEPENDENT_AMBULATORY_CARE_PROVIDER_SITE_OTHER): Payer: BLUE CROSS/BLUE SHIELD | Admitting: Family Medicine

## 2018-03-07 VITALS — BP 134/88 | Ht 62.0 in | Wt 190.2 lb

## 2018-03-07 DIAGNOSIS — Z0000 Encounter for general adult medical examination without abnormal findings: Secondary | ICD-10-CM | POA: Diagnosis not present

## 2018-03-07 MED ORDER — ZOSTER VAC RECOMB ADJUVANTED 50 MCG/0.5ML IM SUSR: 0.5000 mL | Freq: Once | INTRAMUSCULAR | 1 refills | Status: AC

## 2018-03-07 NOTE — Progress Notes (Signed)
Subjective:    Patient ID: Aimee Dixon, female    DOB: Jul 28, 1950, 67 y.o.   MRN: [id number]  HPI The patient comes in today for a wellness visit.  A review of their health history was completed. A review of medications was also completed.  Any needed refills; not at this time  Eating habits: health conscious, is working on decreasing carbs and sugar intake d/t prediabetes diagnosis, this is well-controlled currently with diet.   Falls/  MVA accidents in past few months: none  Regular exercise: does not have time but reports her job is active  Specialist pt sees on regular basis: none Gets regular dental and vision care.  Preventative health issues were discussed.   Additional concerns: none  UTD on mammogram and colonoscopy. Will get lab work and flu shot today at work.  PHQ-9 reviewed, pt reports mood is stable and improving since dose of zoloft was increased last week. 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    Review of Systems  Constitutional: Negative for chills, fatigue, fever and unexpected weight change.  HENT: Negative for congestion, ear pain, sinus pressure, sinus pain and sore throat.   Eyes: Negative for discharge and visual disturbance.  Respiratory: Negative for cough, shortness of breath and wheezing.   Cardiovascular: Negative for chest pain and leg swelling.  Gastrointestinal: Negative for abdominal pain, blood in stool, constipation, diarrhea, nausea and vomiting.  Genitourinary: Negative for difficulty urinating and hematuria.  Skin: Negative for color change.  Neurological: Negative for dizziness, weakness, light-headedness and headaches.  Hematological: Negative for adenopathy.  Psychiatric/Behavioral: Negative for dysphoric mood, sleep disturbance and suicidal ideas.  All other systems  reviewed and are negative.      Objective:   Physical Exam  Constitutional: She is oriented to person, place, and time. She appears well-developed and well-nourished. No distress.  HENT:  Head: Normocephalic and atraumatic.  Right Ear: Tympanic membrane normal.  Left Ear: Tympanic membrane normal.  Nose: Nose normal.  Mouth/Throat: Uvula is midline and oropharynx is clear and moist.  Eyes: Pupils are equal, round, and reactive to light. Conjunctivae and EOM are normal. Right eye exhibits no discharge. Left eye exhibits no discharge.  Neck: Neck supple. No thyromegaly present.  Cardiovascular: Normal rate, regular rhythm and normal heart sounds.  No murmur heard. Pulmonary/Chest: Effort normal and breath sounds normal. No respiratory distress. She has no wheezes.  Abdominal: Soft. Bowel sounds are normal. She exhibits no distension and no mass. There is no tenderness.  Genitourinary: Vagina normal. There is no rash, tenderness or lesion on the right labia. There is no rash, tenderness or lesion on the left labia. Right adnexum displays no mass and no tenderness. Left adnexum displays no mass and no tenderness. No tenderness in the vagina. No vaginal discharge found.  Genitourinary Comments: hysterectomy  Musculoskeletal: She exhibits no tenderness or deformity.  Lymphadenopathy:    She has no cervical adenopathy.  Neurological: She is alert and oriented to person, place, and time. Coordination normal.  Skin: Skin is warm and dry.  Psychiatric: She has a normal mood and affect. Her behavior is normal. Judgment and thought content normal.  Nursing note and vitals reviewed.      Assessment & Plan:  Well adult exam  Adult wellness-complete.wellness physical was conducted today. Importance of diet and exercise were discussed in detail.  In addition to this a discussion regarding safety was also covered. We also reviewed over immunizations and gave recommendations regarding current  immunization needed for age.   -Pt declined pna vaccine  -Shingrix rx given, she will check pricing at her pharmacy In addition to this additional areas were also touched on including: Preventative health exams needed:  Colonoscopy and Mammogram done this year 2019.   She will get lab work and flu shot done today at her work. She will send her results to [place] for review.   Patient was advised yearly wellness exam and to keep all scheduled f/u appt.

## 2018-03-19 DIAGNOSIS — H35371 Puckering of macula, right eye: Secondary | ICD-10-CM | POA: Diagnosis not present

## 2018-03-23 ENCOUNTER — Telehealth: Payer: Self-pay | Admitting: Family Medicine

## 2018-03-23 NOTE — Telephone Encounter (Signed)
Pt dropped off her lab results for Dr. Nicki Reaper to review  In red folder in basket on wall

## 2018-03-24 ENCOUNTER — Encounter: Payer: Self-pay | Admitting: Family Medicine

## 2018-03-24 NOTE — Telephone Encounter (Signed)
I reviewed over [place] letter

## 2018-06-08 DIAGNOSIS — H5203 Hypermetropia, bilateral: Secondary | ICD-10-CM | POA: Diagnosis not present

## 2018-06-08 DIAGNOSIS — H2513 Age-related nuclear cataract, bilateral: Secondary | ICD-10-CM | POA: Diagnosis not present

## 2018-06-08 DIAGNOSIS — E119 Type 2 diabetes mellitus without complications: Secondary | ICD-10-CM | POA: Diagnosis not present

## 2018-06-08 DIAGNOSIS — H35371 Puckering of macula, right eye: Secondary | ICD-10-CM | POA: Diagnosis not present

## 2018-09-03 ENCOUNTER — Other Ambulatory Visit: Payer: Self-pay | Admitting: Family Medicine

## 2018-09-04 ENCOUNTER — Ambulatory Visit (INDEPENDENT_AMBULATORY_CARE_PROVIDER_SITE_OTHER): Payer: BLUE CROSS/BLUE SHIELD | Admitting: Family Medicine

## 2018-09-04 ENCOUNTER — Other Ambulatory Visit: Payer: Self-pay

## 2018-09-04 DIAGNOSIS — E782 Mixed hyperlipidemia: Secondary | ICD-10-CM

## 2018-09-04 DIAGNOSIS — I10 Essential (primary) hypertension: Secondary | ICD-10-CM

## 2018-09-04 MED ORDER — LISINOPRIL-HYDROCHLOROTHIAZIDE 20-25 MG PO TABS: 1.0000 | ORAL_TABLET | Freq: Every day | ORAL | 1 refills | Status: DC

## 2018-09-04 MED ORDER — SERTRALINE HCL 100 MG PO TABS: ORAL_TABLET | ORAL | 1 refills | Status: DC

## 2018-09-04 MED ORDER — AZELASTINE HCL 0.1 % NA SOLN: 2.0000 | Freq: Two times a day (BID) | NASAL | 12 refills | Status: DC

## 2018-09-04 MED ORDER — ATORVASTATIN CALCIUM 20 MG PO TABS: 20.0000 mg | ORAL_TABLET | Freq: Every day | ORAL | 1 refills | Status: DC

## 2018-09-04 NOTE — Progress Notes (Signed)
   Subjective:    Patient ID: Aimee Dixon, female    DOB: December 20, 1950, 68 y.o.   MRN: [id number]  HPI  Patient is calling for a follow up on her medications. Patient is currently on medication for depression, blood pressure and cholesterol and states she is doing well on her medications with no problems. Patient relates taking regular cholesterol medicine I reviewed over her labs from last fall no labs necessary She does not had to use the albuterol on a regular basis She does have significant allergy aspects is requesting additional medicine She does take her blood pressure medicine on a regular basis minimizes salt intake and checks her blood pressure and states is been in good control recently Her moods have done well she is continued taking her medicine on a regular basis denies being depressed Virtual Visit via Video Note Video was not possible this patient telephone only. I connected with Aimee Dixon on 09/04/18 at 10:00 AM EDT by a video enabled telemedicine application and verified that I am speaking with the correct person using two identifiers.   I discussed the limitations of evaluation and management by telemedicine and the availability of in person appointments. The patient expressed understanding and agreed to proceed.  History of Present Illness:    Observations/Objective:   Assessment and Plan:   Follow Up Instructions:    I discussed the assessment and treatment plan with the patient. The patient was provided an opportunity to ask questions and all were answered. The patient agreed with the plan and demonstrated an understanding of the instructions.   The patient was advised to call back or seek an in-person evaluation if the symptoms worsen or if the condition fails to improve as anticipated.  I provided 15 minutes of non-face-to-face time during this encounter.     Review of Systems     Objective:   Physical Exam  Physical exam not possible via  video/via telephone      Assessment & Plan:  HTN good control continue current measures refills given follow-up 6 months Continue cholesterol medicine check lab work in the fall can follow-up 6 months Zoloft on a regular basis follow-up if ongoing troubles Follow-up 6 months

## 2018-11-12 ENCOUNTER — Other Ambulatory Visit (HOSPITAL_COMMUNITY): Payer: Self-pay | Admitting: Family Medicine

## 2018-11-12 DIAGNOSIS — Z1231 Encounter for screening mammogram for malignant neoplasm of breast: Secondary | ICD-10-CM

## 2018-11-21 ENCOUNTER — Other Ambulatory Visit: Payer: Self-pay

## 2018-11-21 ENCOUNTER — Ambulatory Visit (HOSPITAL_COMMUNITY)
Admission: RE | Admit: 2018-11-21 | Discharge: 2018-11-21 | Disposition: A | Discharge status: Home / Self Care | Payer: BC Managed Care – PPO | Source: Ambulatory Visit | Attending: Family Medicine | Admitting: Family Medicine

## 2018-11-21 DIAGNOSIS — Z1231 Encounter for screening mammogram for malignant neoplasm of breast: Secondary | ICD-10-CM | POA: Diagnosis not present

## 2019-03-02 ENCOUNTER — Other Ambulatory Visit: Payer: Self-pay | Admitting: Family Medicine

## 2019-03-05 NOTE — Telephone Encounter (Signed)
May have 90-day needs a follow-up office visit or virtual visit this fall

## 2019-03-28 ENCOUNTER — Other Ambulatory Visit: Payer: Self-pay | Admitting: Family Medicine

## 2019-03-31 NOTE — Telephone Encounter (Signed)
This patient may have a 90-day refill on these but she also needs to go ahead and set up an office visit for this fall this can be virtual

## 2019-04-01 NOTE — Telephone Encounter (Signed)
Please contact pt to set up appt then send back to nurses to refill. Thank you

## 2019-04-02 ENCOUNTER — Encounter: Payer: Self-pay | Admitting: Family Medicine

## 2019-04-03 NOTE — Telephone Encounter (Signed)
No answer

## 2019-04-05 NOTE — Telephone Encounter (Signed)
PATIENT SCHEDULED FOR 04/16/19.  PLEASE SEND IN REFILLS.

## 2019-04-16 ENCOUNTER — Other Ambulatory Visit: Payer: Self-pay

## 2019-04-16 ENCOUNTER — Ambulatory Visit (INDEPENDENT_AMBULATORY_CARE_PROVIDER_SITE_OTHER): Payer: BC Managed Care – PPO | Admitting: Family Medicine

## 2019-04-16 DIAGNOSIS — R7303 Prediabetes: Secondary | ICD-10-CM

## 2019-04-16 DIAGNOSIS — I10 Essential (primary) hypertension: Secondary | ICD-10-CM | POA: Diagnosis not present

## 2019-04-16 DIAGNOSIS — Z79899 Other long term (current) drug therapy: Secondary | ICD-10-CM

## 2019-04-16 DIAGNOSIS — E782 Mixed hyperlipidemia: Secondary | ICD-10-CM | POA: Diagnosis not present

## 2019-04-16 MED ORDER — ATORVASTATIN CALCIUM 20 MG PO TABS: ORAL_TABLET | ORAL | 1 refills | Status: DC

## 2019-04-16 MED ORDER — LISINOPRIL-HYDROCHLOROTHIAZIDE 20-25 MG PO TABS: 1.0000 | ORAL_TABLET | Freq: Every day | ORAL | 1 refills | Status: DC

## 2019-04-16 MED ORDER — ALBUTEROL SULFATE HFA 108 (90 BASE) MCG/ACT IN AERS: 2.0000 | INHALATION_SPRAY | RESPIRATORY_TRACT | 3 refills | Status: AC | PRN

## 2019-04-16 MED ORDER — SERTRALINE HCL 100 MG PO TABS: ORAL_TABLET | ORAL | 1 refills | Status: DC

## 2019-04-16 NOTE — Addendum Note (Signed)
Addended by: Carmelina Noun on: 04/16/2019 01:27 PM   Modules accepted: Orders

## 2019-04-16 NOTE — Progress Notes (Signed)
   Subjective:    Patient ID: Aimee Dixon, female    DOB: 22-Dec-1950, 68 y.o.   MRN: [id number]  Hyperlipidemia This is a chronic problem. Pertinent negatives include no chest pain or shortness of breath. Treatments tried: atorvastatin 20mg . There are no compliance problems (takes meds every day, eats healthy, no regular exercise but has a physical job that keeps her active).   Patient does take her blood pressure medicine tries watch salt in the diet works a lot of hours unable to do much in way of exercise Allergies been under good control recently. Moods are doing well with the Zoloft she would like to continue this. Needs refill for albuterol inhaler. She has lost hers.   Virtual Visit via Telephone Note  I connected with Aimee Dixon on 04/16/19 at 11:00 AM EST by telephone and verified that I am speaking with the correct person using two identifiers.  Location: Patient: home Provider: office   I discussed the limitations, risks, security and privacy concerns of performing an evaluation and management service by telephone and the availability of in person appointments. I also discussed with the patient that there may be a patient responsible charge related to this service. The patient expressed understanding and agreed to proceed.   History of Present Illness:    Observations/Objective:   Assessment and Plan:   Follow Up Instructions:    I discussed the assessment and treatment plan with the patient. The patient was provided an opportunity to ask questions and all were answered. The patient agreed with the plan and demonstrated an understanding of the instructions.   The patient was advised to call back or seek an in-person evaluation if the symptoms worsen or if the condition fails to improve as anticipated.  I provided 15 minutes of non-face-to-face time during this encounter.       Review of Systems  Constitutional: Negative for activity change, appetite  change and fatigue.  HENT: Negative for congestion and rhinorrhea.   Respiratory: Negative for cough and shortness of breath.   Cardiovascular: Negative for chest pain and leg swelling.  Gastrointestinal: Negative for abdominal pain and diarrhea.  Endocrine: Negative for polydipsia and polyphagia.  Skin: Negative for color change.  Neurological: Negative for dizziness and weakness.  Psychiatric/Behavioral: Negative for behavioral problems and confusion.       Objective:   Physical Exam   Today's visit was via telephone Physical exam was not possible for this visit      Assessment & Plan:  Hyperlipidemia she will do labs in the near future continue medication Blood pressure good control reportedly Watch diet minimize salt stay active doing walking take medication 15 minutes was spent with patient today discussing healthcare issues which they came.  More than 50% of this visit-total duration of visit-was spent in counseling and coordination of care.  Please see diagnosis regarding the focus of this coordination and care

## 2019-07-07 ENCOUNTER — Other Ambulatory Visit: Payer: Self-pay

## 2019-07-07 ENCOUNTER — Ambulatory Visit: Payer: BC Managed Care – PPO | Attending: Internal Medicine

## 2019-07-07 DIAGNOSIS — Z23 Encounter for immunization: Secondary | ICD-10-CM | POA: Insufficient documentation

## 2019-07-07 NOTE — Progress Notes (Signed)
   Covid-19 Vaccination Clinic  Name:  Aimee Dixon    MRN: [id number] DOB: Jul 15, 1950  07/07/2019  Aimee Dixon was observed post Covid-19 immunization for 15 minutes without incidence. She was provided with Vaccine Information Sheet and instruction to access the V-Safe system.   Aimee Dixon was instructed to call 911 with any severe reactions post vaccine: Marland Kitchen Difficulty breathing  . Swelling of your face and throat  . A fast heartbeat  . A bad rash all over your body  . Dizziness and weakness    Immunizations Administered    Name Date Dose VIS Date Route   Moderna COVID-19 Vaccine 07/07/2019  3:15 PM 0.5 mL 04/30/2019 Intramuscular   Manufacturer: Moderna   Lot: [id number]   [place][id number]

## 2019-08-07 ENCOUNTER — Ambulatory Visit: Payer: BC Managed Care – PPO | Attending: Internal Medicine

## 2019-08-07 ENCOUNTER — Other Ambulatory Visit: Payer: Self-pay

## 2019-08-07 DIAGNOSIS — Z23 Encounter for immunization: Secondary | ICD-10-CM

## 2019-08-07 NOTE — Progress Notes (Signed)
   Covid-19 Vaccination Clinic  Name:  Aimee Dixon    MRN: [id number] DOB: 09/08/1950  08/07/2019  [name] was observed post Covid-19 immunization for 15 minutes without incident. She was provided with Vaccine Information Sheet and instruction to access the V-Safe system.   [name] was instructed to call 911 with any severe reactions post vaccine: Marland Kitchen Difficulty breathing  . Swelling of face and throat  . A fast heartbeat  . A bad rash all over body  . Dizziness and weakness   Immunizations Administered    Name Date Dose VIS Date Route   Moderna COVID-19 Vaccine 08/07/2019  1:40 PM 0.5 mL 04/30/2019 Intramuscular   Manufacturer: Moderna   Lot: [id number]   [id number]

## 2019-08-07 NOTE — Progress Notes (Signed)
   Covid-19 Vaccination Clinic  Name:  Aimee Dixon    MRN: [id number] DOB: 10-03-1950  08/07/2019  [name] was observed post Covid-19 immunization for 15 minutes without incident. She was provided with Vaccine Information Sheet and instruction to access the V-Safe system.   [name] was instructed to call 911 with any severe reactions post vaccine: Marland Kitchen Difficulty breathing  . Swelling of face and throat  . A fast heartbeat  . A bad rash all over body  . Dizziness and weakness   Immunizations Administered    Name Date Dose VIS Date Route   Moderna COVID-19 Vaccine 08/07/2019  1:40 PM 0.5 mL 04/30/2019 Intramuscular   Manufacturer: Moderna   Lot: [id number]   [place]VO:[id number]

## 2019-10-17 ENCOUNTER — Ambulatory Visit: Payer: BC Managed Care – PPO | Admitting: Nurse Practitioner

## 2019-11-21 ENCOUNTER — Other Ambulatory Visit (HOSPITAL_COMMUNITY): Payer: Self-pay | Admitting: Family Medicine

## 2019-11-21 DIAGNOSIS — Z1231 Encounter for screening mammogram for malignant neoplasm of breast: Secondary | ICD-10-CM

## 2019-11-22 ENCOUNTER — Other Ambulatory Visit: Payer: Self-pay

## 2019-11-22 ENCOUNTER — Ambulatory Visit: Payer: Medicare Other | Admitting: Gastroenterology

## 2019-11-22 ENCOUNTER — Encounter: Payer: Self-pay | Admitting: Gastroenterology

## 2019-11-22 VITALS — BP 180/86 | HR 80 | Temp 96.6°F | Ht 62.0 in | Wt 210.4 lb

## 2019-11-22 DIAGNOSIS — R194 Change in bowel habit: Secondary | ICD-10-CM | POA: Diagnosis not present

## 2019-11-22 DIAGNOSIS — R7303 Prediabetes: Secondary | ICD-10-CM

## 2019-11-22 DIAGNOSIS — E782 Mixed hyperlipidemia: Secondary | ICD-10-CM

## 2019-11-22 DIAGNOSIS — K219 Gastro-esophageal reflux disease without esophagitis: Secondary | ICD-10-CM | POA: Insufficient documentation

## 2019-11-22 MED ORDER — DICYCLOMINE HCL 10 MG PO CAPS: 10.0000 mg | ORAL_CAPSULE | Freq: Three times a day (TID) | ORAL | 3 refills | Status: DC

## 2019-11-22 MED ORDER — PANTOPRAZOLE SODIUM 40 MG PO TBEC
40.0000 mg | DELAYED_RELEASE_TABLET | Freq: Every day | ORAL | 3 refills | Status: DC
Start: 2019-11-22 — End: 2020-03-19

## 2019-11-22 NOTE — Progress Notes (Addendum)
Referring Provider: Kathyrn Drown, MD Primary Care Physician:  Kathyrn Drown, MD Primary GI: Dr. Gala Romney   Chief Complaint  Patient presents with  . Diarrhea    x1 year; occurs 5-10 minutes after eating  . burping  . Gas    HPI:   Aimee Dixon is a 69 y.o. female presenting today with a history of adenomas, with next colonoscopy in 2022, presenting due to diarrhea for close to a year.  Lots of burping, gas. +GERD. Takes Pepcid occasionally. Will have postprandial frequent stools, usually once or twice per day. Soft and watery. No rectal bleeding. Some abdominal cramping just prior. In upper abdomen feels sore. Feels improved after BM. No weight loss or lack of appetite. Sometimes has formed BM. Has to be at home with eating. Believes this started last year acutely, then just continued. At first she thought she had a virus but then it persisted.   Baseline bowel habits prior to this, would have a BM about daily. No new medications. No antibiotic exposures.  Stool is yellow if a "blowout". Otherwise brown.       Past Medical History:  Diagnosis Date  . Hyperlipidemia   . Hypertension   . IDA (iron deficiency anemia)   . Tubular adenoma of colon     Past Surgical History:  Procedure Laterality Date  . ABDOMINAL HYSTERECTOMY    . COLONOSCOPY  06/30/2003   PJK:DTOI papilla/Otherwise,normal rectum/Pedunculated polyp at middescending colon/Smaller pedunculated polyp at hepatic flexure, cold snared  . COLONOSCOPY   08/07/2006   ZTI:[id number] internal hemorrhoids, otherwise normal rectum/Diminutive ascending colon adenomatous polyps  . COLONOSCOPY  08/03/2009   RMR: tubular adenomas removed from hepatic flexure & desc colon, internal hemorrhoids and anal papilla otherwise norma;  . COLONOSCOPY N/A 10/03/2012   Procedure: COLONOSCOPY;  Surgeon: Daneil Dolin, MD;  Location: [facility];  Service: Endoscopy;  Laterality: N/A;  2:15  . COLONOSCOPY N/A 01/17/2018   Three 4-7 mm  polyps in descending colon and at hepatic flexure, s/p removal. Tubular adenomas. Surveillance in 2022.   Marland Kitchen POLYPECTOMY  01/17/2018   Procedure: POLYPECTOMY;  Surgeon: Daneil Dolin, MD;  Location: [facility];  Service: Endoscopy;;  hepatic flexure x2;descending colon    Current Outpatient Medications  Medication Sig Dispense Refill  . acetaminophen (TYLENOL) 500 MG tablet Take 500 mg by mouth every 6 (six) hours as needed.    Marland Kitchen albuterol (VENTOLIN HFA) 108 (90 Base) MCG/ACT inhaler Inhale 2 puffs into the lungs every 4 (four) hours as needed for wheezing. 18 g 3  . atorvastatin (LIPITOR) 20 MG tablet TAKE 1 TABLET(20 MG) BY MOUTH DAILY 90 tablet 1  . azelastine (ASTELIN) 0.1 % nasal spray Place 2 sprays into both nostrils 2 (two) times daily. 30 mL 12  . fexofenadine (ALLEGRA) 180 MG tablet Take 180 mg by mouth daily.    Marland Kitchen guaiFENesin (MUCINEX) 600 MG 12 hr tablet Take 600 mg by mouth daily.     Marland Kitchen lisinopril-hydrochlorothiazide (ZESTORETIC) 20-25 MG tablet Take 1 tablet by mouth daily. 90 tablet 1  . sertraline (ZOLOFT) 100 MG tablet Take one tablet daily 90 tablet 1  . dicyclomine (BENTYL) 10 MG capsule Take 1 capsule (10 mg total) by mouth 4 (four) times daily -  before meals and at bedtime. 120 capsule 3  . pantoprazole (PROTONIX) 40 MG tablet Take 1 tablet (40 mg total) by mouth daily. 30 minutes 30 tablet 3   No current facility-administered  medications for this visit.    Allergies as of 11/22/2019 - Review Complete 11/22/2019  Allergen Reaction Noted  . Norvasc [amlodipine besylate] Other (See Comments) 10/05/2012    Family History  Problem Relation Age of Onset  . Lung cancer Father   . Breast cancer Mother 12  . Colon cancer Neg Hx     Social History   Socioeconomic History  . Marital status: Married    Spouse name: Not on file  . Number of children: 1  . Years of education: Not on file  . Highest education level: Not on file  Occupational History  . Occupation:  Medical laboratory scientific officer: [employer]  Tobacco Use  . Smoking status: Never Smoker  . Smokeless tobacco: Never Used  Vaping Use  . Vaping Use: Never used  Substance and Sexual Activity  . Alcohol use: No  . Drug use: No  . Sexual activity: Yes    Birth control/protection: Surgical  Other Topics Concern  . Not on file  Social History Narrative   Lives w/ husband   Social Determinants of Health   Financial Resource Strain:   . Difficulty of Paying Living Expenses:   Food Insecurity:   . Worried About Charity fundraiser in the Last Year:   . Arboriculturist in the Last Year:   Transportation Needs:   . Film/video editor (Medical):   Marland Kitchen Lack of Transportation (Non-Medical):   Physical Activity:   . Days of Exercise per Week:   . Minutes of Exercise per Session:   Stress:   . Feeling of Stress :   Social Connections:   . Frequency of Communication with Friends and Family:   . Frequency of Social Gatherings with Friends and Family:   . Attends Religious Services:   . Active Member of Clubs or Organizations:   . Attends Archivist Meetings:   Marland Kitchen Marital Status:     Review of Systems: Gen: Denies fever, chills, anorexia. Denies fatigue, weakness, weight loss.  CV: Denies chest pain, palpitations, syncope, peripheral edema, and claudication. Resp: Denies dyspnea at rest, cough, wheezing, coughing up blood, and pleurisy. GI: see HPI Derm: Denies rash, itching, dry skin Psych: Denies depression, anxiety, memory loss, confusion. No homicidal or suicidal ideation.  Heme: Denies bruising, bleeding, and enlarged lymph nodes.  Physical Exam: BP (!) 180/86   Pulse 80   Temp (!) 96.6 F (35.9 C) (Temporal)   Ht 5\' 2"  (1.575 m)   Wt 210 lb 6.4 oz (95.4 kg)   BMI 38.48 kg/m  General:   Alert and oriented. No distress noted. Pleasant and cooperative.  Head:  Normocephalic and atraumatic. Eyes:  Conjuctiva clear without scleral icterus. Mouth:  Mask in  place Lungs: clear bilaterally Cardiac: S1 S2 present with  systolic murmur Abdomen:  +BS, soft, mild tenderness upper abdomen and non-distended. No rebound or guarding. No HSM or masses noted. Msk:  Symmetrical without gross deformities. Normal posture. Extremities:  Without edema. Neurologic:  Alert and  oriented x4 Psych:  Alert and cooperative. Normal mood and affect.  ASSESSMENT: TEJAH BREKKE is a very pleasant 69 y.o. female presenting today with onset of more frequent stools, abdominal cramping approximately 1 year ago, changed from baseline without alarm signs/symptoms, denying rectal bleeding, weight loss. Intermittent GERD noted without PPI, and she notes burping frequently since this time.   Change in bowel habits: could have had self-limiting illness and now with post-infectious symptoms.  Although stool ranges between soft/watery, she will at times have a formed BM. No exposure to antibiotics or sick contacts, no changes in meds. Associated abdominal cramping relieved with bowel movements. Will treat supportively as IBS but will need further more advanced evaluation if no improvement in next few weeks. As this has been going on for about a year, I'm holding off on stool studies. Routine labs ordered today; I also placed orders that PCP had requested that patient as unable to do recently and will forward this to them when available.   Belching, history of GERD: without PPI. Typical reflux symptoms not daily but burping is frequent. Gallbladder present. Does not seem to have underlying biliary etiology and moreso belching likely due to atypical GERD symptoms. No N/V. Will trial PPI once daily. No alarm signs/symptoms.   PLAN:   Start Bentyl, taking before meals and at bedtime. Side effects discussed. Call if no improvement.  Recommend colonoscopy if no improvement; she is actually due in 2022 regardless  Start Protonix once daily. Call if no improvement  Labs ordered  today  Will see in 3 months or sooner if needed   Annitta Needs, PhD, ANP-BC [facility]

## 2019-11-22 NOTE — Patient Instructions (Addendum)
I have ordered blood work to be done when you are able.  I have sent in Protonix to take once each morning, 30 minutes before breakfast. This is for reflux, and you should likely note improvement with burping/belching on this. It may take up to 2 weeks to take full effect.  For the frequent stool, I have sent in dicyclomine (Bentyl). Take this as needed before meals and at bedtime, up to 4 times per day. Side effects to monitor for include constipation, dry mouth, dizziness, confusion. Please let me know if this doesn't help you with your symptoms, as we will need to do further evaluation.  I would like to see you in 3 months regardless.   Further recommendations to follow!  I enjoyed seeing you again today! As you know, I value our relationship and want to provide genuine, compassionate, and quality care. I welcome your feedback. If you receive a survey regarding your visit,  I greatly appreciate you taking time to fill this out. See you next time!  Annitta Needs, PhD, ANP-BC [facility]   Food Choices for Gastroesophageal Reflux Disease, Adult When you have gastroesophageal reflux disease (GERD), the foods you eat and your eating habits are very important. Choosing the right foods can help ease the discomfort of GERD. Consider working with a diet and nutrition specialist (dietitian) to help you make healthy food choices. What general guidelines should I follow?  Eating plan  Choose healthy foods low in fat, such as fruits, vegetables, whole grains, low-fat dairy products, and lean meat, fish, and poultry.  Eat frequent, small meals instead of three large meals each day. Eat your meals slowly, in a relaxed setting. Avoid bending over or lying down until 2-3 hours after eating.  Limit high-fat foods such as fatty meats or fried foods.  Limit your intake of oils, butter, and shortening to less than 8 teaspoons each day.  Avoid the following: ? Foods that cause symptoms.  These may be different for different people. Keep a food diary to keep track of foods that cause symptoms. ? Alcohol. ? Drinking large amounts of liquid with meals. ? Eating meals during the 2-3 hours before bed.  Cook foods using methods other than frying. This may include baking, grilling, or broiling. Lifestyle  Maintain a healthy weight. Ask your health care provider what weight is healthy for you. If you need to lose weight, work with your health care provider to do so safely.  Exercise for at least 30 minutes on 5 or more days each week, or as told by your health care provider.  Avoid wearing clothes that fit tightly around your waist and chest.  Do not use any products that contain nicotine or tobacco, such as cigarettes and e-cigarettes. If you need help quitting, ask your health care provider.  Sleep with the head of your bed raised. Use a wedge under the mattress or blocks under the bed frame to raise the head of the bed. What foods are not recommended? The items listed may not be a complete list. Talk with your dietitian about what dietary choices are best for you. Grains Pastries or quick breads with added fat. [place] toast. Vegetables Deep fried vegetables. [place] fries. Any vegetables prepared with added fat. Any vegetables that cause symptoms. For some people this may include tomatoes and tomato products, chili peppers, onions and garlic, and horseradish. Fruits Any fruits prepared with added fat. Any fruits that cause symptoms. For some people this may include citrus  fruits, such as oranges, grapefruit, pineapple, and lemons. Meats and other protein foods High-fat meats, such as fatty beef or pork, hot dogs, ribs, ham, sausage, salami and bacon. Fried meat or protein, including fried fish and fried chicken. Nuts and nut butters. Dairy Whole milk and chocolate milk. Sour cream. Cream. Ice cream. Cream cheese. Milk shakes. Beverages Coffee and tea, with or without  caffeine. Carbonated beverages. Sodas. Energy drinks. Fruit juice made with acidic fruits (such as orange or grapefruit). Tomato juice. Alcoholic drinks. Fats and oils Butter. Margarine. Shortening. Ghee. Sweets and desserts Chocolate and cocoa. Donuts. Seasoning and other foods Pepper. Peppermint and spearmint. Any condiments, herbs, or seasonings that cause symptoms. For some people, this may include curry, hot sauce, or vinegar-based salad dressings. Summary  When you have gastroesophageal reflux disease (GERD), food and lifestyle choices are very important to help ease the discomfort of GERD.  Eat frequent, small meals instead of three large meals each day. Eat your meals slowly, in a relaxed setting. Avoid bending over or lying down until 2-3 hours after eating.  Limit high-fat foods such as fatty meat or fried foods. This information is not intended to replace advice given to you by your health care provider. Make sure you discuss any questions you have with your health care provider. Document Revised: 09/06/2018 Document Reviewed: 05/17/2016 Elsevier Patient Education  [place].

## 2019-11-23 ENCOUNTER — Other Ambulatory Visit: Payer: Self-pay | Admitting: Gastroenterology

## 2019-11-23 LAB — COMPLETE METABOLIC PANEL WITH GFR
AG Ratio: 1.5 (calc) (ref 1.0–2.5)
ALT: 12 U/L (ref 6–29)
AST: 16 U/L (ref 10–35)
Albumin: 4.3 g/dL (ref 3.6–5.1)
Alkaline phosphatase (APISO): 92 U/L (ref 37–153)
BUN: 10 mg/dL (ref 7–25)
CO2: 35 mmol/L — ABNORMAL HIGH (ref 20–32)
Calcium: 10.1 mg/dL (ref 8.6–10.4)
Chloride: 98 mmol/L (ref 98–110)
Creat: 0.62 mg/dL (ref 0.50–0.99)
GFR, Est African American: 109 mL/min/{1.73_m2} (ref >=60)
GFR, Est Non African American: 94 mL/min/{1.73_m2} (ref >=60)
Globulin: 2.8 g/dL (calc) (ref 1.9–3.7)
Glucose, Bld: 118 mg/dL — ABNORMAL HIGH (ref 65–99)
Potassium: 2.9 mmol/L — ABNORMAL LOW (ref 3.5–5.3)
Sodium: 142 mmol/L (ref 135–146)
Total Bilirubin: 0.5 mg/dL (ref 0.2–1.2)
Total Protein: 7.1 g/dL (ref 6.1–8.1)

## 2019-11-23 LAB — CBC WITH DIFFERENTIAL/PLATELET
Absolute Monocytes: 481 cells/uL (ref 200–950)
Basophils Absolute: 22 cells/uL (ref 0–200)
Basophils Relative: 0.3 %
Eosinophils Absolute: 59 cells/uL (ref 15–500)
Eosinophils Relative: 0.8 %
HCT: 36.6 % (ref 35.0–45.0)
Hemoglobin: 12.3 g/dL (ref 11.7–15.5)
Lymphs Abs: 1946 cells/uL (ref 850–3900)
MCH: 29.6 pg (ref 27.0–33.0)
MCHC: 33.6 g/dL (ref 32.0–36.0)
MCV: 88.2 fL (ref 80.0–100.0)
MPV: 11.6 fL (ref 7.5–12.5)
Monocytes Relative: 6.5 %
Neutro Abs: 4891 cells/uL (ref 1500–7800)
Neutrophils Relative %: 66.1 %
Platelets: 254 10*3/uL (ref 140–400)
RBC: 4.15 10*6/uL (ref 3.80–5.10)
RDW: 12.3 % (ref 11.0–15.0)
Total Lymphocyte: 26.3 %
WBC: 7.4 10*3/uL (ref 3.8–10.8)

## 2019-11-23 LAB — LIPID PANEL
Cholesterol: 178 mg/dL (ref <200)
HDL: 48 mg/dL — ABNORMAL LOW (ref >=50)
LDL Cholesterol (Calc): 108 mg/dL (calc) — ABNORMAL HIGH
Non-HDL Cholesterol (Calc): 130 mg/dL (calc) — ABNORMAL HIGH (ref <130)
Total CHOL/HDL Ratio: 3.7 (calc) (ref <5.0)
Triglycerides: 116 mg/dL (ref <150)

## 2019-11-23 LAB — HEMOGLOBIN A1C
Hgb A1c MFr Bld: 6.3 % of total Hgb — ABNORMAL HIGH (ref <5.7)
Mean Plasma Glucose: 134 (calc)
eAG (mmol/L): 7.4 (calc)

## 2019-11-23 MED ORDER — POTASSIUM CHLORIDE ER 10 MEQ PO CPCR: 20.0000 meq | ORAL_CAPSULE | Freq: Two times a day (BID) | ORAL | 0 refills | Status: DC

## 2019-11-24 ENCOUNTER — Other Ambulatory Visit: Payer: Self-pay | Admitting: Gastroenterology

## 2019-11-25 ENCOUNTER — Other Ambulatory Visit: Payer: Self-pay | Admitting: Emergency Medicine

## 2019-11-25 DIAGNOSIS — R194 Change in bowel habit: Secondary | ICD-10-CM

## 2019-11-25 DIAGNOSIS — E782 Mixed hyperlipidemia: Secondary | ICD-10-CM

## 2019-11-25 NOTE — Progress Notes (Signed)
bmp 

## 2019-11-25 NOTE — Progress Notes (Signed)
Cc'ed to pcp °

## 2019-11-27 DIAGNOSIS — R194 Change in bowel habit: Secondary | ICD-10-CM | POA: Diagnosis not present

## 2019-11-28 ENCOUNTER — Other Ambulatory Visit: Payer: Self-pay | Admitting: Gastroenterology

## 2019-11-28 ENCOUNTER — Other Ambulatory Visit: Payer: Self-pay

## 2019-11-28 ENCOUNTER — Ambulatory Visit (HOSPITAL_COMMUNITY): Payer: Medicare Other

## 2019-11-28 DIAGNOSIS — E876 Hypokalemia: Secondary | ICD-10-CM

## 2019-11-28 LAB — BASIC METABOLIC PANEL
BUN: 12 mg/dL (ref 7–25)
CO2: 34 mmol/L — ABNORMAL HIGH (ref 20–32)
Calcium: 10 mg/dL (ref 8.6–10.4)
Chloride: 97 mmol/L — ABNORMAL LOW (ref 98–110)
Creat: 0.68 mg/dL (ref 0.50–0.99)
Glucose, Bld: 96 mg/dL (ref 65–139)
Potassium: 3 mmol/L — ABNORMAL LOW (ref 3.5–5.3)
Sodium: 141 mmol/L (ref 135–146)

## 2019-11-28 MED ORDER — POTASSIUM CHLORIDE ER 10 MEQ PO CPCR: 40.0000 meq | ORAL_CAPSULE | Freq: Two times a day (BID) | ORAL | 0 refills | Status: DC

## 2019-11-29 ENCOUNTER — Other Ambulatory Visit: Payer: Self-pay | Admitting: Family Medicine

## 2019-11-29 ENCOUNTER — Telehealth: Payer: Self-pay | Admitting: Family Medicine

## 2019-11-29 NOTE — Telephone Encounter (Signed)
Please advise. Thank you

## 2019-11-29 NOTE — Telephone Encounter (Signed)
Pt went to see Melanee Left at Dr Buford Dresser office and blood was done and potassium come back low. Pt is on lisinopril-hydrochlorothiazide (ZESTORETIC) 20-25 MG    Melanee Left wanted patient to let Dr Nicki Reaper know and that she had put her on potassium chloride (MICRO-K) 10 MEQ CR capsule

## 2019-11-29 NOTE — Telephone Encounter (Signed)
Please let patient know that I have been made aware of her low potassium I would like for her to do a follow-up visit next week Bring medications with visit

## 2019-11-29 NOTE — Telephone Encounter (Signed)
Patient notified of results and scheduled appointment with Dr. Nicki Reaper

## 2019-12-04 ENCOUNTER — Other Ambulatory Visit: Payer: Self-pay

## 2019-12-04 ENCOUNTER — Encounter: Payer: Self-pay | Admitting: Family Medicine

## 2019-12-04 ENCOUNTER — Ambulatory Visit (INDEPENDENT_AMBULATORY_CARE_PROVIDER_SITE_OTHER): Payer: Medicare Other | Admitting: Family Medicine

## 2019-12-04 VITALS — BP 128/80 | Temp 98.0°F | Ht 62.0 in | Wt 209.0 lb

## 2019-12-04 DIAGNOSIS — E876 Hypokalemia: Secondary | ICD-10-CM

## 2019-12-04 MED ORDER — LISINOPRIL 20 MG PO TABS: 20.0000 mg | ORAL_TABLET | Freq: Every day | ORAL | 1 refills | Status: DC

## 2019-12-04 NOTE — Progress Notes (Signed)
   Subjective:    Patient ID: Aimee Dixon, female    DOB: [birth year], 69 y.o.   MRN: [id number]  HPIpt following on low potassium. Pt states she will do bw again this Friday.  Patient had low potassium with follow-up on gastroenterology.  Because of this I put her on potassium. Repeat potassium later this week She is on blood pressure medicine that could be causing potassium to go low.  Denies any other particular troubles.  No fever chills vomiting etc.   Review of Systems See above    Objective:   Physical Exam  Lungs clear heart regular HEENT benign  No edema in the legs    Assessment & Plan:  Patient will do a follow-up on her chronic medications by this fall Low potassium does follow-up lab work later this week Switch blood pressure medicines to avoid HCTZ Follow-up in several weeks at that point time recommend possibly more lab work.  Recheck blood pressure at that time

## 2019-12-06 ENCOUNTER — Other Ambulatory Visit: Payer: Self-pay | Admitting: Gastroenterology

## 2019-12-06 DIAGNOSIS — E876 Hypokalemia: Secondary | ICD-10-CM | POA: Diagnosis not present

## 2019-12-07 LAB — BASIC METABOLIC PANEL
BUN: 11 mg/dL (ref 7–25)
CO2: 27 mmol/L (ref 20–32)
Calcium: 10.2 mg/dL (ref 8.6–10.4)
Chloride: 105 mmol/L (ref 98–110)
Creat: 0.7 mg/dL (ref 0.50–0.99)
Glucose, Bld: 113 mg/dL (ref 65–139)
Potassium: 4.4 mmol/L (ref 3.5–5.3)
Sodium: 140 mmol/L (ref 135–146)

## 2019-12-09 ENCOUNTER — Ambulatory Visit (HOSPITAL_COMMUNITY)
Admission: RE | Admit: 2019-12-09 | Discharge: 2019-12-09 | Disposition: A | Discharge status: Home / Self Care | Payer: Medicare Other | Source: Ambulatory Visit | Attending: Family Medicine | Admitting: Family Medicine

## 2019-12-09 ENCOUNTER — Other Ambulatory Visit: Payer: Self-pay

## 2019-12-09 DIAGNOSIS — Z1231 Encounter for screening mammogram for malignant neoplasm of breast: Secondary | ICD-10-CM | POA: Insufficient documentation

## 2019-12-18 ENCOUNTER — Other Ambulatory Visit: Payer: Self-pay

## 2019-12-18 ENCOUNTER — Encounter: Payer: Self-pay | Admitting: Family Medicine

## 2019-12-18 ENCOUNTER — Ambulatory Visit (INDEPENDENT_AMBULATORY_CARE_PROVIDER_SITE_OTHER): Payer: Medicare Other | Admitting: Family Medicine

## 2019-12-18 VITALS — BP 140/90 | Temp 97.6°F | Wt 218.4 lb

## 2019-12-18 DIAGNOSIS — Z23 Encounter for immunization: Secondary | ICD-10-CM | POA: Diagnosis not present

## 2019-12-18 DIAGNOSIS — I10 Essential (primary) hypertension: Secondary | ICD-10-CM

## 2019-12-18 NOTE — Progress Notes (Signed)
   Subjective:    Patient ID: Aimee Dixon, female    DOB: 12/23/1950, 69 y.o.   MRN: [id number]  HPI Pt here for follow up on potassium. Pt was seen on 12/04/19 for hypokalemia. Pt informed to stop Potassium 10 mEq. Pt is taking Lisinopril 20 mg daily.  I reviewed her previous lab I also reviewed over recent office visit  Review of Systems  Constitutional: Negative for activity change, fatigue and fever.  HENT: Negative for congestion and rhinorrhea.   Respiratory: Negative for cough, chest tightness and shortness of breath.   Cardiovascular: Negative for chest pain and leg swelling.  Gastrointestinal: Negative for abdominal pain and nausea.  Skin: Negative for color change.  Neurological: Negative for dizziness and headaches.  Psychiatric/Behavioral: Negative for agitation and behavioral problems.       Objective:   Physical Exam Vitals reviewed.  Constitutional:      General: She is not in acute distress. HENT:     Head: Normocephalic and atraumatic.  Eyes:     General:        Right eye: No discharge.        Left eye: No discharge.  Neck:     Trachea: No tracheal deviation.  Cardiovascular:     Rate and Rhythm: Normal rate and regular rhythm.     Heart sounds: Normal heart sounds. No murmur heard.   Pulmonary:     Effort: Pulmonary effort is normal. No respiratory distress.     Breath sounds: Normal breath sounds.  Lymphadenopathy:     Cervical: No cervical adenopathy.  Skin:    General: Skin is warm and dry.  Neurological:     Mental Status: She is alert.     Coordination: Coordination normal.  Psychiatric:        Behavior: Behavior normal.           Assessment & Plan:  Patient will do met 7 again in a few weeks time Blood pressure borderline patient was encouraged to exercise watch diet try to lose weight follow-up in several months may need more medication if she cannot get this down through natural means continue current medication 

## 2019-12-18 NOTE — Patient Instructions (Signed)
Healthy Eating Following a healthy eating pattern may help you to achieve and maintain a healthy body weight, reduce the risk of chronic disease, and live a long and productive life. It is important to follow a healthy eating pattern at an appropriate calorie level for your body. Your nutritional needs should be met primarily through food by choosing a variety of nutrient-rich foods. What are tips for following this plan? Reading food labels  Read labels and choose the following: ? Reduced or low sodium. ? Juices with 100% fruit juice. ? Foods with low saturated fats and high polyunsaturated and monounsaturated fats. ? Foods with whole grains, such as whole wheat, cracked wheat, brown rice, and wild rice. ? Whole grains that are fortified with folic acid. This is recommended for women who are pregnant or who want to become pregnant.  Read labels and avoid the following: ? Foods with a lot of added sugars. These include foods that contain brown sugar, corn sweetener, corn syrup, dextrose, fructose, glucose, high-fructose corn syrup, honey, invert sugar, lactose, malt syrup, maltose, molasses, raw sugar, sucrose, trehalose, or turbinado sugar.  Do not eat more than the following amounts of added sugar per day:  6 teaspoons (25 g) for women.  9 teaspoons (38 g) for men. ? Foods that contain processed or refined starches and grains. ? Refined grain products, such as white flour, degermed cornmeal, white bread, and white rice. Shopping  Choose nutrient-rich snacks, such as vegetables, whole fruits, and nuts. Avoid high-calorie and high-sugar snacks, such as potato chips, fruit snacks, and candy.  Use oil-based dressings and spreads on foods instead of solid fats such as butter, stick margarine, or cream cheese.  Limit pre-made sauces, mixes, and "instant" products such as flavored rice, instant noodles, and ready-made pasta.  Try more plant-protein sources, such as tofu, tempeh, black beans,  edamame, lentils, nuts, and seeds.  Explore eating plans such as the Mediterranean diet or vegetarian diet. Cooking  Use oil to saut or stir-fry foods instead of solid fats such as butter, stick margarine, or lard.  Try baking, boiling, grilling, or broiling instead of frying.  Remove the fatty part of meats before cooking.  Steam vegetables in water or broth. Meal planning   At meals, imagine dividing your plate into fourths: ? One-half of your plate is fruits and vegetables. ? One-fourth of your plate is whole grains. ? One-fourth of your plate is protein, especially lean meats, poultry, eggs, tofu, beans, or nuts.  Include low-fat dairy as part of your daily diet. Lifestyle  Choose healthy options in all settings, including home, work, school, restaurants, or stores.  Prepare your food safely: ? Wash your hands after handling raw meats. ? Keep food preparation surfaces clean by regularly washing with hot, soapy water. ? Keep raw meats separate from ready-to-eat foods, such as fruits and vegetables. ? Cook seafood, meat, poultry, and eggs to the recommended internal temperature. ? Store foods at safe temperatures. In general:  Keep cold foods at 59F (4.4C) or below.  Keep hot foods at 159F (60C) or above.  Keep your freezer at [facility] (-17.8C) or below.  Foods are no longer safe to eat when they have been between the temperatures of 40-159F (4.4-60C) for more than 2 hours. What foods should I eat? Fruits Aim to eat 2 cup-equivalents of fresh, canned (in natural juice), or frozen fruits each day. Examples of 1 cup-equivalent of fruit include 1 small apple, 8 large strawberries, 1 cup canned fruit,  cup  dried fruit, or 1 cup 100% juice. Vegetables Aim to eat 2-3 cup-equivalents of fresh and frozen vegetables each day, including different varieties and colors. Examples of 1 cup-equivalent of vegetables include 2 medium carrots, 2 cups raw, leafy greens, 1 cup chopped  vegetable (raw or cooked), or 1 medium baked potato. Grains Aim to eat 6 ounce-equivalents of whole grains each day. Examples of 1 ounce-equivalent of grains include 1 slice of bread, 1 cup ready-to-eat cereal, 3 cups popcorn, or  cup cooked rice, pasta, or cereal. Meats and other proteins Aim to eat 5-6 ounce-equivalents of protein each day. Examples of 1 ounce-equivalent of protein include 1 egg, 1/2 cup nuts or seeds, or 1 tablespoon (16 g) peanut butter. A cut of meat or fish that is the size of a deck of cards is about 3-4 ounce-equivalents.  Of the protein you eat each week, try to have at least 8 ounces come from seafood. This includes salmon, trout, herring, and anchovies. Dairy Aim to eat 3 cup-equivalents of fat-free or low-fat dairy each day. Examples of 1 cup-equivalent of dairy include 1 cup (240 mL) milk, 8 ounces (250 g) yogurt, 1 ounces (44 g) natural cheese, or 1 cup (240 mL) fortified soy milk. Fats and oils  Aim for about 5 teaspoons (21 g) per day. Choose monounsaturated fats, such as canola and olive oils, avocados, peanut butter, and most nuts, or polyunsaturated fats, such as sunflower, corn, and soybean oils, walnuts, pine nuts, sesame seeds, sunflower seeds, and flaxseed. Beverages  Aim for six 8-oz glasses of water per day. Limit coffee to three to five 8-oz cups per day.  Limit caffeinated beverages that have added calories, such as soda and energy drinks.  Limit alcohol intake to no more than 1 drink a day for nonpregnant women and 2 drinks a day for men. One drink equals 12 oz of beer (355 mL), 5 oz of wine (148 mL), or 1 oz of hard liquor (44 mL). Seasoning and other foods  Avoid adding excess amounts of salt to your foods. Try flavoring foods with herbs and spices instead of salt.  Avoid adding sugar to foods.  Try using oil-based dressings, sauces, and spreads instead of solid fats. This information is based on general [place] nutrition guidelines. For more  information, visit [employer]. Exact amounts may vary based on your nutrition needs. Summary  A healthy eating plan may help you to maintain a healthy weight, reduce the risk of chronic diseases, and stay active throughout your life.  Plan your meals. Make sure you eat the right portions of a variety of nutrient-rich foods.  Try baking, boiling, grilling, or broiling instead of frying.  Choose healthy options in all settings, including home, work, school, restaurants, or stores. This information is not intended to replace advice given to you by your health care provider. Make sure you discuss any questions you have with your health care provider. Document Revised: 08/28/2017 Document Reviewed: 08/28/2017 Elsevier Patient Education  [place].

## 2020-01-03 ENCOUNTER — Other Ambulatory Visit: Payer: Self-pay | Admitting: Family Medicine

## 2020-01-09 NOTE — Telephone Encounter (Signed)
May have 90-day on the Lipitor with 1 refill Please inform pharmacy that we stopped her lisinopril HCTZ and switch her to just plain lisinopril.  They should take this off their computer so we do not keep getting these

## 2020-02-22 ENCOUNTER — Other Ambulatory Visit: Payer: Self-pay | Admitting: Family Medicine

## 2020-02-25 ENCOUNTER — Other Ambulatory Visit: Payer: Self-pay

## 2020-02-25 ENCOUNTER — Ambulatory Visit: Payer: Medicare Other | Admitting: Gastroenterology

## 2020-02-25 ENCOUNTER — Telehealth: Payer: Self-pay | Admitting: *Deleted

## 2020-02-25 ENCOUNTER — Encounter: Payer: Self-pay | Admitting: Gastroenterology

## 2020-02-25 ENCOUNTER — Telehealth: Payer: Self-pay | Admitting: Internal Medicine

## 2020-02-25 ENCOUNTER — Encounter: Payer: Self-pay | Admitting: *Deleted

## 2020-02-25 ENCOUNTER — Encounter: Payer: Self-pay | Admitting: Internal Medicine

## 2020-02-25 VITALS — BP 182/92 | HR 93 | Temp 97.3°F | Ht 62.0 in | Wt 209.6 lb

## 2020-02-25 DIAGNOSIS — R194 Change in bowel habit: Secondary | ICD-10-CM | POA: Diagnosis not present

## 2020-02-25 DIAGNOSIS — K219 Gastro-esophageal reflux disease without esophagitis: Secondary | ICD-10-CM | POA: Diagnosis not present

## 2020-02-25 NOTE — Telephone Encounter (Signed)
Patient returned call to schedule procedure  [phone]

## 2020-02-25 NOTE — Telephone Encounter (Signed)
Patient returned call. She has been scheduled for 10/28 at 12pm. Aware will mail instructions with covid test appt. Confirmed mailing address.

## 2020-02-25 NOTE — Telephone Encounter (Signed)
LMOVM for pt to call back to schedule TCS with propofol, Dr. Gala Romney, ASA 2

## 2020-02-25 NOTE — H&P (View-Only) (Signed)
Referring Provider: Kathyrn Drown, MD Primary Care Physician:  Kathyrn Drown, MD Primary GI: Dr. Gala Romney   Chief Complaint  Patient presents with  . Diarrhea    still ongoig issue  . Gastroesophageal Reflux    has improved    HPI:   Aimee Dixon is a 69 y.o. female presenting today with a history of adenomas and colonoscopy due 2022, chronic diarrhea, GERD, for follow-up. 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   Started on Protonix for GERD at last visit in June 2021. Protonix has helped with GERD symptoms. No dysphagia. No N/V.   Bentyl 2-3 times per day. Loose stool about once per day. Not as much as she was before but still present. Feels improved but still concern. Postprandial urgency. No rectal bleeding. Sometimes abdominal discomfort/cramping. No weight loss.   Past Medical History:  Diagnosis Date  . Hyperlipidemia   . Hypertension   . IDA (iron deficiency anemia)   . Tubular adenoma of colon     Past Surgical History:  Procedure Laterality Date  . ABDOMINAL HYSTERECTOMY    . COLONOSCOPY  06/30/2003   BHA:LPFX papilla/Otherwise,normal rectum/Pedunculated polyp at middescending colon/Smaller pedunculated polyp at hepatic flexure, cold snared  . COLONOSCOPY   08/07/2006   TKW:[id number] internal hemorrhoids, otherwise normal rectum/Diminutive ascending colon adenomatous polyps  . COLONOSCOPY  08/03/2009   RMR: tubular adenomas removed from hepatic flexure & desc colon, internal hemorrhoids and anal papilla otherwise norma;  . COLONOSCOPY N/A 10/03/2012   Procedure: COLONOSCOPY;  Surgeon: Daneil Dolin, MD;  [facility];  Service: Endoscopy;  Laterality: N/A;  2:15  . COLONOSCOPY N/A 01/17/2018   Three 4-7 mm polyps in descending colon and at hepatic flexure, s/p removal. Tubular adenomas. Surveillance in 2022.   Marland Kitchen POLYPECTOMY  01/17/2018   Procedure: POLYPECTOMY;  Surgeon: Daneil Dolin,  MD;  [facility]: [facility];  Service: Endoscopy;;  hepatic flexure x2;descending colon    Current Outpatient Medications  Medication Sig Dispense Refill  . acetaminophen (TYLENOL) 500 MG tablet Take 500 mg by mouth every 6 (six) hours as needed.    Marland Kitchen albuterol (VENTOLIN HFA) 108 (90 Base) MCG/ACT inhaler Inhale 2 puffs into the lungs every 4 (four) hours as needed for wheezing. 18 g 3  . atorvastatin (LIPITOR) 20 MG tablet TAKE 1 TABLET(20 MG) BY MOUTH DAILY 90 tablet 1  . azelastine (ASTELIN) 0.1 % nasal spray Place 2 sprays into both nostrils 2 (two) times daily. 30 mL 12  . dicyclomine (BENTYL) 10 MG capsule Take 1 capsule (10 mg total) by mouth 4 (four) times daily -  before meals and at bedtime. (Patient taking differently: Take 10 mg by mouth 4 (four) times daily -  before meals and at bedtime. 3 times per day) 120 capsule 3  . fexofenadine (ALLEGRA) 180 MG tablet Take 180 mg by mouth daily.    Marland Kitchen guaiFENesin (MUCINEX) 600 MG 12 hr tablet Take 600 mg by mouth daily.     Marland Kitchen lisinopril (ZESTRIL) 20 MG tablet Take 1 tablet (20 mg total) by mouth daily. 90 tablet 1  . pantoprazole (PROTONIX) 40 MG tablet Take 1 tablet (40 mg total) by mouth daily. 30 minutes 30 tablet 3  . sertraline (ZOLOFT) 100 MG tablet TAKE 1 TABLET BY MOUTH DAILY 90 tablet 0   No current facility-administered medications for this visit.    Allergies as of 02/25/2020 -  Review Complete 02/25/2020  Allergen Reaction Noted  . Norvasc [amlodipine besylate] Other (See Comments) 10/05/2012    Family History  Problem Relation Age of Onset  . Lung cancer Father   . Breast cancer Mother 57  . Colon cancer Neg Hx     Social History   Socioeconomic History  . Marital status: Married    Spouse name: Not on file  . Number of children: 1  . Years of education: Not on file  . Highest education level: Not on file  Occupational History  . Occupation: Medical laboratory scientific officer: [employer]  Tobacco Use  .  Smoking status: Never Smoker  . Smokeless tobacco: Never Used  Vaping Use  . Vaping Use: Never used  Substance and Sexual Activity  . Alcohol use: No  . Drug use: No  . Sexual activity: Yes    Birth control/protection: Surgical  Other Topics Concern  . Not on file  Social History Narrative   Lives w/ husband   Social Determinants of Health   Financial Resource Strain:   . Difficulty of Paying Living Expenses: Not on file  Food Insecurity:   . Worried About Charity fundraiser in the Last Year: Not on file  . Ran Out of Food in the Last Year: Not on file  Transportation Needs:   . Lack of Transportation (Medical): Not on file  . Lack of Transportation (Non-Medical): Not on file  Physical Activity:   . Days of Exercise per Week: Not on file  . Minutes of Exercise per Session: Not on file  Stress:   . Feeling of Stress : Not on file  Social Connections:   . Frequency of Communication with Friends and Family: Not on file  . Frequency of Social Gatherings with Friends and Family: Not on file  . Attends Religious Services: Not on file  . Active Member of Clubs or Organizations: Not on file  . Attends Archivist Meetings: Not on file  . Marital Status: Not on file    Review of Systems: Gen: Denies fever, chills, anorexia. Denies fatigue, weakness, weight loss.  CV: Denies chest pain, palpitations, syncope, peripheral edema, and claudication. Resp: Denies dyspnea at rest, cough, wheezing, coughing up blood, and pleurisy. GI: see HPI Derm: Denies rash, itching, dry skin Psych: Denies depression, anxiety, memory loss, confusion. No homicidal or suicidal ideation.  Heme: Denies bruising, bleeding, and enlarged lymph nodes.  Physical Exam: BP (!) 182/92   Pulse 93   Temp (!) 97.3 F (36.3 C)   Ht 5\' 2"  (1.575 m)   Wt 209 lb 9.6 oz (95.1 kg)   BMI 38.34 kg/m  General:   Alert and oriented. No distress noted. Pleasant and cooperative.  Head:  Normocephalic and  atraumatic. Eyes:  Conjuctiva clear without scleral icterus. Mouth:  Mask in place Cardiac: S1 S2 present with systolic murmur Lungs: clear bilaterally Abdomen:  +BS, soft, non-tender and non-distended. No rebound or guarding. No HSM or masses noted. Msk:  Symmetrical without gross deformities. Normal posture. Extremities:  Without edema. Neurologic:  Alert and  oriented x4 Psych:  Alert and cooperative. Normal mood and affect.  ASSESSMENT: VINA BYRD is a 69 y.o. female presenting today with history of adenomas with colonoscopy already slated for 2022, now with more frequent stool for the past year and urgent postprandial symptoms. Initially, she was treated as more post-infectious IBS presentation and did show some improvement with Bentyl although still persistent. No  overt GI bleeding. No alarm signs/symptoms. We discussed pursuing colonoscopy now with biopsy. I suspect this is still more of a post-infectious IBS presentation.   GERD well-managed on Protonix once daily.    PLAN:  Proceed with colonoscopy by Dr. Gala Romney in near future using Propofol: the risks, benefits, and alternatives have been discussed with the patient in detail. The patient states understanding and desires to proceed.   Continue Bentyl prn  Protonix once daily  Return in 4 months  Annitta Needs, PhD, [facility]

## 2020-02-25 NOTE — Progress Notes (Signed)
CC'ED TO PCP 

## 2020-02-25 NOTE — Patient Instructions (Signed)
Continue Protonix once daily.   You can continue Bentyl up to four times a day. Monitor for constipation, dry mouth, dizziness, drowsiness.   We are arranging a colonoscopy in the near future!  We will see you in 4 months!   I enjoyed seeing you again today! As you know, I value our relationship and want to provide genuine, compassionate, and quality care. I welcome your feedback. If you receive a survey regarding your visit,  I greatly appreciate you taking time to fill this out. See you next time!  Annitta Needs, PhD, ANP-BC [facility]

## 2020-02-25 NOTE — Progress Notes (Signed)
Referring Provider: Kathyrn Drown, MD Primary Care Physician:  Kathyrn Drown, MD Primary GI: Dr. Gala Romney   Chief Complaint  Patient presents with  . Diarrhea    still ongoig issue  . Gastroesophageal Reflux    has improved    HPI:   Aimee Dixon is a 69 y.o. female presenting today with a history of adenomas and colonoscopy due 2022, chronic diarrhea, GERD, for follow-up. 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   Started on Protonix for GERD at last visit in June 2021. Protonix has helped with GERD symptoms. No dysphagia. No N/V.   Bentyl 2-3 times per day. Loose stool about once per day. Not as much as she was before but still present. Feels improved but still concern. Postprandial urgency. No rectal bleeding. Sometimes abdominal discomfort/cramping. No weight loss.   Past Medical History:  Diagnosis Date  . Hyperlipidemia   . Hypertension   . IDA (iron deficiency anemia)   . Tubular adenoma of colon     Past Surgical History:  Procedure Laterality Date  . ABDOMINAL HYSTERECTOMY    . COLONOSCOPY  06/30/2003   LTJ:QZES papilla/Otherwise,normal rectum/Pedunculated polyp at middescending colon/Smaller pedunculated polyp at hepatic flexure, cold snared  . COLONOSCOPY   08/07/2006   PQZ:[id number] internal hemorrhoids, otherwise normal rectum/Diminutive ascending colon adenomatous polyps  . COLONOSCOPY  08/03/2009   RMR: tubular adenomas removed from hepatic flexure & desc colon, internal hemorrhoids and anal papilla otherwise norma;  . COLONOSCOPY N/A 10/03/2012   Procedure: COLONOSCOPY;  Surgeon: Daneil Dolin, MD;  [facility]: [facility];  Service: Endoscopy;  Laterality: N/A;  2:15  . COLONOSCOPY N/A 01/17/2018   Three 4-7 mm polyps in descending colon and at hepatic flexure, s/p removal. Tubular adenomas. Surveillance in 2022.   Marland Kitchen POLYPECTOMY  01/17/2018   Procedure: POLYPECTOMY;  Surgeon: Daneil Dolin,  MD;  [facility]: [facility];  Service: Endoscopy;;  hepatic flexure x2;descending colon    Current Outpatient Medications  Medication Sig Dispense Refill  . acetaminophen (TYLENOL) 500 MG tablet Take 500 mg by mouth every 6 (six) hours as needed.    Marland Kitchen albuterol (VENTOLIN HFA) 108 (90 Base) MCG/ACT inhaler Inhale 2 puffs into the lungs every 4 (four) hours as needed for wheezing. 18 g 3  . atorvastatin (LIPITOR) 20 MG tablet TAKE 1 TABLET(20 MG) BY MOUTH DAILY 90 tablet 1  . azelastine (ASTELIN) 0.1 % nasal spray Place 2 sprays into both nostrils 2 (two) times daily. 30 mL 12  . dicyclomine (BENTYL) 10 MG capsule Take 1 capsule (10 mg total) by mouth 4 (four) times daily -  before meals and at bedtime. (Patient taking differently: Take 10 mg by mouth 4 (four) times daily -  before meals and at bedtime. 3 times per day) 120 capsule 3  . fexofenadine (ALLEGRA) 180 MG tablet Take 180 mg by mouth daily.    Marland Kitchen guaiFENesin (MUCINEX) 600 MG 12 hr tablet Take 600 mg by mouth daily.     Marland Kitchen lisinopril (ZESTRIL) 20 MG tablet Take 1 tablet (20 mg total) by mouth daily. 90 tablet 1  . pantoprazole (PROTONIX) 40 MG tablet Take 1 tablet (40 mg total) by mouth daily. 30 minutes 30 tablet 3  . sertraline (ZOLOFT) 100 MG tablet TAKE 1 TABLET BY MOUTH DAILY 90 tablet 0   No current facility-administered medications for this visit.    Allergies as of 02/25/2020 -  Review Complete 02/25/2020  Allergen Reaction Noted  . Norvasc [amlodipine besylate] Other (See Comments) 10/05/2012    Family History  Problem Relation Age of Onset  . Lung cancer Father   . Breast cancer Mother 33  . Colon cancer Neg Hx     Social History   Socioeconomic History  . Marital status: Married    Spouse name: Not on file  . Number of children: 1  . Years of education: Not on file  . Highest education level: Not on file  Occupational History  . Occupation: Medical laboratory scientific officer: [employer]  Tobacco Use  .  Smoking status: Never Smoker  . Smokeless tobacco: Never Used  Vaping Use  . Vaping Use: Never used  Substance and Sexual Activity  . Alcohol use: No  . Drug use: No  . Sexual activity: Yes    Birth control/protection: Surgical  Other Topics Concern  . Not on file  Social History Narrative   Lives w/ husband   Social Determinants of Health   Financial Resource Strain:   . Difficulty of Paying Living Expenses: Not on file  Food Insecurity:   . Worried About Charity fundraiser in the Last Year: Not on file  . Ran Out of Food in the Last Year: Not on file  Transportation Needs:   . Lack of Transportation (Medical): Not on file  . Lack of Transportation (Non-Medical): Not on file  Physical Activity:   . Days of Exercise per Week: Not on file  . Minutes of Exercise per Session: Not on file  Stress:   . Feeling of Stress : Not on file  Social Connections:   . Frequency of Communication with Friends and Family: Not on file  . Frequency of Social Gatherings with Friends and Family: Not on file  . Attends Religious Services: Not on file  . Active Member of Clubs or Organizations: Not on file  . Attends Archivist Meetings: Not on file  . Marital Status: Not on file    Review of Systems: Gen: Denies fever, chills, anorexia. Denies fatigue, weakness, weight loss.  CV: Denies chest pain, palpitations, syncope, peripheral edema, and claudication. Resp: Denies dyspnea at rest, cough, wheezing, coughing up blood, and pleurisy. GI: see HPI Derm: Denies rash, itching, dry skin Psych: Denies depression, anxiety, memory loss, confusion. No homicidal or suicidal ideation.  Heme: Denies bruising, bleeding, and enlarged lymph nodes.  Physical Exam: BP (!) 182/92   Pulse 93   Temp (!) 97.3 F (36.3 C)   Ht 5\' 2"  (1.575 m)   Wt 209 lb 9.6 oz (95.1 kg)   BMI 38.34 kg/m  General:   Alert and oriented. No distress noted. Pleasant and cooperative.  Head:  Normocephalic and  atraumatic. Eyes:  Conjuctiva clear without scleral icterus. Mouth:  Mask in place Cardiac: S1 S2 present with systolic murmur Lungs: clear bilaterally Abdomen:  +BS, soft, non-tender and non-distended. No rebound or guarding. No HSM or masses noted. Msk:  Symmetrical without gross deformities. Normal posture. Extremities:  Without edema. Neurologic:  Alert and  oriented x4 Psych:  Alert and cooperative. Normal mood and affect.  ASSESSMENT: Aimee Dixon is a 69 y.o. female presenting today with history of adenomas with colonoscopy already slated for 2022, now with more frequent stool for the past year and urgent postprandial symptoms. Initially, she was treated as more post-infectious IBS presentation and did show some improvement with Bentyl although still persistent. No  overt GI bleeding. No alarm signs/symptoms. We discussed pursuing colonoscopy now with biopsy. I suspect this is still more of a post-infectious IBS presentation.   GERD well-managed on Protonix once daily.    PLAN:  Proceed with colonoscopy by Dr. Gala Romney in near future using Propofol: the risks, benefits, and alternatives have been discussed with the patient in detail. The patient states understanding and desires to proceed.   Continue Bentyl prn  Protonix once daily  Return in 4 months  Annitta Needs, PhD, [facility]

## 2020-02-25 NOTE — Telephone Encounter (Signed)
Lmtcb for pt.  

## 2020-03-18 ENCOUNTER — Other Ambulatory Visit: Payer: Self-pay | Admitting: Gastroenterology

## 2020-03-25 ENCOUNTER — Other Ambulatory Visit (HOSPITAL_COMMUNITY)
Admission: RE | Admit: 2020-03-25 | Discharge: 2020-03-25 | Disposition: A | Discharge status: Home / Self Care | Payer: Medicare Other | Source: Ambulatory Visit | Attending: Internal Medicine | Admitting: Internal Medicine

## 2020-03-25 ENCOUNTER — Other Ambulatory Visit: Payer: Self-pay

## 2020-03-25 DIAGNOSIS — Z20822 Contact with and (suspected) exposure to covid-19: Secondary | ICD-10-CM | POA: Diagnosis not present

## 2020-03-25 DIAGNOSIS — Z01812 Encounter for preprocedural laboratory examination: Secondary | ICD-10-CM | POA: Insufficient documentation

## 2020-03-25 LAB — SARS CORONAVIRUS 2 (TAT 6-24 HRS): SARS Coronavirus 2: NEGATIVE

## 2020-03-26 ENCOUNTER — Ambulatory Visit (HOSPITAL_COMMUNITY)
Admission: RE | Admit: 2020-03-26 | Discharge: 2020-03-26 | Disposition: A | Discharge status: Home / Self Care | Payer: Medicare Other | Attending: Internal Medicine | Admitting: Internal Medicine

## 2020-03-26 ENCOUNTER — Ambulatory Visit (HOSPITAL_COMMUNITY): Payer: Medicare Other | Admitting: Anesthesiology

## 2020-03-26 ENCOUNTER — Encounter (HOSPITAL_COMMUNITY): Payer: Self-pay | Admitting: Internal Medicine

## 2020-03-26 ENCOUNTER — Other Ambulatory Visit: Payer: Self-pay

## 2020-03-26 ENCOUNTER — Encounter (HOSPITAL_COMMUNITY)
Admission: RE | Disposition: A | Discharge status: Home / Self Care | Payer: Self-pay | Source: Home / Self Care | Attending: Internal Medicine

## 2020-03-26 DIAGNOSIS — K635 Polyp of colon: Secondary | ICD-10-CM | POA: Diagnosis not present

## 2020-03-26 DIAGNOSIS — D125 Benign neoplasm of sigmoid colon: Secondary | ICD-10-CM | POA: Insufficient documentation

## 2020-03-26 DIAGNOSIS — D123 Benign neoplasm of transverse colon: Secondary | ICD-10-CM | POA: Diagnosis not present

## 2020-03-26 DIAGNOSIS — R197 Diarrhea, unspecified: Secondary | ICD-10-CM | POA: Diagnosis not present

## 2020-03-26 DIAGNOSIS — Z888 Allergy status to other drugs, medicaments and biological substances status: Secondary | ICD-10-CM | POA: Insufficient documentation

## 2020-03-26 DIAGNOSIS — K529 Noninfective gastroenteritis and colitis, unspecified: Secondary | ICD-10-CM | POA: Diagnosis not present

## 2020-03-26 HISTORY — PX: BIOPSY: SHX5522

## 2020-03-26 HISTORY — PX: POLYPECTOMY: SHX5525

## 2020-03-26 HISTORY — PX: COLONOSCOPY WITH PROPOFOL: SHX5780

## 2020-03-26 SURGERY — COLONOSCOPY WITH PROPOFOL
Anesthesia: General

## 2020-03-26 MED ORDER — CHLORHEXIDINE GLUCONATE CLOTH 2 % EX PADS: 6.0000 | MEDICATED_PAD | Freq: Once | CUTANEOUS | Status: DC

## 2020-03-26 MED ORDER — MEPERIDINE HCL 50 MG/ML IJ SOLN: 6.2500 mg | INTRAMUSCULAR | Status: DC | PRN

## 2020-03-26 MED ORDER — STERILE WATER FOR IRRIGATION IR SOLN
Status: DC | PRN
  Administered 2020-03-26: 200 mL

## 2020-03-26 MED ORDER — OXYCODONE HCL 5 MG PO TABS: 5.0000 mg | ORAL_TABLET | Freq: Once | ORAL | Status: DC | PRN

## 2020-03-26 MED ORDER — HYDROMORPHONE HCL 1 MG/ML IJ SOLN: 0.2500 mg | INTRAMUSCULAR | Status: DC | PRN

## 2020-03-26 MED ORDER — ONDANSETRON HCL 4 MG/2ML IJ SOLN: 4.0000 mg | Freq: Once | INTRAMUSCULAR | Status: DC | PRN

## 2020-03-26 MED ORDER — PROPOFOL 10 MG/ML IV BOLUS
INTRAVENOUS | Status: DC | PRN
  Administered 2020-03-26: 20 mg via INTRAVENOUS
  Administered 2020-03-26: 80 mg via INTRAVENOUS

## 2020-03-26 MED ORDER — LIDOCAINE HCL (CARDIAC) PF 100 MG/5ML IV SOSY
PREFILLED_SYRINGE | INTRAVENOUS | Status: DC | PRN
  Administered 2020-03-26: 100 mg via INTRATRACHEAL

## 2020-03-26 MED ORDER — PROPOFOL 500 MG/50ML IV EMUL
INTRAVENOUS | Status: DC | PRN
  Administered 2020-03-26: 150 ug/kg/min via INTRAVENOUS
  Administered 2020-03-26: 100 ug/kg/min via INTRAVENOUS

## 2020-03-26 MED ORDER — ACETAMINOPHEN 10 MG/ML IV SOLN: 1000.0000 mg | Freq: Once | INTRAVENOUS | Status: DC | PRN

## 2020-03-26 MED ORDER — OXYCODONE HCL 5 MG/5ML PO SOLN: 5.0000 mg | Freq: Once | ORAL | Status: DC | PRN

## 2020-03-26 MED ORDER — DROPERIDOL 2.5 MG/ML IJ SOLN: 0.6250 mg | Freq: Once | INTRAMUSCULAR | Status: DC | PRN

## 2020-03-26 MED ORDER — LACTATED RINGERS IV SOLN: INTRAVENOUS | Status: DC

## 2020-03-26 NOTE — Op Note (Signed)
[facility] Patient Name: Aimee Dixon Procedure Date: 03/26/2020 9:58 AM MRN: [id number] Date of Birth: [birth year] Attending MD: Norvel Richards , MD CSN: [id number] Age: 69 Admit Type: Outpatient Procedure:                Colonoscopy Indications:              Chronic diarrhea Providers:                Norvel Richards, MD, Lambert Mody,                            Crystal Page, [place] Risa Grill, Technician,                            Nelma Rothman, Merchant [employer] officer Referring MD:              Medicines:                Propofol per Anesthesia Complications:            No immediate complications. Estimated Blood Loss:     Estimated blood loss was minimal. Procedure:                Pre-Anesthesia Assessment:                           - Prior to the procedure, a History and Physical                            was performed, and patient medications and                            allergies were reviewed. The patient's tolerance of                            previous anesthesia was also reviewed. The risks                            and benefits of the procedure and the sedation                            options and risks were discussed with the patient.                            All questions were answered, and informed consent                            was obtained. Prior Anticoagulants: The patient has                            taken no previous anticoagulant or antiplatelet                            agents. ASA Grade Assessment: II - A patient with  mild systemic disease. After reviewing the risks                            and benefits, the patient was deemed in                            satisfactory condition to undergo the procedure.                           After obtaining informed consent, the colonoscope                            was passed under direct vision. Throughout the                            procedure, the patient's  blood pressure, pulse, and                            oxygen saturations were monitored continuously. The                            CF-[id number] ([id number]) scope was introduced through                            the anus and advanced to the 5 cm into the ileum.                            The colonoscopy was performed without difficulty.                            The patient tolerated the procedure well. The                            quality of the bowel preparation was adequate. Scope In: 10:38:11 AM Scope Out: 10:58:10 AM Scope Withdrawal Time: 0 hours 12 minutes 45 seconds  Total Procedure Duration: 0 hours 19 minutes 59 seconds  Findings:      The perianal and digital rectal examinations were normal.      A 10 mm polyp was found in the splenic flexure. The polyp was       semi-pedunculated. The polyp was removed with a hot snare. Resection and       retrieval were complete. Estimated blood loss: none.      The exam was otherwise without abnormality on direct and retroflexion       views. Distal 5 cm of TI appeared normal. Segmental biopsies of the       right and left colon to evaluate for microscopic colitis Impression:               - One 10 mm polyp at the splenic flexure, removed                            with a hot snare. Resected and retrieved.                           -  The examination was otherwise normal on direct                            and retroflexion views. Normal terminal ileum.                            Status post segmental biopsy Moderate Sedation:      Moderate (conscious) sedation was personally administered by an       anesthesia professional. The following parameters were monitored: oxygen       saturation, heart rate, blood pressure, respiratory rate, EKG, adequacy       of pulmonary ventilation, and response to care. Recommendation:           - Patient has a contact number available for                            emergencies. The signs and symptoms of  potential                            delayed complications were discussed with the                            patient. Return to normal activities tomorrow.                            Written discharge instructions were provided to the                            patient.                           - Advance diet as tolerated.                           - Continue present medications.                           - Repeat colonoscopy date to be determined after                            pending pathology results are reviewed for                            surveillance.                           - Return to GI office in 2 months. Procedure Code(s):        --- Professional ---                           [phone], Colonoscopy, flexible; with removal of                            tumor(s), polyp(s), or other lesion(s) by snare  technique Diagnosis Code(s):        --- Professional ---                           K63.5, Polyp of colon                           K52.9, Noninfective gastroenteritis and colitis,                            unspecified CPT copyright 2019 American Medical Association. All rights reserved. The codes documented in this report are preliminary and upon coder review may  be revised to meet current compliance requirements. Cristopher Estimable. Otila Starn, MD Norvel Richards, MD 03/26/2020 11:08:44 AM This report has been signed electronically. Number of Addenda: 0

## 2020-03-26 NOTE — Anesthesia Postprocedure Evaluation (Signed)
Anesthesia Post Note  Patient: Aimee Dixon  Procedure(s) Performed: COLONOSCOPY WITH PROPOFOL (N/A ) BIOPSY POLYPECTOMY  Patient location during evaluation: PACU Anesthesia Type: General Level of consciousness: awake, oriented and awake and alert Pain management: pain level controlled Vital Signs Assessment: post-procedure vital signs reviewed and stable Respiratory status: spontaneous breathing Cardiovascular status: blood pressure returned to baseline Postop Assessment: no apparent nausea or vomiting Anesthetic complications: no   No complications documented.   Last Vitals:  Vitals:   03/26/20 1015  BP: (!) 168/92  Pulse: (!) 107  Resp: 20  Temp: 37.3 C  SpO2: 98%    Last Pain:  Vitals:   03/26/20 1035  TempSrc:   PainSc: 0-No pain                 Karna Dupes

## 2020-03-26 NOTE — Transfer of Care (Signed)
Immediate Anesthesia Transfer of Care Note  Patient: Aimee Dixon  Procedure(s) Performed: COLONOSCOPY WITH PROPOFOL (N/A ) BIOPSY POLYPECTOMY  Patient Location: PACU  Anesthesia Type:General  Level of Consciousness: awake, alert  and oriented  Airway & Oxygen Therapy: Patient Spontanous Breathing  Post-op Assessment: Report given to RN and Post -op Vital signs reviewed and stable  Post vital signs: Reviewed and stable  Last Vitals:  Vitals Value Taken Time  BP    Temp    Pulse    Resp    SpO2      Last Pain:  Vitals:   03/26/20 1035  TempSrc:   PainSc: 0-No pain      Patients Stated Pain Goal: 8 (03/15/50 0258)  Complications: No complications documented.

## 2020-03-26 NOTE — Discharge Instructions (Signed)
Colonoscopy Discharge Instructions  Read the instructions outlined below and refer to this sheet in the next few weeks. These discharge instructions provide you with general information on caring for yourself after you leave the hospital. Your doctor may also give you specific instructions. While your treatment has been planned according to the most current medical practices available, unavoidable complications occasionally occur. If you have any problems or questions after discharge, call Dr. Gala Romney at [phone]. ACTIVITY  You may resume your regular activity, but move at a slower pace for the next 24 hours.   Take frequent rest periods for the next 24 hours.   Walking will help get rid of the air and reduce the bloated feeling in your belly (abdomen).   No driving for 24 hours (because of the medicine (anesthesia) used during the test).    Do not sign any important legal documents or operate any machinery for 24 hours (because of the anesthesia used during the test).  NUTRITION  Drink plenty of fluids.   You may resume your normal diet as instructed by your doctor.   Begin with a light meal and progress to your normal diet. Heavy or fried foods are harder to digest and may make you feel sick to your stomach (nauseated).   Avoid alcoholic beverages for 24 hours or as instructed.  MEDICATIONS  You may resume your normal medications unless your doctor tells you otherwise.  WHAT YOU CAN EXPECT TODAY  Some feelings of bloating in the abdomen.   Passage of more gas than usual.   Spotting of blood in your stool or on the toilet paper.  IF YOU HAD POLYPS REMOVED DURING THE COLONOSCOPY:  No aspirin products for 7 days or as instructed.   No alcohol for 7 days or as instructed.   Eat a soft diet for the next 24 hours.  FINDING OUT THE RESULTS OF YOUR TEST Not all test results are available during your visit. If your test results are not back during the visit, make an appointment  with your caregiver to find out the results. Do not assume everything is normal if you have not heard from your caregiver or the medical facility. It is important for you to follow up on all of your test results.  SEEK IMMEDIATE MEDICAL ATTENTION IF:  You have more than a spotting of blood in your stool.   Your belly is swollen (abdominal distention).   You are nauseated or vomiting.   You have a temperature over 101.   You have abdominal pain or discomfort that is severe or gets worse throughout the day.    1 polyp removed from your colon today.  Samples taken to evaluate for diarrhea  Further recommendations to follow pending review of pathology report  At patient request, I called Michail Jewels at [phone] -reviewed results and recommendations    Monitored Anesthesia Care, Care After These instructions provide you with information about caring for yourself after your procedure. Your health care provider may also give you more specific instructions. Your treatment has been planned according to current medical practices, but problems sometimes occur. Call your health care provider if you have any problems or questions after your procedure. What can I expect after the procedure? After your procedure, you may:  Feel sleepy for several hours.  Feel clumsy and have poor balance for several hours.  Feel forgetful about what happened after the procedure.  Have poor judgment for several hours.  Feel nauseous or vomit.  Have  a sore throat if you had a breathing tube during the procedure. Follow these instructions at home: For at least 24 hours after the procedure:      Have a responsible adult stay with you. It is important to have someone help care for you until you are awake and alert.  Rest as needed.  Do not: ? Participate in activities in which you could fall or become injured. ? Drive. ? Use heavy machinery. ? Drink alcohol. ? Take sleeping pills or medicines  that cause drowsiness. ? Make important decisions or sign legal documents. ? Take care of children on your own. Eating and drinking  Follow the diet that is recommended by your health care provider.  If you vomit, drink water, juice, or soup when you can drink without vomiting.  Make sure you have little or no nausea before eating solid foods. General instructions  Take over-the-counter and prescription medicines only as told by your health care provider.  If you have sleep apnea, surgery and certain medicines can increase your risk for breathing problems. Follow instructions from your health care provider about wearing your sleep device: ? Anytime you are sleeping, including during daytime naps. ? While taking prescription pain medicines, sleeping medicines, or medicines that make you drowsy.  If you smoke, do not smoke without supervision.  Keep all follow-up visits as told by your health care provider. This is important. Contact a health care provider if:  You keep feeling nauseous or you keep vomiting.  You feel light-headed.  You develop a rash.  You have a fever. Get help right away if:  You have trouble breathing. Summary  For several hours after your procedure, you may feel sleepy and have poor judgment.  Have a responsible adult stay with you for at least 24 hours or until you are awake and alert. This information is not intended to replace advice given to you by your health care provider. Make sure you discuss any questions you have with your health care provider. Document Revised: 08/14/2017 Document Reviewed: 09/06/2015 Elsevier Patient Education  [place].

## 2020-03-26 NOTE — Interval H&P Note (Signed)
History and Physical Interval Note:  03/26/2020 10:30 AM  Aimee Dixon  has presented today for surgery, with the diagnosis of change in bowels.  The various methods of treatment have been discussed with the patient and family. After consideration of risks, benefits and other options for treatment, the patient has consented to  Procedure(s) with comments: COLONOSCOPY WITH PROPOFOL (N/A) - 12:00pm as a surgical intervention.  The patient's history has been reviewed, patient examined, no change in status, stable for surgery.  I have reviewed the patient's chart and labs.  Questions were answered to the patient's satisfaction.     Aimee Dixon  No change.  Colonoscopy now for chronic diarrhea. The risks, benefits, limitations, alternatives and imponderables have been reviewed with the patient. Questions have been answered. All parties are agreeable.

## 2020-03-26 NOTE — Anesthesia Preprocedure Evaluation (Signed)
Anesthesia Evaluation  Patient identified by MRN, date of birth, ID band Patient awake    Reviewed: Allergy & Precautions, NPO status , Patient's Chart, lab work & pertinent test results, reviewed documented beta blocker date and time   Airway Mallampati: II  TM Distance: >3 FB Neck ROM: Full    Dental no notable dental hx.    Pulmonary neg pulmonary ROS,    Pulmonary exam normal breath sounds clear to auscultation       Cardiovascular hypertension, Normal cardiovascular exam Rhythm:Regular Rate:Normal     Neuro/Psych negative neurological ROS     GI/Hepatic Neg liver ROS, GERD  ,  Endo/Other  negative endocrine ROS  Renal/GU negative Renal ROS     Musculoskeletal   Abdominal   Peds  Hematology  (+) anemia ,   Anesthesia Other Findings   Reproductive/Obstetrics                             Anesthesia Physical Anesthesia Plan  ASA: II  Anesthesia Plan: General   Post-op Pain Management:    Induction: Intravenous  PONV Risk Score and Plan:   Airway Management Planned: Nasal Cannula  Additional Equipment:   Intra-op Plan:   Post-operative Plan:   Informed Consent: I have reviewed the patients History and Physical, chart, labs and discussed the procedure including the risks, benefits and alternatives for the proposed anesthesia with the patient or authorized representative who has indicated his/her understanding and acceptance.     Dental advisory given  Plan Discussed with: CRNA  Anesthesia Plan Comments:         Anesthesia Quick Evaluation

## 2020-03-27 ENCOUNTER — Encounter: Payer: Self-pay | Admitting: Internal Medicine

## 2020-03-27 LAB — SURGICAL PATHOLOGY

## 2020-03-31 ENCOUNTER — Encounter (HOSPITAL_COMMUNITY): Payer: Self-pay | Admitting: Internal Medicine

## 2020-04-20 ENCOUNTER — Encounter: Payer: Self-pay | Admitting: Family Medicine

## 2020-04-20 ENCOUNTER — Other Ambulatory Visit: Payer: Self-pay

## 2020-04-20 ENCOUNTER — Ambulatory Visit (INDEPENDENT_AMBULATORY_CARE_PROVIDER_SITE_OTHER): Payer: Medicare Other | Admitting: Family Medicine

## 2020-04-20 VITALS — BP 138/94 | HR 98 | Temp 97.8°F | Ht 62.0 in | Wt 207.0 lb

## 2020-04-20 DIAGNOSIS — R7303 Prediabetes: Secondary | ICD-10-CM

## 2020-04-20 DIAGNOSIS — I10 Essential (primary) hypertension: Secondary | ICD-10-CM | POA: Diagnosis not present

## 2020-04-20 DIAGNOSIS — Z23 Encounter for immunization: Secondary | ICD-10-CM

## 2020-04-20 DIAGNOSIS — E782 Mixed hyperlipidemia: Secondary | ICD-10-CM | POA: Diagnosis not present

## 2020-04-20 DIAGNOSIS — Z79899 Other long term (current) drug therapy: Secondary | ICD-10-CM | POA: Diagnosis not present

## 2020-04-20 DIAGNOSIS — F324 Major depressive disorder, single episode, in partial remission: Secondary | ICD-10-CM

## 2020-04-20 MED ORDER — LISINOPRIL 20 MG PO TABS
20.0000 mg | ORAL_TABLET | Freq: Every day | ORAL | 1 refills | Status: DC
Start: 2020-04-20 — End: 2020-05-12

## 2020-04-20 MED ORDER — ATORVASTATIN CALCIUM 20 MG PO TABS
20.0000 mg | ORAL_TABLET | Freq: Every day | ORAL | 1 refills | Status: DC
Start: 2020-04-20 — End: 2020-07-02

## 2020-04-20 NOTE — Progress Notes (Signed)
Subjective:    Patient ID: Aimee Dixon, female    DOB: [birth year], 68 y.o.   MRN: [id number]  Hyperlipidemia This is a chronic problem. Pertinent negatives include no chest pain or shortness of breath. Treatments tried: lipitor. Compliance problems include adherence to diet and adherence to exercise (not much time for exercise, does take meds every day).    Pt states no concerns today but would like senior flu vaccine.  Need for vaccination - Plan: Flu Vaccine QUAD High Dose(Fluad)  Mixed hyperlipidemia - Plan: Lipid panel  Prediabetes - Plan: Hemoglobin A1c  HTN (hypertension), benign - Plan: Basic metabolic panel  High risk medication use - Plan: Hepatic function panel  Depression, major, single episode, in partial remission ([place])  Morbid obesity ([place])  Patient with mild hyperlipidemia.  Takes her medicine on a regular basis tries to watch her diet. Patient did not take her blood pressure medicine today.  Tries to minimize salt in the diet.  It is difficult for her to stay active in the sense of doing walking because she is so busy with taking care of her husband. Has some depression symptoms related to the fact that she is having to take care of her husband so much.  She loves him but it has been stressful.   Review of Systems  Constitutional: Negative for activity change, appetite change and fatigue.  HENT: Negative for congestion and rhinorrhea.   Respiratory: Negative for cough and shortness of breath.   Cardiovascular: Negative for chest pain and leg swelling.  Gastrointestinal: Negative for abdominal pain and diarrhea.  Endocrine: Negative for polydipsia and polyphagia.  Skin: Negative for color change.  Neurological: Negative for dizziness and weakness.  Psychiatric/Behavioral: Negative for behavioral problems and confusion.       Objective:   Physical Exam Vitals reviewed.  Constitutional:      General: She is not in acute distress. HENT:     Head:  Normocephalic and atraumatic.  Eyes:     General:        Right eye: No discharge.        Left eye: No discharge.  Neck:     Trachea: No tracheal deviation.  Cardiovascular:     Rate and Rhythm: Normal rate and regular rhythm.     Heart sounds: Normal heart sounds. No murmur heard.   Pulmonary:     Effort: Pulmonary effort is normal. No respiratory distress.     Breath sounds: Normal breath sounds.  Lymphadenopathy:     Cervical: No cervical adenopathy.  Skin:    General: Skin is warm and dry.  Neurological:     Mental Status: She is alert.     Coordination: Coordination normal.  Psychiatric:        Behavior: Behavior normal.           Assessment & Plan:  1. Need for vaccination Flu shot today  - Flu Vaccine QUAD High Dose(Fluad)  2. Mixed hyperlipidemia Lipid profile Continue medication  - Lipid panel  3. Prediabetes Watch starches in diet stay physically active - Hemoglobin A1c  4. HTN (hypertension), benign Blood pressure could be better.  She will follow-up in December for recheck of blood pressure.  She will make sure she takes her medicine on a regular basis did not take it today.  Watch salt in diet. - Basic metabolic panel  5. High risk medication use Check liver function - Hepatic function panel Morbid obesity watch diet stay physically active try to  keep weight down  Mild depression related to having to take care of her disabled husband she is taking her medicine on a regular basis does not feel she needs any counseling we did talk about stress relief techniques  Blood pressure recheck in December follow-up office visit in 4 to 6 months lab work ordered in December

## 2020-05-11 ENCOUNTER — Telehealth: Payer: Self-pay | Admitting: *Deleted

## 2020-05-11 NOTE — Telephone Encounter (Signed)
Pt in office with husband today and asked to have bp checked. Her bp was 156/92. Taking lisinopril 20mg  one daily.  [facility] [address]

## 2020-05-12 ENCOUNTER — Other Ambulatory Visit: Payer: Self-pay | Admitting: *Deleted

## 2020-05-12 ENCOUNTER — Other Ambulatory Visit: Payer: Self-pay | Admitting: Family Medicine

## 2020-05-12 DIAGNOSIS — I10 Essential (primary) hypertension: Secondary | ICD-10-CM

## 2020-05-12 MED ORDER — VALSARTAN-HYDROCHLOROTHIAZIDE 80-12.5 MG PO TABS: 1.0000 | ORAL_TABLET | Freq: Every day | ORAL | 4 refills | Status: DC

## 2020-05-12 NOTE — Telephone Encounter (Signed)
Patient notified of Dr. Bary Leriche recommendation and was transferred to the front to schedule an appointment.

## 2020-05-12 NOTE — Telephone Encounter (Signed)
Prescription for valsartan/HCTZ was sent in.  Stop lisinopril.  This new medication is a combination of blood pressure medicine with a low-dose diuretic.  1 each morning.  Please do a follow-up visit in 30 days for blood pressure recheck.  Please do metabolic 7 in 3 weeks.

## 2020-05-12 NOTE — Telephone Encounter (Signed)
Lmtc. Lab work ordered.

## 2020-05-22 ENCOUNTER — Other Ambulatory Visit: Payer: Self-pay | Admitting: Family Medicine

## 2020-05-25 NOTE — Telephone Encounter (Signed)
04/20/20 °

## 2020-06-08 ENCOUNTER — Other Ambulatory Visit: Payer: Self-pay | Admitting: Family Medicine

## 2020-06-08 DIAGNOSIS — Z79899 Other long term (current) drug therapy: Secondary | ICD-10-CM | POA: Diagnosis not present

## 2020-06-08 DIAGNOSIS — I10 Essential (primary) hypertension: Secondary | ICD-10-CM | POA: Diagnosis not present

## 2020-06-08 DIAGNOSIS — E782 Mixed hyperlipidemia: Secondary | ICD-10-CM | POA: Diagnosis not present

## 2020-06-08 DIAGNOSIS — R7303 Prediabetes: Secondary | ICD-10-CM | POA: Diagnosis not present

## 2020-06-09 LAB — BASIC METABOLIC PANEL WITH GFR
BUN: 12 mg/dL (ref 7–25)
CO2: 33 mmol/L — ABNORMAL HIGH (ref 20–32)
Calcium: 10.1 mg/dL (ref 8.6–10.4)
Chloride: 101 mmol/L (ref 98–110)
Creat: 0.62 mg/dL (ref 0.50–0.99)
GFR, Est African American: 108 mL/min/{1.73_m2} (ref >=60)
GFR, Est Non African American: 93 mL/min/{1.73_m2} (ref >=60)
Glucose, Bld: 120 mg/dL — ABNORMAL HIGH (ref 65–99)
Potassium: 3.1 mmol/L — ABNORMAL LOW (ref 3.5–5.3)
Sodium: 143 mmol/L (ref 135–146)

## 2020-06-09 LAB — HEPATIC FUNCTION PANEL
AG Ratio: 1.5 (calc) (ref 1.0–2.5)
ALT: 15 U/L (ref 6–29)
AST: 18 U/L (ref 10–35)
Albumin: 4.3 g/dL (ref 3.6–5.1)
Alkaline phosphatase (APISO): 95 U/L (ref 37–153)
Bilirubin, Direct: 0.1 mg/dL (ref 0.0–0.2)
Globulin: 2.8 g/dL (ref 1.9–3.7)
Indirect Bilirubin: 0.4 mg/dL (ref 0.2–1.2)
Total Bilirubin: 0.5 mg/dL (ref 0.2–1.2)
Total Protein: 7.1 g/dL (ref 6.1–8.1)

## 2020-06-09 LAB — HEMOGLOBIN A1C: Hgb A1c MFr Bld: 6.3 %{Hb} — ABNORMAL HIGH (ref <5.7)

## 2020-06-09 LAB — LIPID PANEL
Cholesterol: 194 mg/dL (ref <200)
HDL: 45 mg/dL — ABNORMAL LOW (ref >=50)
LDL Cholesterol (Calc): 125 mg/dL (calc) — ABNORMAL HIGH
Non-HDL Cholesterol (Calc): 149 mg/dL (calc) — ABNORMAL HIGH (ref <130)
Total CHOL/HDL Ratio: 4.3 (calc) (ref <5.0)
Triglycerides: 127 mg/dL (ref <150)

## 2020-06-12 ENCOUNTER — Ambulatory Visit: Payer: Medicare Other | Admitting: Family Medicine

## 2020-06-18 ENCOUNTER — Ambulatory Visit: Payer: Medicare Other | Admitting: Family Medicine

## 2020-06-24 ENCOUNTER — Other Ambulatory Visit: Payer: Self-pay

## 2020-06-24 ENCOUNTER — Ambulatory Visit: Payer: Medicare Other | Admitting: Gastroenterology

## 2020-06-24 ENCOUNTER — Encounter: Payer: Self-pay | Admitting: Gastroenterology

## 2020-06-24 VITALS — BP 159/84 | HR 87 | Temp 97.5°F | Ht 62.0 in | Wt 209.8 lb

## 2020-06-24 DIAGNOSIS — K219 Gastro-esophageal reflux disease without esophagitis: Secondary | ICD-10-CM

## 2020-06-24 DIAGNOSIS — K529 Noninfective gastroenteritis and colitis, unspecified: Secondary | ICD-10-CM | POA: Insufficient documentation

## 2020-06-24 MED ORDER — RIFAXIMIN 550 MG PO TABS: 550.0000 mg | ORAL_TABLET | Freq: Three times a day (TID) | ORAL | 0 refills | Status: AC

## 2020-06-24 NOTE — Progress Notes (Signed)
Referring Provider: Kathyrn Drown, MD Primary Care Physician:  Kathyrn Drown, MD Primary GI: Dr. Gala Romney   Chief Complaint  Patient presents with  . Follow-up    HPI:   Aimee Dixon is a  70 year old female with history of chronic diarrhea, GERD, adenomas with most recent colonoscopy Oct 2021 with one 10 mm polyp (tubular adenoma), otherwise normal, s/p segmental biopsies (benign). 5 year surveillance due.   One time a day loose stool. Good appetite. Chocolate triggers it. Eating out will trigger but also eating at home. Taking dicyclomine at least once per day around breakfast. Sometimes forgets. Sometimes gas and burping depending on food choices. Protonix once daily. No symptoms if avoiding GERD triggers.   Past Medical History:  Diagnosis Date  . Hyperlipidemia   . Hypertension   . IDA (iron deficiency anemia)   . Tubular adenoma of colon     Past Surgical History:  Procedure Laterality Date  . ABDOMINAL HYSTERECTOMY    . BIOPSY  03/26/2020   Procedure: BIOPSY;  Surgeon: Daneil Dolin, MD;  [facility];  Service: Endoscopy;;  . COLONOSCOPY  06/30/2003   WYO:VZCH papilla/Otherwise,normal rectum/Pedunculated polyp at middescending colon/Smaller pedunculated polyp at hepatic flexure, cold snared  . COLONOSCOPY   08/07/2006   YIF:OYDXAJO internal hemorrhoids, otherwise normal rectum/Diminutive ascending colon adenomatous polyps  . COLONOSCOPY  08/03/2009   RMR: tubular adenomas removed from hepatic flexure & desc colon, internal hemorrhoids and anal papilla otherwise norma;  . COLONOSCOPY N/A 10/03/2012   Procedure: COLONOSCOPY;  Surgeon: Daneil Dolin, MD;  Location: [facility];  Service: Endoscopy;  Laterality: N/A;  2:15  . COLONOSCOPY N/A 01/17/2018   Three 4-7 mm polyps in descending colon and at hepatic flexure, s/p removal. Tubular adenomas. Surveillance in 2022.   Marland Kitchen COLONOSCOPY WITH PROPOFOL N/A 03/26/2020    one 10 mm polyp (tubular  adenoma), otherwise normal, s/p segmental biopsies (benign)  . POLYPECTOMY  01/17/2018   Procedure: POLYPECTOMY;  Surgeon: Daneil Dolin, MD;  [facility];  Service: Endoscopy;;  hepatic flexure x2;descending colon  . POLYPECTOMY  03/26/2020   Procedure: POLYPECTOMY;  Surgeon: Daneil Dolin, MD;  Location: [facility];  Service: Endoscopy;;    Current Outpatient Medications  Medication Sig Dispense Refill  . acetaminophen (TYLENOL) 500 MG tablet Take 1,000 mg by mouth every 6 (six) hours as needed for mild pain or moderate pain.     Marland Kitchen albuterol (VENTOLIN HFA) 108 (90 Base) MCG/ACT inhaler Inhale 2 puffs into the lungs every 4 (four) hours as needed for wheezing. 18 g 3  . atorvastatin (LIPITOR) 20 MG tablet Take 1 tablet (20 mg total) by mouth daily. 90 tablet 1  . azelastine (ASTELIN) 0.1 % nasal spray Place 2 sprays into both nostrils 2 (two) times daily. (Patient taking differently: Place 2 sprays into both nostrils daily as needed for rhinitis.) 30 mL 12  . dicyclomine (BENTYL) 10 MG capsule Take 1 capsule (10 mg total) by mouth 4 (four) times daily -  before meals and at bedtime. 120 capsule 3  . fexofenadine (ALLEGRA) 180 MG tablet Take 180 mg by mouth daily.    Marland Kitchen guaiFENesin (MUCINEX) 600 MG 12 hr tablet Take 600 mg by mouth daily.     . Multiple Vitamins-Minerals (MULTIVITAMIN WITH MINERALS) tablet Take 1 tablet by mouth daily.    . pantoprazole (PROTONIX) 40 MG tablet TAKE 1 TABLET BY MOUTH DAILY (Patient taking  differently: Take 40 mg by mouth daily.) 90 tablet 3  . rifaximin (XIFAXAN) 550 MG TABS tablet Take 1 tablet (550 mg total) by mouth 3 (three) times daily for 14 days. 42 tablet 0  . sertraline (ZOLOFT) 100 MG tablet TAKE 1 TABLET BY MOUTH DAILY 90 tablet 1  . valsartan-hydrochlorothiazide (DIOVAN HCT) 80-12.5 MG tablet Take 1 tablet by mouth daily. 30 tablet 4   No current facility-administered medications for this visit.    Allergies as of 06/24/2020 -  Review Complete 06/24/2020  Allergen Reaction Noted  . Norvasc [amlodipine besylate] Other (See Comments) 10/05/2012    Family History  Problem Relation Age of Onset  . Lung cancer Father   . Breast cancer Mother 18  . Colon cancer Neg Hx     Social History   Socioeconomic History  . Marital status: Married    Spouse name: Not on file  . Number of children: 1  . Years of education: Not on file  . Highest education level: Not on file  Occupational History  . Occupation: Medical laboratory scientific officer: [employer]  Tobacco Use  . Smoking status: Never Smoker  . Smokeless tobacco: Never Used  Vaping Use  . Vaping Use: Never used  Substance and Sexual Activity  . Alcohol use: No  . Drug use: No  . Sexual activity: Yes    Birth control/protection: Surgical  Other Topics Concern  . Not on file  Social History Narrative   Lives w/ husband   Social Determinants of Health   Financial Resource Strain: Not on file  Food Insecurity: Not on file  Transportation Needs: Not on file  Physical Activity: Not on file  Stress: Not on file  Social Connections: Not on file    Review of Systems: Gen: Denies fever, chills, anorexia. Denies fatigue, weakness, weight loss.  CV: Denies chest pain, palpitations, syncope, peripheral edema, and claudication. Resp: Denies dyspnea at rest, cough, wheezing, coughing up blood, and pleurisy. GI: see HPI Derm: Denies rash, itching, dry skin Psych: Denies depression, anxiety, memory loss, confusion. No homicidal or suicidal ideation.  Heme: Denies bruising, bleeding, and enlarged lymph nodes.  Physical Exam: BP (!) 159/84   Pulse 87   Temp (!) 97.5 F (36.4 C)   Ht 5\' 2"  (1.575 m)   Wt 209 lb 12.8 oz (95.2 kg)   BMI 38.37 kg/m  General:   Alert and oriented. No distress noted. Pleasant and cooperative.  Head:  Normocephalic and atraumatic. Eyes:  Conjuctiva clear without scleral icterus. Mouth:  Mask in place Abdomen:  +BS, soft,  non-tender and non-distended. No rebound or guarding. No HSM or masses noted. Msk:  Symmetrical without gross deformities. Normal posture. Extremities:  Without edema. Neurologic:  Alert and  oriented x4 Psych:  Alert and cooperative. Normal mood and affect.  ASSESSMENT: Aimee Dixon is a 70 y.o. female presenting today with history of GERD, chronic diarrhea, recently undergoing colonoscopy with negative colonic biopsies and surveillance for adenomas due in 2026.   Continues on dicyclomine and overall improved but still with one loose stool per day. Will trial course of Xifaxan TID for 14 days. I am also checking CBC, TSH, and celiac serologies to be thorough.   GERD controlled on PPI.    PLAN:  TSH, CBC, celiac serologies Protonix daily Course of Xifaxan  Return in 4 months Colonoscopy 2026  Annitta Needs, PhD, [facility] Gastroenterology

## 2020-06-24 NOTE — Patient Instructions (Signed)
Please have blood work done at [facility].  I have sent in Xifaxan to take three times a day for 14 days. Please call if no improvement thereafter.  We will see you in 4 months!  I enjoyed seeing you again today! As you know, I value our relationship and want to provide genuine, compassionate, and quality care. I welcome your feedback. If you receive a survey regarding your visit,  I greatly appreciate you taking time to fill this out. See you next time!  Annitta Needs, PhD, ANP-BC [facility] Gastroenterology

## 2020-06-24 NOTE — Progress Notes (Signed)
Cc'ed to pcp °

## 2020-07-01 ENCOUNTER — Telehealth: Payer: Self-pay

## 2020-07-01 NOTE — Telephone Encounter (Signed)
PA was submitted for Xifaxan 550 mg. Waiting on an approval or denial.

## 2020-07-02 ENCOUNTER — Encounter: Payer: Self-pay | Admitting: Family Medicine

## 2020-07-02 ENCOUNTER — Other Ambulatory Visit: Payer: Self-pay

## 2020-07-02 ENCOUNTER — Ambulatory Visit (INDEPENDENT_AMBULATORY_CARE_PROVIDER_SITE_OTHER): Payer: Medicare Other | Admitting: Family Medicine

## 2020-07-02 VITALS — BP 134/88 | HR 94 | Temp 96.2°F | Wt 210.2 lb

## 2020-07-02 DIAGNOSIS — E876 Hypokalemia: Secondary | ICD-10-CM

## 2020-07-02 DIAGNOSIS — I10 Essential (primary) hypertension: Secondary | ICD-10-CM

## 2020-07-02 DIAGNOSIS — R7303 Prediabetes: Secondary | ICD-10-CM | POA: Diagnosis not present

## 2020-07-02 DIAGNOSIS — E782 Mixed hyperlipidemia: Secondary | ICD-10-CM

## 2020-07-02 MED ORDER — VALSARTAN 160 MG PO TABS: 160.0000 mg | ORAL_TABLET | Freq: Every day | ORAL | 1 refills | Status: DC

## 2020-07-02 MED ORDER — ATORVASTATIN CALCIUM 40 MG PO TABS: 40.0000 mg | ORAL_TABLET | Freq: Every day | ORAL | 1 refills | Status: DC

## 2020-07-02 NOTE — Telephone Encounter (Signed)
PA was approved for Xifaxan 550 mg. Approval letter will be scanned in pts chart.

## 2020-07-02 NOTE — Telephone Encounter (Signed)
Pt and the pharmacy were notified of Xifaxan approval through pts insurance.

## 2020-07-02 NOTE — Patient Instructions (Signed)
Stop valsartan HCTZ  Start Valsartan 160 mg one daily Increase Atorvastatin to 40 mg  Recheck in mid may  OV nurse visit in 2 to 3 weeks

## 2020-07-02 NOTE — Progress Notes (Signed)
   Subjective:    Patient ID: Aimee Dixon, female    DOB: Oct 18, 1950, 70 y.o.   MRN: [id number]  HPI Pt here for blood pressure recheck. Pt states no issues. Pt has not checked blood pressure outside of office.  Patient taking the medication as directed.  Watching salt in the diet.  Not able to do a lot of exercise.  Weight is gone up some during the winter.  She is going to work hard to try to bring it down some.  Denies any chest tightness pressure pain shortness of breath.  Review of Systems See above.    Objective:   Physical Exam  Blood pressure borderline not where we would like it for it to be I recommend bumping up the dose of the medicine lungs clear heart regular      Assessment & Plan:  1. HTN (hypertension), benign Increase valsartan 160 mg stop the HCTZ should gradually get better in regards to blood pressure and potassium - Hemoglobin A1c - Lipid Profile - Basic Metabolic Panel (BMET)  2. Mixed hyperlipidemia Check cholesterol A1c has prediabetes - Hemoglobin A1c - Lipid Profile - Basic Metabolic Panel (BMET)  3. Hypokalemia Hypokalemia during recent lab work stopping HCTZ should correct this - Hemoglobin A1c - Lipid Profile - Basic Metabolic Panel (BMET) Prediabetes check A1c on next visit  Lab work before next visit annual wellness visit and chronic health issues on next visit

## 2020-07-16 ENCOUNTER — Other Ambulatory Visit (INDEPENDENT_AMBULATORY_CARE_PROVIDER_SITE_OTHER): Payer: Medicare Other | Admitting: Family Medicine

## 2020-07-16 ENCOUNTER — Other Ambulatory Visit: Payer: Self-pay

## 2020-07-16 DIAGNOSIS — I10 Essential (primary) hypertension: Secondary | ICD-10-CM | POA: Diagnosis not present

## 2020-07-16 MED ORDER — HYDRALAZINE HCL 25 MG PO TABS: ORAL_TABLET | ORAL | 3 refills | Status: DC

## 2020-07-16 NOTE — Addendum Note (Signed)
Addended by: Sallee Lange A on: 07/16/2020 07:46 PM   Modules accepted: Level of Service

## 2020-07-16 NOTE — Progress Notes (Signed)
Patient was seen today as a nurse visit for elevated blood pressure after several checks her blood pressure was still elevated Even with myself it was still elevated at 144/90/92 At this point we went ahead and added hydralazine 25 mg twice daily continue other medications as is patient will do follow-up visit in a few weeks time to recheck

## 2020-07-20 ENCOUNTER — Telehealth: Payer: Self-pay

## 2020-07-20 MED ORDER — CLONIDINE HCL 0.1 MG PO TABS: 0.1000 mg | ORAL_TABLET | Freq: Two times a day (BID) | ORAL | 0 refills | Status: DC

## 2020-07-20 NOTE — Telephone Encounter (Signed)
Given her blood pressure problems before starting this medicine now that she is no longer able to be on hydralazine I would recommend-  #1 do not take hydralazine #2 may start clonidine 0.1 mg, 1 taken twice daily, #60, and this should help bring her blood pressure down continued the valsartan. Keep follow-up visit in March sooner if any problems

## 2020-07-20 NOTE — Telephone Encounter (Signed)
Pt was prescribed hydrALAZINE (APRESOLINE) 25 MG tablet  Pt has allergic reaction itching and hives   Pt call back [phone]

## 2020-07-20 NOTE — Telephone Encounter (Signed)
Patient advised per Dr Nicki Reaper: Given her blood pressure problems before starting this medicine now that she is no longer able to be on hydralazine I would recommend-   #1 do not take hydralazine #2 may start clonidine 0.1 mg, 1 taken twice daily, #60, and this should help bring her blood pressure down continued the valsartan. Keep follow-up visit in March sooner if any problems  Patient verbalized understanding.

## 2020-07-20 NOTE — Telephone Encounter (Signed)
Nurses Please touch base with the patient If she currently having problems with hives and itching?  Did she try anything for this?  Certainly stop the medicine.  Please listed as an allergy in her chart.  Once we find out more information we can decide if we need to utilize any type of prednisone.  Plus we can also decide what next medicine we will try for her blood pressure. Please gather this additional information and then let me know thank you

## 2020-07-20 NOTE — Telephone Encounter (Signed)
Prescription sent electronically to pharmacy. Left message to return call to notify patient. 

## 2020-07-20 NOTE — Telephone Encounter (Signed)
Patient states she was iching and had a few whelps on back of neck. She said she is currently fine with no whelps and only a slight itch at times- she did stop the medication.

## 2020-08-03 DIAGNOSIS — K529 Noninfective gastroenteritis and colitis, unspecified: Secondary | ICD-10-CM | POA: Diagnosis not present

## 2020-08-03 DIAGNOSIS — Z79899 Other long term (current) drug therapy: Secondary | ICD-10-CM | POA: Diagnosis not present

## 2020-08-04 LAB — CBC WITH DIFFERENTIAL/PLATELET
Absolute Monocytes: 543 cells/uL (ref 200–950)
Basophils Absolute: 27 cells/uL (ref 0–200)
Basophils Relative: 0.4 %
Eosinophils Absolute: 60 cells/uL (ref 15–500)
Eosinophils Relative: 0.9 %
HCT: 38.5 % (ref 35.0–45.0)
Hemoglobin: 12.7 g/dL (ref 11.7–15.5)
Lymphs Abs: 2318 cells/uL (ref 850–3900)
MCH: 29.6 pg (ref 27.0–33.0)
MCHC: 33 g/dL (ref 32.0–36.0)
MCV: 89.7 fL (ref 80.0–100.0)
MPV: 11.9 fL (ref 7.5–12.5)
Monocytes Relative: 8.1 %
Neutro Abs: 3752 cells/uL (ref 1500–7800)
Neutrophils Relative %: 56 %
Platelets: 292 10*3/uL (ref 140–400)
RBC: 4.29 10*6/uL (ref 3.80–5.10)
RDW: 12.5 % (ref 11.0–15.0)
Total Lymphocyte: 34.6 %
WBC: 6.7 10*3/uL (ref 3.8–10.8)

## 2020-08-04 LAB — IGA: Immunoglobulin A: 155 mg/dL (ref 70–320)

## 2020-08-04 LAB — TSH: TSH: 1.21 mIU/L (ref 0.40–4.50)

## 2020-08-04 LAB — TISSUE TRANSGLUTAMINASE, IGA: (tTG) Ab, IgA: <1 U/mL

## 2020-08-10 ENCOUNTER — Telehealth: Payer: Self-pay | Admitting: *Deleted

## 2020-08-10 NOTE — Telephone Encounter (Addendum)
FYI: Gardiner Coins NP for insurance stated patient's BP 190/100 today when she checked it. Patient is not having any symptoms and has an appt 08/13/20 for BP follow up.

## 2020-08-11 NOTE — Telephone Encounter (Signed)
Left message to return call 

## 2020-08-11 NOTE — Telephone Encounter (Signed)
Verify this with the patient If it is true I would recommend increasing clonidine 2 tablets in the morning 2 tablets in the evening continue all other medications Keep follow-up visit on the 17th Make sure that she does take her medications on the 17th.

## 2020-08-11 NOTE — Telephone Encounter (Signed)
Patient verified that her blood pressure was elevated at check but stated Mondays are not good for her. Patient advised per Dr Nicki Reaper:  Increase clonidine to 2 tablets in the morning 2 tablets in the evening continue all other medications Keep follow-up visit on the 17th Make sure that she does take her medications on the 17th. Patient verbalized understanding.

## 2020-08-13 ENCOUNTER — Ambulatory Visit (INDEPENDENT_AMBULATORY_CARE_PROVIDER_SITE_OTHER): Payer: Medicare Other | Admitting: Family Medicine

## 2020-08-13 ENCOUNTER — Encounter: Payer: Self-pay | Admitting: Family Medicine

## 2020-08-13 ENCOUNTER — Other Ambulatory Visit: Payer: Self-pay

## 2020-08-13 VITALS — BP 146/86 | HR 96 | Temp 97.9°F | Wt 214.6 lb

## 2020-08-13 DIAGNOSIS — I10 Essential (primary) hypertension: Secondary | ICD-10-CM

## 2020-08-13 MED ORDER — SERTRALINE HCL 100 MG PO TABS: ORAL_TABLET | ORAL | 1 refills | Status: DC

## 2020-08-13 MED ORDER — CLONIDINE HCL 0.1 MG PO TABS: ORAL_TABLET | ORAL | 4 refills | Status: DC

## 2020-08-13 NOTE — Progress Notes (Signed)
° °  Subjective:    Patient ID: Aimee Dixon, female    DOB: 1950/09/08, 69 y.o.   MRN: [id number]  HPI Pt here for recheck on blood pressure. Pt taking blood pressure twice a day at home. Pt is taking Clonidine 0.1 mg 2 tablets BID and Valsartan 160 mg once daily.  She is trying to watch her diet she is trying to stay active she denies any other health issues currently  Review of Systems Denies current chest tightness pressure pain shortness of breath    Objective:   Physical Exam  Lungs clear heart regular pulse normal BP elevated      Assessment & Plan:  Patient will continue clonidine 2 pills twice daily.  She will also continue valsartan. Recheck her again in a few weeks if it is still elevated consider diuretic with low-dose spironolactone versus doxazosin

## 2020-08-13 NOTE — Patient Instructions (Addendum)
Clonidine use 2 tablets twice daily Continue valsartan daily  Recheck here in 3 weeks  Give [place] update on BP within the next 2 weeks Bring BP cuff with you when you come back  Sertraline increase to 150 mg daily ( one and one half tablet)  Walking 20 to 30 minutes 4 times a week

## 2020-09-03 ENCOUNTER — Ambulatory Visit (INDEPENDENT_AMBULATORY_CARE_PROVIDER_SITE_OTHER): Payer: Medicare Other | Admitting: Family Medicine

## 2020-09-03 ENCOUNTER — Other Ambulatory Visit: Payer: Self-pay

## 2020-09-03 ENCOUNTER — Encounter: Payer: Self-pay | Admitting: Family Medicine

## 2020-09-03 VITALS — BP 172/96 | Temp 96.8°F | Wt 215.2 lb

## 2020-09-03 DIAGNOSIS — I10 Essential (primary) hypertension: Secondary | ICD-10-CM | POA: Diagnosis not present

## 2020-09-03 MED ORDER — CLONIDINE HCL 0.3 MG PO TABS: ORAL_TABLET | ORAL | 5 refills | Status: DC

## 2020-09-03 NOTE — Progress Notes (Signed)
   Subjective:    Patient ID: Aimee Dixon, female    DOB: June 08, 1950, 70 y.o.   MRN: [id number]  HPI Pt here for 3 week follow up on HTN. Pt has been checking blood pressure regular. No issues with blood pressure. Pt is taking all meds as directed.  Patient is taking medicine as directed Try to watch her diet Try and stay active  Review of Systems See above    Objective:   Physical Exam Vitals reviewed.  Constitutional:      General: She is not in acute distress. HENT:     Head: Normocephalic.  Cardiovascular:     Rate and Rhythm: Normal rate and regular rhythm.     Heart sounds: Normal heart sounds. No murmur heard.   Pulmonary:     Effort: Pulmonary effort is normal.     Breath sounds: Normal breath sounds.  Lymphadenopathy:     Cervical: No cervical adenopathy.  Neurological:     Mental Status: She is alert.  Psychiatric:        Behavior: Behavior normal.           Assessment & Plan:  Watch portions stay active try to lose weight Blood pressure subpar control increase clonidine 0.3 twice daily.  Follow-up within 6 weeks for recheck Consider nifedipine if ongoing troubles Also consider high blood pressure clinic if not improving over the next few months

## 2020-09-22 ENCOUNTER — Telehealth: Payer: Self-pay

## 2020-09-22 NOTE — Telephone Encounter (Signed)
Aimee Dixon dropped off blood pressure reading last Friday and she thinks her blood pressure was to low on what she turn in she cut her blood pressure meds in half and Sundays numbers look better. She is wanting to know what to do or to continue with meds in half.   Pt call back [phone]

## 2020-09-22 NOTE — Telephone Encounter (Signed)
I reviewed over her blood pressure readings  Please touch base with patient for clarification Did she cut the valsartan in half?  Or the clonidine in half?  Or both in half?  After cutting it in half has her blood pressure is stable looking good or did they go back up? More to follow after these clarifications thank you

## 2020-09-23 NOTE — Telephone Encounter (Signed)
Tried to contact patient; someone picked up but then hung up. Will try again .

## 2020-09-23 NOTE — Telephone Encounter (Signed)
Patient states she has cut the Clonidine in half and her BP last night was 131/79 in left arm

## 2020-09-24 NOTE — Telephone Encounter (Signed)
Pt contacted and verbalized understanding.  

## 2020-09-24 NOTE — Telephone Encounter (Signed)
She may continue to do so and we will do a follow-up visit in May as planned thank you

## 2020-10-07 ENCOUNTER — Other Ambulatory Visit: Payer: Self-pay | Admitting: Family Medicine

## 2020-10-12 DIAGNOSIS — I10 Essential (primary) hypertension: Secondary | ICD-10-CM | POA: Diagnosis not present

## 2020-10-12 DIAGNOSIS — R7301 Impaired fasting glucose: Secondary | ICD-10-CM | POA: Diagnosis not present

## 2020-10-12 DIAGNOSIS — E782 Mixed hyperlipidemia: Secondary | ICD-10-CM | POA: Diagnosis not present

## 2020-10-12 DIAGNOSIS — E876 Hypokalemia: Secondary | ICD-10-CM | POA: Diagnosis not present

## 2020-10-13 LAB — LIPID PANEL
Chol/HDL Ratio: 3.8 ratio (ref 0.0–4.4)
Cholesterol, Total: 176 mg/dL (ref 100–199)
HDL: 46 mg/dL (ref >39)
LDL Chol Calc (NIH): 109 mg/dL — ABNORMAL HIGH (ref 0–99)
Triglycerides: 114 mg/dL (ref 0–149)
VLDL Cholesterol Cal: 21 mg/dL (ref 5–40)

## 2020-10-13 LAB — BASIC METABOLIC PANEL
BUN/Creatinine Ratio: 14 (ref 12–28)
BUN: 10 mg/dL (ref 8–27)
CO2: 27 mmol/L (ref 20–29)
Calcium: 10 mg/dL (ref 8.7–10.3)
Chloride: 100 mmol/L (ref 96–106)
Creatinine, Ser: 0.74 mg/dL (ref 0.57–1.00)
Glucose: 121 mg/dL — ABNORMAL HIGH (ref 65–99)
Potassium: 3.4 mmol/L — ABNORMAL LOW (ref 3.5–5.2)
Sodium: 143 mmol/L (ref 134–144)
eGFR: 89 mL/min/{1.73_m2} (ref >59)

## 2020-10-13 LAB — HEMOGLOBIN A1C
Est. average glucose Bld gHb Est-mCnc: 140 mg/dL
Hgb A1c MFr Bld: 6.5 % — ABNORMAL HIGH (ref 4.8–5.6)

## 2020-10-19 ENCOUNTER — Other Ambulatory Visit: Payer: Self-pay

## 2020-10-19 ENCOUNTER — Ambulatory Visit (INDEPENDENT_AMBULATORY_CARE_PROVIDER_SITE_OTHER): Payer: Medicare Other | Admitting: Family Medicine

## 2020-10-19 ENCOUNTER — Encounter: Payer: Self-pay | Admitting: Family Medicine

## 2020-10-19 VITALS — BP 128/72 | HR 98 | Temp 97.5°F | Wt 211.4 lb

## 2020-10-19 DIAGNOSIS — I10 Essential (primary) hypertension: Secondary | ICD-10-CM

## 2020-10-19 DIAGNOSIS — E119 Type 2 diabetes mellitus without complications: Secondary | ICD-10-CM | POA: Diagnosis not present

## 2020-10-19 MED ORDER — AZELASTINE HCL 0.1 % NA SOLN: 2.0000 | Freq: Two times a day (BID) | NASAL | 12 refills | Status: AC

## 2020-10-19 NOTE — Progress Notes (Signed)
   Subjective:    Patient ID: Aimee Dixon, female    DOB: 1951-03-01, 70 y.o.   MRN: [id number]  HPI Pt here for 6 month follow up on blood pressure. Pt is checking blood pressure at home on occasion. No issues.   Patient overall feeling somewhat stressed by having to take care of her husband who has debilitating cervical myelopathy  Patient did reduce her clonidine to a half a tablet twice daily because her blood pressure had dropped to low Review of Systems Denies PND denies orthopnea    Objective:   Physical Exam Lungs clear heart regular pulse normal trace edema in the feet       Assessment & Plan:  1. HTN (hypertension), benign Blood pressure good control continue current measures she is currently doing a half of the Catapres twice daily - Hemoglobin E6L - Basic Metabolic Panel (BMET)  2. Type 2 diabetes mellitus without complication, without long-term current use of insulin (HCC) A1c currently 6.5 not on medication and she will work hard on diet she will relook at the A1c again in the few months if it is still elevated restart metformin Check A1c watch diet - Hemoglobin J4G - Basic Metabolic Panel (BMET) Follow-up by fall time labs before that visit

## 2020-10-20 MED ORDER — CLONIDINE HCL 0.3 MG PO TABS: ORAL_TABLET | ORAL | 5 refills | Status: DC

## 2020-10-20 NOTE — Progress Notes (Signed)
10/20/20-updated directions in [place].

## 2020-10-20 NOTE — Addendum Note (Signed)
Addended by: Vicente Males on: 10/20/2020 09:21 AM   Modules accepted: Orders

## 2020-10-27 ENCOUNTER — Ambulatory Visit: Payer: Medicare Other | Admitting: Gastroenterology

## 2020-11-16 ENCOUNTER — Other Ambulatory Visit: Payer: Self-pay | Admitting: Family Medicine

## 2020-11-16 MED ORDER — CLONIDINE HCL 0.3 MG PO TABS: ORAL_TABLET | ORAL | 5 refills | Status: DC

## 2020-11-16 NOTE — Telephone Encounter (Signed)
pls call pt to see if she requested this, since listed as allergy on chart. Thx. Dr. Lovena Le

## 2020-11-21 ENCOUNTER — Other Ambulatory Visit: Payer: Self-pay | Admitting: Gastroenterology

## 2020-11-25 ENCOUNTER — Other Ambulatory Visit (HOSPITAL_COMMUNITY): Payer: Self-pay | Admitting: Family Medicine

## 2020-11-25 DIAGNOSIS — Z1231 Encounter for screening mammogram for malignant neoplasm of breast: Secondary | ICD-10-CM

## 2020-12-08 ENCOUNTER — Encounter: Payer: Self-pay | Admitting: Internal Medicine

## 2020-12-08 ENCOUNTER — Other Ambulatory Visit: Payer: Self-pay

## 2020-12-08 ENCOUNTER — Ambulatory Visit: Payer: Medicare Other | Admitting: Internal Medicine

## 2020-12-08 VITALS — BP 156/78 | HR 84 | Temp 97.8°F | Ht 62.5 in | Wt 212.6 lb

## 2020-12-08 DIAGNOSIS — K219 Gastro-esophageal reflux disease without esophagitis: Secondary | ICD-10-CM | POA: Diagnosis not present

## 2020-12-08 DIAGNOSIS — K580 Irritable bowel syndrome with diarrhea: Secondary | ICD-10-CM

## 2020-12-08 NOTE — Progress Notes (Signed)
Primary Care Physician:  Kathyrn Drown, MD Primary Gastroenterologist:  Dr. Gala Romney  Pre-Procedure History & Physical: HPI:  Aimee Dixon is a 70 y.o. female here for follow-up of GERD and IBS-D.  Bentyl works well when she remembers to take it she often does not remember.  Eats midday and early evening typically 2 meals a day.  When she eats out/fast food intake associated with the flare in symptoms.  History of colonic adenomas removed; due surveillance colonoscopy 2026.  GERD well controlled on Protonix 40 mg daily.  No dysphagia. Bloating much much better on probiotic daily. Did not try Xifaxan.  Too expensive.  Celiac panel, thyroid panel negative.  Past Medical History:  Diagnosis Date   Hyperlipidemia    Hypertension    IDA (iron deficiency anemia)    Tubular adenoma of colon     Past Surgical History:  Procedure Laterality Date   ABDOMINAL HYSTERECTOMY     BIOPSY  03/26/2020   Procedure: BIOPSY;  Surgeon: Daneil Dolin, MD;  [facility]: [facility];  Service: Endoscopy;;   COLONOSCOPY  06/30/2003   WCB:JSEG papilla/Otherwise,normal rectum/Pedunculated polyp at middescending colon/Smaller pedunculated polyp at hepatic flexure, cold snared   COLONOSCOPY   08/07/2006   BTD:VVOHYWV internal hemorrhoids, otherwise normal rectum/Diminutive ascending colon adenomatous polyps   COLONOSCOPY  08/03/2009   RMR: tubular adenomas removed from hepatic flexure & desc colon, internal hemorrhoids and anal papilla otherwise norma;   COLONOSCOPY N/A 10/03/2012   Procedure: COLONOSCOPY;  Surgeon: Daneil Dolin, MD;  Location: [facility];  Service: Endoscopy;  Laterality: N/A;  2:15   COLONOSCOPY N/A 01/17/2018   Three 4-7 mm polyps in descending colon and at hepatic flexure, s/p removal. Tubular adenomas. Surveillance in 2022.    COLONOSCOPY WITH PROPOFOL N/A 03/26/2020    one 10 mm polyp (tubular adenoma), otherwise normal, s/p segmental biopsies (benign)   POLYPECTOMY  01/17/2018    Procedure: POLYPECTOMY;  Surgeon: Daneil Dolin, MD;  [facility]: [facility];  Service: Endoscopy;;  hepatic flexure x2;descending colon   POLYPECTOMY  03/26/2020   Procedure: POLYPECTOMY;  Surgeon: Daneil Dolin, MD;  Location: [facility];  Service: Endoscopy;;    Prior to Admission medications   Medication Sig Start Date End Date Taking? Authorizing Provider  acetaminophen (TYLENOL) 500 MG tablet Take 1,000 mg by mouth as needed for mild pain or moderate pain.   Yes [provider]  albuterol (VENTOLIN HFA) 108 (90 Base) MCG/ACT inhaler Inhale 2 puffs into the lungs every 4 (four) hours as needed for wheezing. 04/16/19  Yes Kathyrn Drown, MD  atorvastatin (LIPITOR) 40 MG tablet Take 1 tablet (40 mg total) by mouth daily. 07/02/20  Yes Luking, Elayne Snare, MD  azelastine (ASTELIN) 0.1 % nasal spray Place 2 sprays into both nostrils 2 (two) times daily. 10/19/20  Yes Kathyrn Drown, MD  cloNIDine (CATAPRES) 0.3 MG tablet Take 1/2 tablet po bid 11/16/20  Yes Luking, Scott A, MD  dicyclomine (BENTYL) 10 MG capsule TAKE 1 CAPSULE(10 MG) BY MOUTH FOUR TIMES DAILY BEFORE MEALS AND AT BEDTIME 11/25/20  Yes Mahala Menghini, PA-C  fexofenadine (ALLEGRA) 180 MG tablet Take 180 mg by mouth daily.   Yes [provider]  guaiFENesin (MUCINEX) 600 MG 12 hr tablet Take 600 mg by mouth daily.    Yes [provider]  Multiple Vitamins-Minerals (MULTIVITAMIN WITH MINERALS) tablet Take 1 tablet by mouth daily.   Yes [provider]  pantoprazole (PROTONIX) 40 MG tablet TAKE 1 TABLET BY MOUTH DAILY Patient taking differently: Take 40 mg by mouth daily. 03/19/20  Yes Annitta Needs, NP  sertraline (ZOLOFT) 100 MG tablet 1 and 1/2 tablet daily 08/13/20  Yes Luking, Elayne Snare, MD  valsartan (DIOVAN) 160 MG tablet Take 1 tablet (160 mg total) by mouth daily. 07/02/20  Yes Kathyrn Drown, MD    Allergies as of 12/08/2020 - Review Complete 12/08/2020  Allergen Reaction Noted    Apresoline [hydralazine] Hives and Itching 07/20/2020   Norvasc [amlodipine besylate] Other (See Comments) 10/05/2012    Family History  Problem Relation Age of Onset   Lung cancer Father    Breast cancer Mother 3   Colon cancer Neg Hx     Social History   Socioeconomic History   Marital status: Married    Spouse name: Not on file   Number of children: 1   Years of education: Not on file   Highest education level: Not on file  Occupational History   Occupation: Interior and spatial designer Room    Employer: [employer]  Tobacco Use   Smoking status: Never   Smokeless tobacco: Never  Vaping Use   Vaping Use: Never used  Substance and Sexual Activity   Alcohol use: No   Drug use: No   Sexual activity: Yes    Birth control/protection: Surgical  Other Topics Concern   Not on file  Social History Narrative   Lives w/ husband   Social Determinants of Health   Financial Resource Strain: Not on file  Food Insecurity: Not on file  Transportation Needs: Not on file  Physical Activity: Not on file  Stress: Not on file  Social Connections: Not on file  Intimate Partner Violence: Not on file    Review of Systems: See HPI, otherwise negative ROS  Physical Exam: BP (!) 156/78   Pulse 84   Temp 97.8 F (36.6 C) (Temporal)   Ht 5' 2.5" (1.588 m)   Wt 212 lb 9.6 oz (96.4 kg)   BMI 38.27 kg/m  General:   Alert,  Well-developed, well-nourished, pleasant and cooperative in NAD Abdomen: Non-distended, normal bowel sounds.  Soft and nontender without appreciable mass or hepatosplenomegaly.  Pulses:  Normal pulses noted. Extremities:  Without clubbing or edema.  Impression/Plan: 70 year old lady with GERD and IBS-D.  Progress as outlined above.  Reflux well controlled on Protonix 40 mg daily. Bloating significantly improved with generic probiotic  IBS-D responsive to antispasmodic therapy when she takes it as directed.  Recommendations:  Continue taking Protonix 40 mg each  morning  Have your smart phone alarm set to twice a day (for instance, 11-15 a.m. and 4:15 PM) to prompt you to take Bentyl or dicyclomine 10 mg before meals  Continue taking your probiotic daily.  Plan for colonoscopy 2026.  Office visit here in 6 months and as needed.  Notice: This dictation was prepared with Dragon dictation along with smaller phrase technology. Any transcriptional errors that result from this process are unintentional and may not be corrected upon review.

## 2020-12-08 NOTE — Patient Instructions (Signed)
Good to see you again today!  Continue taking Protonix 40 mg each morning  Have your smart phone alarm set to twice a day (for instance, 11-15 a.m. and 4:15 PM) to prompt you to take Bentyl or dicyclomine 10 mg before meals  Continue taking your probiotic daily.  Plan for colonoscopy 2026.  Office visit here in 6 months and as needed.

## 2020-12-10 ENCOUNTER — Ambulatory Visit (HOSPITAL_COMMUNITY)
Admission: RE | Admit: 2020-12-10 | Discharge: 2020-12-10 | Disposition: A | Discharge status: Home / Self Care | Payer: Medicare Other | Source: Ambulatory Visit | Attending: Family Medicine | Admitting: Family Medicine

## 2020-12-10 ENCOUNTER — Other Ambulatory Visit: Payer: Self-pay

## 2020-12-10 DIAGNOSIS — Z1231 Encounter for screening mammogram for malignant neoplasm of breast: Secondary | ICD-10-CM | POA: Diagnosis not present

## 2021-01-02 ENCOUNTER — Other Ambulatory Visit: Payer: Self-pay | Admitting: Family Medicine

## 2021-02-08 DIAGNOSIS — I10 Essential (primary) hypertension: Secondary | ICD-10-CM | POA: Diagnosis not present

## 2021-02-08 DIAGNOSIS — E119 Type 2 diabetes mellitus without complications: Secondary | ICD-10-CM | POA: Diagnosis not present

## 2021-02-09 ENCOUNTER — Other Ambulatory Visit: Payer: Self-pay | Admitting: Family Medicine

## 2021-02-09 DIAGNOSIS — E876 Hypokalemia: Secondary | ICD-10-CM

## 2021-02-09 LAB — BASIC METABOLIC PANEL
BUN/Creatinine Ratio: 12 (ref 12–28)
BUN: 8 mg/dL (ref 8–27)
CO2: 27 mmol/L (ref 20–29)
Calcium: 10 mg/dL (ref 8.7–10.3)
Chloride: 101 mmol/L (ref 96–106)
Creatinine, Ser: 0.68 mg/dL (ref 0.57–1.00)
Glucose: 113 mg/dL — ABNORMAL HIGH (ref 65–99)
Potassium: 3.2 mmol/L — ABNORMAL LOW (ref 3.5–5.2)
Sodium: 144 mmol/L (ref 134–144)
eGFR: 95 mL/min/{1.73_m2} (ref >59)

## 2021-02-09 LAB — HEMOGLOBIN A1C
Est. average glucose Bld gHb Est-mCnc: 134 mg/dL
Hgb A1c MFr Bld: 6.3 % — ABNORMAL HIGH (ref 4.8–5.6)

## 2021-02-09 MED ORDER — POTASSIUM CHLORIDE CRYS ER 10 MEQ PO TBCR: EXTENDED_RELEASE_TABLET | ORAL | 1 refills | Status: DC

## 2021-02-11 ENCOUNTER — Other Ambulatory Visit: Payer: Self-pay | Admitting: Family Medicine

## 2021-03-08 DIAGNOSIS — E876 Hypokalemia: Secondary | ICD-10-CM | POA: Diagnosis not present

## 2021-03-09 ENCOUNTER — Ambulatory Visit (INDEPENDENT_AMBULATORY_CARE_PROVIDER_SITE_OTHER): Payer: Medicare Other

## 2021-03-09 ENCOUNTER — Other Ambulatory Visit: Payer: Self-pay

## 2021-03-09 ENCOUNTER — Telehealth: Payer: Self-pay

## 2021-03-09 VITALS — HR 88 | Temp 97.9°F | Ht 66.0 in

## 2021-03-09 DIAGNOSIS — Z0000 Encounter for general adult medical examination without abnormal findings: Secondary | ICD-10-CM | POA: Diagnosis not present

## 2021-03-09 LAB — BASIC METABOLIC PANEL
BUN/Creatinine Ratio: 11 — ABNORMAL LOW (ref 12–28)
BUN: 8 mg/dL (ref 8–27)
CO2: 26 mmol/L (ref 20–29)
Calcium: 10.4 mg/dL — ABNORMAL HIGH (ref 8.7–10.3)
Chloride: 102 mmol/L (ref 96–106)
Creatinine, Ser: 0.72 mg/dL (ref 0.57–1.00)
Glucose: 117 mg/dL — ABNORMAL HIGH (ref 70–99)
Potassium: 3.8 mmol/L (ref 3.5–5.2)
Sodium: 145 mmol/L — ABNORMAL HIGH (ref 134–144)
eGFR: 92 mL/min/{1.73_m2} (ref >59)

## 2021-03-09 NOTE — Patient Instructions (Signed)
[name] , Thank you for taking time to come for your Medicare Wellness Visit. I appreciate your ongoing commitment to your health goals. Please review the following plan we discussed and let me know if I can assist you in the future.   Screening recommendations/referrals: Colonoscopy: Done 02/2020 Repeat in 10 years  Mammogram: Done 12/10/2020 Repeat annually  Bone Density: Done 2018 Repeat every 2 years  Recommended yearly ophthalmology/optometry visit for glaucoma screening and checkup Recommended yearly dental visit for hygiene and checkup  Vaccinations: Influenza vaccine: Done 03/19/2020 Pneumococcal vaccine: Done 02/26/2019 Tdap vaccine: Done 04/01/2012 Repeat in 10 years  Shingles vaccine: Done Shingrix discussed. Please contact your pharmacy for coverage information.     Covid-19:Done 08/12/2019, 09/12/2019  Advanced directives: Advance directive discussed with you today. Even though you declined this today, please call our office should you change your mind, and we can give you the proper paperwork for you to fill out.   Conditions/risks identified: Aim for 30 minutes of exercise or walking each day, drink 6-8 glasses of water and eat lots of fruits and vegetables.   Next appointment: Follow up in one year for your annual wellness visit 2023    Preventive Care 65 Years and Older, Female Preventive care refers to lifestyle choices and visits with your health care provider that can promote health and wellness. What does preventive care include? A yearly physical exam. This is also called an annual well check. Dental exams once or twice a year. Routine eye exams. Ask your health care provider how often you should have your eyes checked. Personal lifestyle choices, including: Daily care of your teeth and gums. Regular physical activity. Eating a healthy diet. Avoiding tobacco and drug use. Limiting alcohol use. Practicing safe sex. Taking low-dose aspirin every  day. Taking vitamin and mineral supplements as recommended by your health care provider. What happens during an annual well check? The services and screenings done by your health care provider during your annual well check will depend on your age, overall health, lifestyle risk factors, and family history of disease. Counseling  Your health care provider may ask you questions about your: Alcohol use. Tobacco use. Drug use. Emotional well-being. Home and relationship well-being. Sexual activity. Eating habits. History of falls. Memory and ability to understand (cognition). Work and work Statistician. Reproductive health. Screening  You may have the following tests or measurements: Height, weight, and BMI. Blood pressure. Lipid and cholesterol levels. These may be checked every 5 years, or more frequently if you are over 91 years old. Skin check. Lung cancer screening. You may have this screening every year starting at age 56 if you have a 30-pack-year history of smoking and currently smoke or have quit within the past 15 years. Fecal occult blood test (FOBT) of the stool. You may have this test every year starting at age 54. Flexible sigmoidoscopy or colonoscopy. You may have a sigmoidoscopy every 5 years or a colonoscopy every 10 years starting at age 31. Hepatitis C blood test. Hepatitis B blood test. Sexually transmitted disease (STD) testing. Diabetes screening. This is done by checking your blood sugar (glucose) after you have not eaten for a while (fasting). You may have this done every 1-3 years. Bone density scan. This is done to screen for osteoporosis. You may have this done starting at age 38. Mammogram. This may be done every 1-2 years. Talk to your health care provider about how often you should have regular mammograms. Talk with your health care provider  about your test results, treatment options, and if necessary, the need for more tests. Vaccines  Your health care  provider may recommend certain vaccines, such as: Influenza vaccine. This is recommended every year. Tetanus, diphtheria, and acellular pertussis (Tdap, Td) vaccine. You may need a Td booster every 10 years. Zoster vaccine. You may need this after age 12. Pneumococcal 13-valent conjugate (PCV13) vaccine. One dose is recommended after age 66. Pneumococcal polysaccharide (PPSV23) vaccine. One dose is recommended after age 74. Talk to your health care provider about which screenings and vaccines you need and how often you need them. This information is not intended to replace advice given to you by your health care provider. Make sure you discuss any questions you have with your health care provider. Document Released: 06/12/2015 Document Revised: 02/03/2016 Document Reviewed: 03/17/2015 Elsevier Interactive Patient Education  2017 [place] Prevention in the Home Falls can cause injuries. They can happen to people of all ages. There are many things you can do to make your home safe and to help prevent falls. What can I do on the outside of my home? Regularly fix the edges of walkways and driveways and fix any cracks. Remove anything that might make you trip as you walk through a door, such as a raised step or threshold. Trim any bushes or trees on the path to your home. Use bright outdoor lighting. Clear any walking paths of anything that might make someone trip, such as rocks or tools. Regularly check to see if handrails are loose or broken. Make sure that both sides of any steps have handrails. Any raised decks and porches should have guardrails on the edges. Have any leaves, snow, or ice cleared regularly. Use sand or salt on walking paths during winter. Clean up any spills in your garage right away. This includes oil or grease spills. What can I do in the bathroom? Use night lights. Install grab bars by the toilet and in the tub and shower. Do not use towel bars as grab  bars. Use non-skid mats or decals in the tub or shower. If you need to sit down in the shower, use a plastic, non-slip stool. Keep the floor dry. Clean up any water that spills on the floor as soon as it happens. Remove soap buildup in the tub or shower regularly. Attach bath mats securely with double-sided non-slip rug tape. Do not have throw rugs and other things on the floor that can make you trip. What can I do in the bedroom? Use night lights. Make sure that you have a light by your bed that is easy to reach. Do not use any sheets or blankets that are too big for your bed. They should not hang down onto the floor. Have a firm chair that has side arms. You can use this for support while you get dressed. Do not have throw rugs and other things on the floor that can make you trip. What can I do in the kitchen? Clean up any spills right away. Avoid walking on wet floors. Keep items that you use a lot in easy-to-reach places. If you need to reach something above you, use a strong step stool that has a grab bar. Keep electrical cords out of the way. Do not use floor polish or wax that makes floors slippery. If you must use wax, use non-skid floor wax. Do not have throw rugs and other things on the floor that can make you trip. What can I do  with my stairs? Do not leave any items on the stairs. Make sure that there are handrails on both sides of the stairs and use them. Fix handrails that are broken or loose. Make sure that handrails are as long as the stairways. Check any carpeting to make sure that it is firmly attached to the stairs. Fix any carpet that is loose or worn. Avoid having throw rugs at the top or bottom of the stairs. If you do have throw rugs, attach them to the floor with carpet tape. Make sure that you have a light switch at the top of the stairs and the bottom of the stairs. If you do not have them, ask someone to add them for you. What else can I do to help prevent  falls? Wear shoes that: Do not have high heels. Have rubber bottoms. Are comfortable and fit you well. Are closed at the toe. Do not wear sandals. If you use a stepladder: Make sure that it is fully opened. Do not climb a closed stepladder. Make sure that both sides of the stepladder are locked into place. Ask someone to hold it for you, if possible. Clearly mark and make sure that you can see: Any grab bars or handrails. First and last steps. Where the edge of each step is. Use tools that help you move around (mobility aids) if they are needed. These include: Canes. Walkers. Scooters. Crutches. Turn on the lights when you go into a dark area. Replace any light bulbs as soon as they burn out. Set up your furniture so you have a clear path. Avoid moving your furniture around. If any of your floors are uneven, fix them. If there are any pets around you, be aware of where they are. Review your medicines with your doctor. Some medicines can make you feel dizzy. This can increase your chance of falling. Ask your doctor what other things that you can do to help prevent falls. This information is not intended to replace advice given to you by your health care provider. Make sure you discuss any questions you have with your health care provider. Document Released: 03/12/2009 Document Revised: 10/22/2015 Document Reviewed: 06/20/2014 Elsevier Interactive Patient Education  2017 [employer].

## 2021-03-09 NOTE — Progress Notes (Signed)
Subjective:   Aimee Dixon is a 70 y.o. female who presents for an Initial Medicare Annual Wellness Visit.  Review of Systems     Cardiac Risk Factors include: advanced age (>68men, >89 women);diabetes mellitus;hypertension;dyslipidemia;sedentary lifestyle;obesity (BMI >30kg/m2)     Objective:    Today's Vitals   03/09/21 1450  Pulse: 88  Temp: 97.9 F (36.6 C)  SpO2: 95%  Height: 5\' 6"  (1.676 m)   Body mass index is 34.31 kg/m.  Advanced Directives 03/09/2021 03/26/2020 01/17/2018  Does Patient Have a Medical Advance Directive? No No No  Would patient like information on creating a medical advance directive? No - Patient declined No - Patient declined No - Patient declined    Current Medications (verified) Outpatient Encounter Medications as of 03/09/2021  Medication Sig   acetaminophen (TYLENOL) 500 MG tablet Take 1,000 mg by mouth as needed for mild pain or moderate pain.   albuterol (VENTOLIN HFA) 108 (90 Base) MCG/ACT inhaler Inhale 2 puffs into the lungs every 4 (four) hours as needed for wheezing.   atorvastatin (LIPITOR) 40 MG tablet TAKE 1 TABLET(40 MG) BY MOUTH DAILY   azelastine (ASTELIN) 0.1 % nasal spray Place 2 sprays into both nostrils 2 (two) times daily.   dicyclomine (BENTYL) 10 MG capsule TAKE 1 CAPSULE(10 MG) BY MOUTH FOUR TIMES DAILY BEFORE MEALS AND AT BEDTIME   fexofenadine (ALLEGRA) 180 MG tablet Take 180 mg by mouth daily.   guaiFENesin (MUCINEX) 600 MG 12 hr tablet Take 600 mg by mouth daily.    Multiple Vitamins-Minerals (MULTIVITAMIN WITH MINERALS) tablet Take 1 tablet by mouth daily.   pantoprazole (PROTONIX) 40 MG tablet TAKE 1 TABLET BY MOUTH DAILY (Patient taking differently: Take 40 mg by mouth daily.)   potassium chloride (KLOR-CON) 10 MEQ tablet Take one tablet po daily   sertraline (ZOLOFT) 100 MG tablet TAKE 1 AND 1/2 TABLETS BY MOUTH DAILY   valsartan (DIOVAN) 160 MG tablet TAKE 1 TABLET(160 MG) BY MOUTH DAILY   [DISCONTINUED]  cloNIDine (CATAPRES) 0.1 MG tablet Take 0.2 mg by mouth 2 (two) times daily.   [DISCONTINUED] cloNIDine (CATAPRES) 0.3 MG tablet Take 1/2 tablet po bid   No facility-administered encounter medications on file as of 03/09/2021.    Allergies (verified) Apresoline [hydralazine] and Norvasc [amlodipine besylate]   History: Past Medical History:  Diagnosis Date   Hyperlipidemia    Hypertension    IDA (iron deficiency anemia)    Tubular adenoma of colon    Past Surgical History:  Procedure Laterality Date   ABDOMINAL HYSTERECTOMY     BIOPSY  03/26/2020   Procedure: BIOPSY;  Surgeon: Daneil Dolin, MD;  [facility];  Service: Endoscopy;;   COLONOSCOPY  06/30/2003   BBC:WUGQ papilla/Otherwise,normal rectum/Pedunculated polyp at middescending colon/Smaller pedunculated polyp at hepatic flexure, cold snared   COLONOSCOPY   08/07/2006   BVQ:[id number] internal hemorrhoids, otherwise normal rectum/Diminutive ascending colon adenomatous polyps   COLONOSCOPY  08/03/2009   RMR: tubular adenomas removed from hepatic flexure & desc colon, internal hemorrhoids and anal papilla otherwise norma;   COLONOSCOPY N/A 10/03/2012   Procedure: COLONOSCOPY;  Surgeon: Daneil Dolin, MD;  [facility];  Service: Endoscopy;  Laterality: N/A;  2:15   COLONOSCOPY N/A 01/17/2018   Three 4-7 mm polyps in descending colon and at hepatic flexure, s/p removal. Tubular adenomas. Surveillance in 2022.    COLONOSCOPY WITH PROPOFOL N/A 03/26/2020    one 10 mm polyp (tubular adenoma), otherwise normal, s/p segmental  biopsies (benign)   POLYPECTOMY  01/17/2018   Procedure: POLYPECTOMY;  Surgeon: Daneil Dolin, MD;  [facility];  Service: Endoscopy;;  hepatic flexure x2;descending colon   POLYPECTOMY  03/26/2020   Procedure: POLYPECTOMY;  Surgeon: Daneil Dolin, MD;  [facility];  Service: Endoscopy;;   Family History  Problem Relation Age of Onset   Lung cancer Father     Breast cancer Mother 53   Colon cancer Neg Hx    Social History   Socioeconomic History   Marital status: Married    Spouse name: Wiliam   Number of children: 1   Years of education: Not on file   Highest education level: Not on file  Occupational History   Occupation: Interior and spatial designer Room    Employer: [employer]  Tobacco Use   Smoking status: Never   Smokeless tobacco: Never  Vaping Use   Vaping Use: Never used  Substance and Sexual Activity   Alcohol use: No   Drug use: No   Sexual activity: Yes    Birth control/protection: Surgical  Other Topics Concern   Not on file  Social History Narrative   Lives w/ husband. 1 daughter, 2 stepchildren. 5 grandchildren   Social Determinants of Radio broadcast assistant Strain: Low Risk    Difficulty of Paying Living Expenses: Not hard at all  Food Insecurity: No Food Insecurity   Worried About Charity fundraiser in the Last Year: Never true   Arboriculturist in the Last Year: Never true  Transportation Needs: No Transportation Needs   Lack of Transportation (Medical): No   Lack of Transportation (Non-Medical): No  Physical Activity: Inactive   Days of Exercise per Week: 0 days   Minutes of Exercise per Session: 0 min  Stress: No Stress Concern Present   Feeling of Stress : Only a little  Social Connections: Engineer, building services of Communication with Friends and Family: More than three times a week   Frequency of Social Gatherings with Friends and Family: More than three times a week   Attends Religious Services: More than 4 times per year   Active Member of [employer] or Organizations: Yes   Attends Music therapist: More than 4 times per year   Marital Status: Married    Tobacco Counseling Counseling given: Not Answered   Clinical Intake:  Pre-visit preparation completed: Yes  Pain : No/denies pain     Nutritional Risks: None Diabetes: No  How often do you need to have someone help you  when you read instructions, pamphlets, or other written materials from your doctor or pharmacy?: 1 - Never  Diabetic?no-prediabetes per pt. Does not check blood sugars.  Interpreter Needed?: No  Information entered by :: MJ Jaxan Michel, LPN   Activities of Daily Living In your present state of health, do you have any difficulty performing the following activities: 03/09/2021  Hearing? N  Vision? N  Difficulty concentrating or making decisions? N  Walking or climbing stairs? N  Dressing or bathing? N  Doing errands, shopping? N  Preparing Food and eating ? N  Using the Toilet? N  In the past six months, have you accidently leaked urine? N  Do you have problems with loss of bowel control? N  Managing your Medications? N  Managing your Finances? N  Housekeeping or managing your Housekeeping? N  Some recent data might be hidden    Patient Care Team: Kathyrn Drown,  MD as PCP - General (Family Medicine) Rourk, Cristopher Estimable, MD as Consulting Physician (Gastroenterology)  Indicate any recent Medical Services you may have received from other than [facility] providers in the past year (date may be approximate).     Assessment:   This is a routine wellness examination for K[place].  Hearing/Vision screen Hearing Screening - Comments:: No hearing issue.  Vision Screening - Comments:: Glasses. [facility]. Overdue  Dietary issues and exercise activities discussed: Current Exercise Habits: The patient does not participate in regular exercise at present, Exercise limited by: cardiac condition(s)   Goals Addressed             This Visit's Progress    Exercise 3x per week (30 min per time)         Depression Screen PHQ 2/9 Scores 03/09/2021 09/03/2020 08/13/2020 07/02/2020 04/20/2020 12/18/2019 12/18/2019  PHQ - 2 Score 2 0 2 0 3 1 1   PHQ- 9 Score 3 - 4 - 6 3 -    Fall Risk Fall Risk  03/09/2021 10/19/2020 09/03/2020 07/02/2020 04/16/2019  Falls in the past year? 0 0 0 0 0  Number falls in past  yr: 0 0 0 0 -  Injury with Fall? 0 0 0 0 -  Risk for fall due to : Impaired vision No Fall Risks No Fall Risks - -  Follow up Falls prevention discussed Falls evaluation completed Falls evaluation completed Falls evaluation completed Falls evaluation completed    [place]:  Any stairs in or around the home? No  If so, are there any without handrails? No  Home free of loose throw rugs in walkways, pet beds, electrical cords, etc? Yes  Adequate lighting in your home to reduce risk of falls? Yes   ASSISTIVE DEVICES UTILIZED TO PREVENT FALLS:  Life alert? No  Use of a cane, walker or w/c? No  Grab bars in the bathroom? No  Shower chair or bench in shower? No  Elevated toilet seat or a handicapped toilet? Yes   TIMED UP AND GO:  Was the test performed? Yes .  Length of time to ambulate 10 feet: 10 sec.   Gait steady and fast without use of assistive device  Cognitive Function:     6CIT Screen 03/09/2021  What Year? 0 points  What month? 0 points  What time? 0 points  Count back from 20 0 points  Months in reverse 0 points  Repeat phrase 0 points  Total Score 0    Immunizations Immunization History  Administered Date(s) Administered   Fluad Quad(high Dose 65+) 04/20/2020   Influenza, High Dose Seasonal PF 04/04/2019   Influenza-Unspecified 03/31/2011, 12/28/2012, 04/02/2019   Moderna Sars-Covid-2 Vaccination 07/07/2019, 08/07/2019, 05/04/2020   Pneumococcal Polysaccharide-23 12/18/2019   Td 07/01/2011   Zoster Recombinat (Shingrix) 02/11/2020   Zoster, Live 04/09/2013    TDAP status: Due, Education has been provided regarding the importance of this vaccine. Advised may receive this vaccine at local pharmacy or Health Dept. Aware to provide a copy of the vaccination record if obtained from local pharmacy or Health Dept. Verbalized acceptance and understanding.  Flu Vaccine status: Due, Education has been provided regarding the  importance of this vaccine. Advised may receive this vaccine at local pharmacy or Health Dept. Aware to provide a copy of the vaccination record if obtained from local pharmacy or Health Dept. Verbalized acceptance and understanding.  Pneumococcal vaccine status: Due, Education has been provided regarding the importance of this vaccine.  Advised may receive this vaccine at local pharmacy or Health Dept. Aware to provide a copy of the vaccination record if obtained from local pharmacy or Health Dept. Verbalized acceptance and understanding.  Covid-19 vaccine status: Information provided on how to obtain vaccines.   Qualifies for Shingles Vaccine? Yes   Zostavax completed Yes   Shingrix Completed?: No.    Education has been provided regarding the importance of this vaccine. Patient has been advised to call insurance company to determine out of pocket expense if they have not yet received this vaccine. Advised may also receive vaccine at local pharmacy or Health Dept. Verbalized acceptance and understanding.  Screening Tests Health Maintenance  Topic Date Due   Zoster Vaccines- Shingrix (2 of 2) 04/07/2020   COVID-19 Vaccine (4 - Booster for Moderna series) 07/27/2020   INFLUENZA VACCINE  12/28/2020   TETANUS/TDAP  06/30/2021   MAMMOGRAM  12/11/2022   COLONOSCOPY (Pts 45-5yrs Insurance coverage will need to be confirmed)  03/27/2023   DEXA SCAN  Completed   Hepatitis C Screening  Completed   HPV VACCINES  Aged Out    Health Maintenance  Health Maintenance Due  Topic Date Due   Zoster Vaccines- Shingrix (2 of 2) 04/07/2020   COVID-19 Vaccine (4 - Booster for Moderna series) 07/27/2020   INFLUENZA VACCINE  12/28/2020    Colorectal cancer screening: Type of screening: Colonoscopy. Completed 03/26/2020. Repeat every 10 years  Mammogram status: Completed 12/10/20. Repeat every year  Bone Density status: Ordered Pt would like to wait on scheduling due to caring for her mother. Pt provided  with contact info and advised to call to schedule appt.  Lung Cancer Screening: (Low Dose CT Chest recommended if Age 35-80 years, 30 pack-year currently smoking OR have quit w/in 15years.) does not qualify.    Additional Screening:  Hepatitis C Screening: does qualify; Completed 06/23/2015  Vision Screening: Recommended annual ophthalmology exams for early detection of glaucoma and other disorders of the eye. Is the patient up to date with their annual eye exam?  No  Who is the provider or what is the name of the office in which the patient attends annual eye exams? [facility] If pt is not established with a provider, would they like to be referred to a provider to establish care? No .   Dental Screening: Recommended annual dental exams for proper oral hygiene  Community Resource Referral / Chronic Care Management: CRR required this visit?  No   CCM required this visit?  No      Plan:     I have personally reviewed and noted the following in the patient's chart:   Medical and social history Use of alcohol, tobacco or illicit drugs  Current medications and supplements including opioid prescriptions. Patient is not currently taking opioid prescriptions. Functional ability and status Nutritional status Physical activity Advanced directives List of other physicians Hospitalizations, surgeries, and ER visits in previous 12 months Vitals Screenings to include cognitive, depression, and falls Referrals and appointments  In addition, I have reviewed and discussed with patient certain preventive protocols, quality metrics, and best practice recommendations. A written personalized care plan for preventive services as well as general preventive health recommendations were provided to patient.     Chriss Driver, LPN   [id number]/[id number]   Nurse Notes: Pt states she is doing okay. Currently helping care for mother, who is on Hospice care. Declines scheduling Dexa at this time. Up to  date on colonoscopy and mammogram.

## 2021-03-09 NOTE — Telephone Encounter (Signed)
Pt here for AWV. Requests refill on Zoloft and Clonidine. Thank you!

## 2021-03-10 ENCOUNTER — Other Ambulatory Visit: Payer: Self-pay | Admitting: Family Medicine

## 2021-03-10 MED ORDER — CLONIDINE HCL 0.3 MG PO TABS
ORAL_TABLET | ORAL | 1 refills | Status: DC
Start: 2021-03-10 — End: 2021-04-21

## 2021-03-10 MED ORDER — SERTRALINE HCL 100 MG PO TABS: ORAL_TABLET | ORAL | 0 refills | Status: DC

## 2021-03-10 NOTE — Addendum Note (Signed)
Addended by: Anastasio Auerbach R on: 03/10/2021 11:37 AM   Modules accepted: Orders

## 2021-03-10 NOTE — Telephone Encounter (Signed)
I was able to send in the Zoloft Clonidine is no longer on her med list.  Please confirm with the patient #1 is she taking clonidine? #2 if so what is the tablet size and the dosing that she is following Then we can send in the prescription  Follow-up office visit in November as planned

## 2021-03-10 NOTE — Telephone Encounter (Signed)
Spoke with patient , she confirms is taking chlonidine 0.3 mg - 1/2 tab po bid , sent to pharmacy per drs notes.

## 2021-03-29 ENCOUNTER — Other Ambulatory Visit: Payer: Self-pay | Admitting: Gastroenterology

## 2021-04-13 ENCOUNTER — Other Ambulatory Visit: Payer: Self-pay | Admitting: Family Medicine

## 2021-04-21 ENCOUNTER — Other Ambulatory Visit: Payer: Self-pay

## 2021-04-21 ENCOUNTER — Ambulatory Visit (INDEPENDENT_AMBULATORY_CARE_PROVIDER_SITE_OTHER): Payer: Medicare Other | Admitting: Family Medicine

## 2021-04-21 VITALS — BP 144/92 | HR 84 | Ht 62.5 in | Wt 209.8 lb

## 2021-04-21 DIAGNOSIS — R7303 Prediabetes: Secondary | ICD-10-CM

## 2021-04-21 DIAGNOSIS — I10 Essential (primary) hypertension: Secondary | ICD-10-CM

## 2021-04-21 DIAGNOSIS — E782 Mixed hyperlipidemia: Secondary | ICD-10-CM | POA: Diagnosis not present

## 2021-04-21 MED ORDER — VALSARTAN 160 MG PO TABS: ORAL_TABLET | ORAL | 1 refills | Status: DC

## 2021-04-21 MED ORDER — SERTRALINE HCL 100 MG PO TABS: ORAL_TABLET | ORAL | 1 refills | Status: DC

## 2021-04-21 MED ORDER — CLONIDINE HCL 0.2 MG PO TABS: 0.2000 mg | ORAL_TABLET | Freq: Two times a day (BID) | ORAL | 1 refills | Status: DC

## 2021-04-21 MED ORDER — ATORVASTATIN CALCIUM 40 MG PO TABS: ORAL_TABLET | ORAL | 1 refills | Status: DC

## 2021-04-21 NOTE — Progress Notes (Signed)
   Subjective:    Patient ID: Aimee Dixon, female    DOB: 1950/08/21, 70 y.o.   MRN: [id number]  Hypertension This is a chronic problem. The current episode started more than 1 year ago. Risk factors for coronary artery disease include dyslipidemia and post-menopausal state. Treatments tried: valsartan, clonidine.   Very nice patient Somewhat stressed about her husband's illnesses Having to take care of her husband and good portion of the time  Review of Systems     Objective:   Physical Exam  General-in no acute distress Eyes-no discharge Lungs-respiratory rate normal, CTA CV-no murmurs,RRR Extremities skin warm dry no edema Neuro grossly normal Behavior normal, alert       Assessment & Plan:   Blood pressure elevated Increase clonidine 0.2 mg twice daily Keep everything else as is Follow-up in several weeks to recheck blood pressure Healthy diet recommended Walking recommended Stress reduction with her husband recommended She will need to draw boundaries so that her husband tries to become more independent in the areas that he is capable of being independent

## 2021-05-05 ENCOUNTER — Encounter (HOSPITAL_COMMUNITY): Payer: Self-pay | Admitting: Emergency Medicine

## 2021-05-05 ENCOUNTER — Emergency Department (HOSPITAL_COMMUNITY)
Admission: EM | Admit: 2021-05-05 | Discharge: 2021-05-05 | Disposition: A | Discharge status: Home / Self Care | Payer: Medicare Other | Attending: Emergency Medicine | Admitting: Emergency Medicine

## 2021-05-05 ENCOUNTER — Other Ambulatory Visit: Payer: Self-pay

## 2021-05-05 DIAGNOSIS — R040 Epistaxis: Secondary | ICD-10-CM | POA: Insufficient documentation

## 2021-05-05 DIAGNOSIS — Z79899 Other long term (current) drug therapy: Secondary | ICD-10-CM | POA: Diagnosis not present

## 2021-05-05 DIAGNOSIS — I10 Essential (primary) hypertension: Secondary | ICD-10-CM | POA: Diagnosis not present

## 2021-05-05 MED ORDER — METOPROLOL TARTRATE 50 MG PO TABS
50.0000 mg | ORAL_TABLET | Freq: Once | ORAL | Status: AC
  Administered 2021-05-05: 50 mg via ORAL
  Filled 2021-05-05: qty 1

## 2021-05-05 MED ORDER — CLONIDINE HCL 0.2 MG PO TABS
0.2000 mg | ORAL_TABLET | Freq: Once | ORAL | Status: AC
  Administered 2021-05-05: 0.2 mg via ORAL
  Filled 2021-05-05: qty 1

## 2021-05-05 MED ORDER — IRBESARTAN 150 MG PO TABS
150.0000 mg | ORAL_TABLET | Freq: Once | ORAL | Status: AC
  Administered 2021-05-05: 150 mg via ORAL
  Filled 2021-05-05: qty 1

## 2021-05-05 NOTE — ED Triage Notes (Signed)
Patient complaining of nose bleed that woke her up around 630 this morning. At this time bleeding seems to have slowed.

## 2021-05-05 NOTE — ED Provider Notes (Signed)
[facility] EMERGENCY DEPARTMENT Provider Note   CSN: [id number] Arrival date & time: 05/05/21  0757     History Chief Complaint  Patient presents with   Epistaxis    Aimee Dixon is a 70 y.o. female.  Patient presents ER chief complaint of bloody nose.  She states that it woke her up in the middle the night with the bloody nose.  Denies any trauma.  She was unable to stop the bleeding at home and presented to the ER.  However by the time she arrived to the ER the bleeding had stopped.  Otherwise denies any headache or chest pain.  No fever no cough no vomiting no diarrhea.  She states she has not yet taken her blood pressure medications.      Past Medical History:  Diagnosis Date   Hyperlipidemia    Hypertension    IDA (iron deficiency anemia)    Tubular adenoma of colon     Patient Active Problem List   Diagnosis Date Noted   Chronic diarrhea 06/24/2020   Change in bowel habits 11/22/2019   GERD (gastroesophageal reflux disease) 11/22/2019   Vitamin D deficiency 09/22/2016   Onychomycosis 09/09/2015   Morbid obesity ([place]) 08/09/2014   Pain in joint, hand 10/28/2013   Muscle weakness (generalized) 10/28/2013   Lack of coordination 10/28/2013   Prediabetes 08/02/2013   HTN (hypertension), benign 08/02/2013   Hyperlipidemia 08/02/2013   Arthralgia of hand 08/02/2013   Tubular adenoma of colon    KNEE, ARTHRITIS, DEGEN./OSTEO 12/05/2007    Past Surgical History:  Procedure Laterality Date   ABDOMINAL HYSTERECTOMY     BIOPSY  03/26/2020   Procedure: BIOPSY;  Surgeon: Daneil Dolin, MD;  [facility];  Service: Endoscopy;;   COLONOSCOPY  06/30/2003   GSU:PJSR papilla/Otherwise,normal rectum/Pedunculated polyp at middescending colon/Smaller pedunculated polyp at hepatic flexure, cold snared   COLONOSCOPY   08/07/2006   PRX:[id number] internal hemorrhoids, otherwise normal rectum/Diminutive ascending colon adenomatous polyps   COLONOSCOPY  08/03/2009    RMR: tubular adenomas removed from hepatic flexure & desc colon, internal hemorrhoids and anal papilla otherwise norma;   COLONOSCOPY N/A 10/03/2012   Procedure: COLONOSCOPY;  Surgeon: Daneil Dolin, MD;  [facility];  Service: Endoscopy;  Laterality: N/A;  2:15   COLONOSCOPY N/A 01/17/2018   Three 4-7 mm polyps in descending colon and at hepatic flexure, s/p removal. Tubular adenomas. Surveillance in 2022.    COLONOSCOPY WITH PROPOFOL N/A 03/26/2020    one 10 mm polyp (tubular adenoma), otherwise normal, s/p segmental biopsies (benign)   POLYPECTOMY  01/17/2018   Procedure: POLYPECTOMY;  Surgeon: Daneil Dolin, MD;  [facility];  Service: Endoscopy;;  hepatic flexure x2;descending colon   POLYPECTOMY  03/26/2020   Procedure: POLYPECTOMY;  Surgeon: Daneil Dolin, MD;  [facility];  Service: Endoscopy;;     OB History   No obstetric history on file.     Family History  Problem Relation Age of Onset   Lung cancer Father    Breast cancer Mother 18   Colon cancer Neg Hx     Social History   Tobacco Use   Smoking status: Never   Smokeless tobacco: Never  Vaping Use   Vaping Use: Never used  Substance Use Topics   Alcohol use: No   Drug use: No    Home Medications Prior to Admission medications   Medication Sig Start Date End Date Taking? Authorizing Provider  acetaminophen (  TYLENOL) 500 MG tablet Take 1,000 mg by mouth as needed for mild pain or moderate pain.   Yes [provider]  albuterol (VENTOLIN HFA) 108 (90 Base) MCG/ACT inhaler Inhale 2 puffs into the lungs every 4 (four) hours as needed for wheezing. 04/16/19  Yes Luking, Elayne Snare, MD  atorvastatin (LIPITOR) 40 MG tablet TAKE 1 TABLET(40 MG) BY MOUTH DAILY 04/21/21  Yes Luking, Scott A, MD  azelastine (ASTELIN) 0.1 % nasal spray Place 2 sprays into both nostrils 2 (two) times daily. 10/19/20  Yes Kathyrn Drown, MD  cloNIDine (CATAPRES) 0.2 MG tablet Take 1 tablet (0.2 mg  total) by mouth 2 (two) times daily. 04/21/21  Yes Luking, Elayne Snare, MD  dicyclomine (BENTYL) 10 MG capsule TAKE 1 CAPSULE(10 MG) BY MOUTH FOUR TIMES DAILY BEFORE MEALS AND AT BEDTIME Patient taking differently: Take 10 mg by mouth in the morning, at noon, in the evening, and at bedtime. 11/25/20  Yes Mahala Menghini, PA-C  fexofenadine (ALLEGRA) 180 MG tablet Take 180 mg by mouth daily.   Yes [provider]  guaiFENesin (MUCINEX) 600 MG 12 hr tablet Take 600 mg by mouth daily.    Yes [provider]  Multiple Vitamins-Minerals (MULTIVITAMIN WITH MINERALS) tablet Take 1 tablet by mouth daily.   Yes [provider]  pantoprazole (PROTONIX) 40 MG tablet Take 1 tablet (40 mg total) by mouth daily. 03/29/21  Yes Aliene Altes S, PA-C  potassium chloride (KLOR-CON) 10 MEQ tablet Take one tablet po daily Patient taking differently: Take 10 mEq by mouth daily. 02/09/21  Yes Luking, Elayne Snare, MD  sertraline (ZOLOFT) 100 MG tablet TAKE 1 AND 1/2 TABLETS BY MOUTH DAILY Patient taking differently: Take 150 mg by mouth daily. 04/21/21  Yes Luking, Elayne Snare, MD  valsartan (DIOVAN) 160 MG tablet TAKE 1 TABLET(160 MG) BY MOUTH DAILY Patient taking differently: Take 160 mg by mouth daily. 04/21/21  Yes Kathyrn Drown, MD    Allergies    Apresoline [hydralazine] and Norvasc [amlodipine besylate]  Review of Systems   Review of Systems  Constitutional:  Negative for fever.  HENT:  Negative for ear pain.   Eyes:  Negative for pain.  Respiratory:  Negative for cough.   Cardiovascular:  Negative for chest pain.  Gastrointestinal:  Negative for abdominal pain.  Genitourinary:  Negative for flank pain.  Musculoskeletal:  Negative for back pain.  Skin:  Negative for rash.  Neurological:  Negative for headaches.   Physical Exam Updated Vital Signs BP (!) 173/87   Pulse 67   Temp 98.2 F (36.8 C) (Oral)   Resp 10   Ht 5\' 2"  (1.575 m)   Wt 95.3 kg   SpO2 98%   BMI 38.41 kg/m    Physical Exam Constitutional:      General: She is not in acute distress.    Appearance: Normal appearance.  HENT:     Head: Normocephalic.     Nose: Nose normal.     Comments: No active bleeding seen in either nostril. Eyes:     Extraocular Movements: Extraocular movements intact.  Cardiovascular:     Rate and Rhythm: Normal rate.  Pulmonary:     Effort: Pulmonary effort is normal.  Musculoskeletal:        General: Normal range of motion.     Cervical back: Normal range of motion.  Neurological:     General: No focal deficit present.     Mental Status: She is alert. Mental  status is at baseline.    ED Results / Procedures / Treatments   Labs (all labs ordered are listed, but only abnormal results are displayed) Labs Reviewed - No data to display  EKG None  Radiology No results found.  Procedures Procedures   Medications Ordered in ED Medications  irbesartan (AVAPRO) tablet 150 mg (150 mg Oral Given 05/05/21 0821)  cloNIDine (CATAPRES) tablet 0.2 mg (0.2 mg Oral Given 05/05/21 0940)  metoprolol tartrate (LOPRESSOR) tablet 50 mg (50 mg Oral Given 05/05/21 1050)    ED Course  I have reviewed the triage vital signs and the nursing notes.  Pertinent labs & imaging results that were available during my care of the patient were reviewed by me and considered in my medical decision making (see chart for details).    MDM Rules/Calculators/A&P                           Patient arrived severely hypertensive greater than 098 systolic.  Given her home medications with minimal improvement.  Lopressor added in addition, with subsequent improvement in blood pressure.  Patient monitored for several hours, no additional epistaxis noted.  Discharged home in stable condition advised outpatient follow-up with her doctor in 3 to 4 days.  Advised immediate return if she has uncontrollable bleeding fevers headache or any additional concerns.  Final Clinical Impression(s) / ED  Diagnoses Final diagnoses:  Epistaxis  Hypertension, unspecified type    Rx / DC Orders ED Discharge Orders     None        Luna Fuse, MD 05/05/21 1159

## 2021-05-05 NOTE — Discharge Instructions (Signed)
Call your primary care doctor or specialist as discussed in the next 2-3 days.   Return immediately back to the ER if:  Your symptoms worsen within the next 12-24 hours. You develop new symptoms such as new fevers, persistent vomiting, new pain, shortness of breath, or new weakness or numbness, or if you have any other concerns.  

## 2021-05-10 ENCOUNTER — Other Ambulatory Visit: Payer: Self-pay

## 2021-05-10 ENCOUNTER — Ambulatory Visit (INDEPENDENT_AMBULATORY_CARE_PROVIDER_SITE_OTHER): Payer: Medicare Other | Admitting: Family Medicine

## 2021-05-10 VITALS — BP 173/97 | HR 69 | Ht 62.5 in | Wt 209.2 lb

## 2021-05-10 DIAGNOSIS — I10 Essential (primary) hypertension: Secondary | ICD-10-CM

## 2021-05-10 DIAGNOSIS — R040 Epistaxis: Secondary | ICD-10-CM

## 2021-05-10 MED ORDER — VALACYCLOVIR HCL 1 G PO TABS: 1000.0000 mg | ORAL_TABLET | Freq: Three times a day (TID) | ORAL | 0 refills | Status: DC

## 2021-05-10 MED ORDER — NIFEDIPINE ER OSMOTIC RELEASE 30 MG PO TB24: 30.0000 mg | ORAL_TABLET | Freq: Every day | ORAL | 5 refills | Status: DC

## 2021-05-10 NOTE — Patient Instructions (Signed)

## 2021-05-10 NOTE — Progress Notes (Signed)
   Subjective:    Patient ID: Aimee Dixon, female    DOB: [birth year], 70 y.o.   MRN: [id number]  HPI  Patient arrives for a follow up on recent hospitalization for hypertension.  Severe elevation of blood pressure recently as well as nosebleed intermittent frontal headaches but no global headache no nausea or diarrhea.  Energy level okay.  Taking her medicines as directed Patient states in the past she did have some problems with Norvasc causing her Gollan gums to bleed but does not think she had any other trouble with the medicine The nosebleeds he recently had was severe enough to end up in the ER but now doing better Review of Systems     Objective:   Physical Exam General-in no acute distress Eyes-no discharge Lungs-respiratory rate normal, CTA CV-no murmurs,RRR Extremities skin warm dry no edema Neuro grossly normal Behavior normal, alert  Appears to have significant outbreak in the left nostril area consistent with herpes      Assessment & Plan:  Poorly controlled HTN continue current medications add nifedipine XL 30 mg 1 daily.  Hopefully this will help.  Recheck again in 2 weeks time to get a blood pressure monitor and bring it with her that measures at the bicep.  Will need large cuff.  Nosebleed referral to ENT for further evaluation no obvious area see  Recheck blood pressure 2 weeks  What appears to be herpes outbreak around the left nostril due to the irritation from her nosebleed recommend Valtrex for the next week because of the severity

## 2021-05-25 ENCOUNTER — Other Ambulatory Visit: Payer: Self-pay

## 2021-05-25 ENCOUNTER — Ambulatory Visit (INDEPENDENT_AMBULATORY_CARE_PROVIDER_SITE_OTHER): Payer: Medicare Other | Admitting: Family Medicine

## 2021-05-25 ENCOUNTER — Ambulatory Visit: Payer: Medicare Other | Admitting: Family Medicine

## 2021-05-25 ENCOUNTER — Encounter: Payer: Self-pay | Admitting: Family Medicine

## 2021-05-25 VITALS — BP 130/76 | HR 92 | Temp 97.2°F | Ht 62.5 in | Wt 206.0 lb

## 2021-05-25 DIAGNOSIS — I10 Essential (primary) hypertension: Secondary | ICD-10-CM | POA: Diagnosis not present

## 2021-05-25 NOTE — Progress Notes (Signed)
° °  Subjective:    Patient ID: Aimee Dixon, female    DOB: [birth year], 70 y.o.   MRN: [id number]  HPI Follow up hypertension , reports home readings 130 to 170 over 60 to 70  Patient has been trying to watch her diet Trying to take her meds as directed Denies any major setbacks She states she has been eating healthy until just recently   Review of Systems     Objective:   Physical Exam  General-in no acute distress Eyes-no discharge Lungs-respiratory rate normal, CTA CV-no murmurs,RRR Extremities skin warm dry no edema Neuro grossly normal Behavior normal, alert       Assessment & Plan:   Elevated blood pressure After I let her sit for several minutes it came down to normal Continue current measures Encouraged her to eat healthy stay physically active If she sees dramatic improvement with her blood pressure perhaps we can reduce clonidine to 0.1 mg twice daily  She will follow-up by spring time

## 2021-05-25 NOTE — Patient Instructions (Signed)
Monitor your blood pressure a couple times a week.  If you are having any trouble let us know.  Otherwise we will see you in April.  TakeCare-Dr. Nicki Reaper

## 2021-05-29 ENCOUNTER — Telehealth: Payer: Medicare Other | Admitting: Physician Assistant

## 2021-05-29 DIAGNOSIS — B9689 Other specified bacterial agents as the cause of diseases classified elsewhere: Secondary | ICD-10-CM

## 2021-05-29 DIAGNOSIS — J019 Acute sinusitis, unspecified: Secondary | ICD-10-CM

## 2021-05-29 MED ORDER — AMOXICILLIN-POT CLAVULANATE 875-125 MG PO TABS: 1.0000 | ORAL_TABLET | Freq: Two times a day (BID) | ORAL | 0 refills | Status: DC

## 2021-05-29 NOTE — Progress Notes (Signed)

## 2021-05-29 NOTE — Progress Notes (Signed)
I have spent 5 minutes in review of e-visit questionnaire, review and updating patient chart, medical decision making and response to patient.   Parmvir Boomer Cody Kuklinski, PA-C    

## 2021-06-29 ENCOUNTER — Encounter: Payer: Self-pay | Admitting: Internal Medicine

## 2021-07-07 ENCOUNTER — Other Ambulatory Visit: Payer: Self-pay | Admitting: Family Medicine

## 2021-08-05 ENCOUNTER — Other Ambulatory Visit: Payer: Self-pay | Admitting: Family Medicine

## 2021-08-18 DIAGNOSIS — J301 Allergic rhinitis due to pollen: Secondary | ICD-10-CM | POA: Diagnosis not present

## 2021-08-18 DIAGNOSIS — J3489 Other specified disorders of nose and nasal sinuses: Secondary | ICD-10-CM | POA: Diagnosis not present

## 2021-09-10 DIAGNOSIS — J301 Allergic rhinitis due to pollen: Secondary | ICD-10-CM | POA: Diagnosis not present

## 2021-09-10 DIAGNOSIS — J3489 Other specified disorders of nose and nasal sinuses: Secondary | ICD-10-CM | POA: Diagnosis not present

## 2021-09-17 ENCOUNTER — Telehealth: Payer: Self-pay | Admitting: Family Medicine

## 2021-09-17 DIAGNOSIS — R7303 Prediabetes: Secondary | ICD-10-CM

## 2021-09-17 DIAGNOSIS — E782 Mixed hyperlipidemia: Secondary | ICD-10-CM

## 2021-09-17 DIAGNOSIS — I10 Essential (primary) hypertension: Secondary | ICD-10-CM

## 2021-09-17 NOTE — Telephone Encounter (Signed)
Labs ordered and pt made aware.

## 2021-09-17 NOTE — Telephone Encounter (Signed)
Lipid, liver, metabolic 7, S8N, urine ACR ?Prediabetes, hypertension, hyperlipidemia ? ?

## 2021-09-17 NOTE — Telephone Encounter (Signed)
Patient has appointment on 4/27 for 4 month follow up will she need labs done before appointment. Please advise ?

## 2021-09-20 DIAGNOSIS — R7303 Prediabetes: Secondary | ICD-10-CM | POA: Diagnosis not present

## 2021-09-20 DIAGNOSIS — E782 Mixed hyperlipidemia: Secondary | ICD-10-CM | POA: Diagnosis not present

## 2021-09-20 DIAGNOSIS — I10 Essential (primary) hypertension: Secondary | ICD-10-CM | POA: Diagnosis not present

## 2021-09-21 DIAGNOSIS — J301 Allergic rhinitis due to pollen: Secondary | ICD-10-CM | POA: Diagnosis not present

## 2021-09-22 LAB — LIPID PANEL
Chol/HDL Ratio: 3.4 ratio (ref 0.0–4.4)
Cholesterol, Total: 170 mg/dL (ref 100–199)
HDL: 50 mg/dL (ref >39)
LDL Chol Calc (NIH): 95 mg/dL (ref 0–99)
Triglycerides: 142 mg/dL (ref 0–149)
VLDL Cholesterol Cal: 25 mg/dL (ref 5–40)

## 2021-09-22 LAB — HEPATIC FUNCTION PANEL
ALT: 21 IU/L (ref 0–32)
AST: 18 IU/L (ref 0–40)
Albumin: 4.4 g/dL (ref 3.8–4.8)
Alkaline Phosphatase: 123 IU/L — ABNORMAL HIGH (ref 44–121)
Bilirubin Total: 0.5 mg/dL (ref 0.0–1.2)
Bilirubin, Direct: 0.13 mg/dL (ref 0.00–0.40)
Total Protein: 7.2 g/dL (ref 6.0–8.5)

## 2021-09-22 LAB — BASIC METABOLIC PANEL
BUN/Creatinine Ratio: 15 (ref 12–28)
BUN: 10 mg/dL (ref 8–27)
CO2: 29 mmol/L (ref 20–29)
Calcium: 10 mg/dL (ref 8.7–10.3)
Chloride: 103 mmol/L (ref 96–106)
Creatinine, Ser: 0.67 mg/dL (ref 0.57–1.00)
Glucose: 119 mg/dL — ABNORMAL HIGH (ref 70–99)
Potassium: 3.7 mmol/L (ref 3.5–5.2)
Sodium: 144 mmol/L (ref 134–144)
eGFR: 95 mL/min/{1.73_m2} (ref >59)

## 2021-09-22 LAB — MICROALBUMIN / CREATININE URINE RATIO
Creatinine, Urine: 28.1 mg/dL
Microalb/Creat Ratio: 13 mg/g creat (ref 0–29)
Microalbumin, Urine: 3.6 ug/mL

## 2021-09-22 LAB — HEMOGLOBIN A1C
Est. average glucose Bld gHb Est-mCnc: 151 mg/dL
Hgb A1c MFr Bld: 6.9 % — ABNORMAL HIGH (ref 4.8–5.6)

## 2021-09-23 ENCOUNTER — Encounter: Payer: Self-pay | Admitting: Family Medicine

## 2021-09-23 ENCOUNTER — Ambulatory Visit (INDEPENDENT_AMBULATORY_CARE_PROVIDER_SITE_OTHER): Payer: Medicare Other | Admitting: Family Medicine

## 2021-09-23 VITALS — BP 160/86 | HR 85 | Temp 98.4°F | Wt 216.4 lb

## 2021-09-23 DIAGNOSIS — E785 Hyperlipidemia, unspecified: Secondary | ICD-10-CM | POA: Diagnosis not present

## 2021-09-23 DIAGNOSIS — E876 Hypokalemia: Secondary | ICD-10-CM | POA: Diagnosis not present

## 2021-09-23 DIAGNOSIS — E119 Type 2 diabetes mellitus without complications: Secondary | ICD-10-CM

## 2021-09-23 DIAGNOSIS — I10 Essential (primary) hypertension: Secondary | ICD-10-CM

## 2021-09-23 DIAGNOSIS — E1169 Type 2 diabetes mellitus with other specified complication: Secondary | ICD-10-CM | POA: Diagnosis not present

## 2021-09-23 DIAGNOSIS — Z23 Encounter for immunization: Secondary | ICD-10-CM | POA: Diagnosis not present

## 2021-09-23 MED ORDER — ROSUVASTATIN CALCIUM 40 MG PO TABS: 40.0000 mg | ORAL_TABLET | Freq: Every day | ORAL | 3 refills | Status: DC

## 2021-09-23 MED ORDER — METFORMIN HCL 500 MG PO TABS: 500.0000 mg | ORAL_TABLET | Freq: Every day | ORAL | 1 refills | Status: DC

## 2021-09-23 MED ORDER — ROSUVASTATIN CALCIUM 40 MG PO TABS: 40.0000 mg | ORAL_TABLET | Freq: Every day | ORAL | 1 refills | Status: DC

## 2021-09-23 MED ORDER — CLONIDINE HCL 0.3 MG PO TABS: 0.3000 mg | ORAL_TABLET | Freq: Two times a day (BID) | ORAL | 5 refills | Status: DC

## 2021-09-23 MED ORDER — VALSARTAN-HYDROCHLOROTHIAZIDE 160-25 MG PO TABS: 1.0000 | ORAL_TABLET | Freq: Every day | ORAL | 5 refills | Status: DC

## 2021-09-23 NOTE — Progress Notes (Signed)
09/23/21-lab order placed and mailed to pt with note to have done in 2 weeks  ?

## 2021-09-23 NOTE — Progress Notes (Signed)
? ?  Subjective:  ? ? Patient ID: Aimee Dixon, female    DOB: 1951/05/03, 71 y.o.   MRN: [id number] ? ?HPI ?Pt following up on blood pressure. No issues with blood pressure. Pt is checking blood pressure at home periodically.  ? ?Pt states PCP changed Clonidine to 0.3 mg but pharmacy refilled 0.2 mg. ? ? Acid reflux doing well.  ? ?Doing well on Lipitor.  ? ?Pt is seeing an ENT in [place].  ? ? ?Review of Systems ? ?   ?Objective:  ? Physical Exam ? ?General-in no acute distress ?Eyes-no discharge ?Lungs-respiratory rate normal, CTA ?CV-no murmurs,RRR ?Extremities skin warm dry no edema ?Neuro grossly normal ?Behavior normal, alert ? ? ? ?   ?Assessment & Plan:  ?1. HTN (hypertension), benign ?Blood pressure not under good control ?Recommend bumping up the dose of clonidine 0.3 mg twice daily ?Patient to change valsartan to valsartan-hydrochlorothiazide 25 mg 1 every single day ?Repeat met 7 in 2 to 3 weeks ? ?2. Need for vaccination ?today ?- Pneumococcal conjugate vaccine 20-valent (Prevnar 20) ? ?Diabetes new onset recommend ?Recommend Crestor ?Stop atorvastatin ? ?Nurse check bp in 2 weeks ?Lab in 2 weeks ? OV by fall ?Start metformin for DM ?Better diet ?walking ?

## 2021-09-23 NOTE — Patient Instructions (Signed)

## 2021-09-23 NOTE — Progress Notes (Signed)
Rosuvastatin sent to pharmacy. Pt has nurse visit set for May 15 ?

## 2021-10-07 ENCOUNTER — Telehealth: Payer: Self-pay

## 2021-10-07 NOTE — Telephone Encounter (Signed)
I agree I would recommend half a tablet of the clonidine twice daily. ?Patient has a nurse visit next week please make sure we check her blood pressure per proper protocol then send me the result thank you ? ?May continue all other medicines as is ?

## 2021-10-07 NOTE — Telephone Encounter (Signed)
Caller name:Janeice Bonillas  ? ?On DPR? :No  ? ?Call back number:[phone] ? ?Provider they see: Dr Wolfgang Phoenix  ? ?Reason for call:Pt was put on cloNIDine (CATAPRES) 0.3 MG tablet  now her blood pressure is to low  ? ?

## 2021-10-07 NOTE — Telephone Encounter (Signed)
Patient states her BP has been running low last few days since starting med- 102/57 and 121/64- Patient wonders if she should just half the pill for now Please advise ?

## 2021-10-07 NOTE — Telephone Encounter (Signed)
Patient informed of md message and recommendations. Verbalized understanding. 

## 2021-10-12 DIAGNOSIS — I10 Essential (primary) hypertension: Secondary | ICD-10-CM | POA: Diagnosis not present

## 2021-10-13 LAB — BASIC METABOLIC PANEL
BUN/Creatinine Ratio: 14 (ref 12–28)
BUN: 12 mg/dL (ref 8–27)
CO2: 28 mmol/L (ref 20–29)
Calcium: 10.1 mg/dL (ref 8.7–10.3)
Chloride: 99 mmol/L (ref 96–106)
Creatinine, Ser: 0.87 mg/dL (ref 0.57–1.00)
Glucose: 192 mg/dL — ABNORMAL HIGH (ref 70–99)
Potassium: 3.3 mmol/L — ABNORMAL LOW (ref 3.5–5.2)
Sodium: 142 mmol/L (ref 134–144)
eGFR: 73 mL/min/{1.73_m2} (ref >59)

## 2021-10-14 ENCOUNTER — Other Ambulatory Visit (INDEPENDENT_AMBULATORY_CARE_PROVIDER_SITE_OTHER): Payer: Medicare Other | Admitting: *Deleted

## 2021-10-14 VITALS — BP 124/58

## 2021-10-14 DIAGNOSIS — I10 Essential (primary) hypertension: Secondary | ICD-10-CM | POA: Diagnosis not present

## 2021-10-14 MED ORDER — POTASSIUM CHLORIDE CRYS ER 10 MEQ PO TBCR: EXTENDED_RELEASE_TABLET | ORAL | 1 refills | Status: DC

## 2021-10-14 NOTE — Addendum Note (Signed)
Addended by: Dairl Ponder on: 10/14/2021 09:09 AM   Modules accepted: Orders

## 2021-10-14 NOTE — Progress Notes (Signed)
   Subjective:    Patient ID: Aimee Dixon, female    DOB: April 08, 1951, 71 y.o.   MRN: [id number]  HPI  Patient arrives for a blood pressure check. Patient's BP today 124/58. Consult with Dr Lacinda Axon: Keep meds as is and follow up as advised. Patient notified and verbalized understanding.  Review of Systems     Objective:   Physical Exam        Assessment & Plan:

## 2021-10-17 ENCOUNTER — Telehealth: Payer: Self-pay | Admitting: Family Medicine

## 2021-10-17 NOTE — Telephone Encounter (Signed)
Nurses-patient should be on valsartan/HCTZ  Recently received notification from her insurance company that she was getting prescription for valsartan/HCTZ and valsartan.  Make sure that the patient is only taking valsartan/HCTZ she should not be taking both Please document accordingly let me know if any problems thank you

## 2021-10-18 NOTE — Telephone Encounter (Signed)
Pt contacted and verbalized understanding. Pt is only taking Valsartan/HCTZ. Pt did check meds while on the phone.

## 2021-10-18 NOTE — Telephone Encounter (Signed)
That is good thank you

## 2021-10-21 ENCOUNTER — Other Ambulatory Visit: Payer: Self-pay | Admitting: Family Medicine

## 2021-11-07 ENCOUNTER — Other Ambulatory Visit: Payer: Self-pay | Admitting: Family Medicine

## 2021-11-15 DIAGNOSIS — E876 Hypokalemia: Secondary | ICD-10-CM | POA: Diagnosis not present

## 2021-11-16 LAB — BASIC METABOLIC PANEL
BUN/Creatinine Ratio: 15 (ref 12–28)
BUN: 11 mg/dL (ref 8–27)
CO2: 22 mmol/L (ref 20–29)
Calcium: 10.3 mg/dL (ref 8.7–10.3)
Chloride: 99 mmol/L (ref 96–106)
Creatinine, Ser: 0.73 mg/dL (ref 0.57–1.00)
Glucose: 119 mg/dL — ABNORMAL HIGH (ref 70–99)
Potassium: 3.7 mmol/L (ref 3.5–5.2)
Sodium: 142 mmol/L (ref 134–144)
eGFR: 90 mL/min/{1.73_m2} (ref >59)

## 2021-12-03 ENCOUNTER — Other Ambulatory Visit (HOSPITAL_COMMUNITY): Payer: Self-pay | Admitting: Family Medicine

## 2021-12-03 DIAGNOSIS — Z1231 Encounter for screening mammogram for malignant neoplasm of breast: Secondary | ICD-10-CM

## 2021-12-07 ENCOUNTER — Other Ambulatory Visit: Payer: Self-pay | Admitting: *Deleted

## 2021-12-07 MED ORDER — SERTRALINE HCL 100 MG PO TABS: ORAL_TABLET | ORAL | 0 refills | Status: DC

## 2021-12-15 ENCOUNTER — Ambulatory Visit (HOSPITAL_COMMUNITY)
Admission: RE | Admit: 2021-12-15 | Discharge: 2021-12-15 | Disposition: A | Discharge status: Home / Self Care | Payer: Medicare Other | Source: Ambulatory Visit | Attending: Family Medicine | Admitting: Family Medicine

## 2021-12-15 DIAGNOSIS — Z1231 Encounter for screening mammogram for malignant neoplasm of breast: Secondary | ICD-10-CM | POA: Insufficient documentation

## 2022-02-10 NOTE — Progress Notes (Signed)
[facility] Quality Team Note  Name: Aimee Dixon Date of Birth: 18-Aug-1950 MRN: [id number] Date: 02/10/2022  [facility] Quality Team has reviewed this patient's chart, please see recommendations below:  Diabetic Retinal Eye Exam; Patient requests [facility] Quality Coordinator to schedule Diabetic Retinal Screening at [facility] Event 03/03/2022 10:00AM, this event is combined for [facility] to attend).

## 2022-03-08 ENCOUNTER — Other Ambulatory Visit: Payer: Self-pay | Admitting: Family Medicine

## 2022-03-13 ENCOUNTER — Other Ambulatory Visit: Payer: Self-pay | Admitting: Family Medicine

## 2022-03-14 ENCOUNTER — Ambulatory Visit (INDEPENDENT_AMBULATORY_CARE_PROVIDER_SITE_OTHER): Payer: Medicare Other

## 2022-03-14 VITALS — Wt 216.0 lb

## 2022-03-14 DIAGNOSIS — Z0000 Encounter for general adult medical examination without abnormal findings: Secondary | ICD-10-CM | POA: Diagnosis not present

## 2022-03-14 NOTE — Patient Instructions (Signed)
Aimee Dixon , Thank you for taking time to come for your Medicare Wellness Visit. I appreciate your ongoing commitment to your health goals. Please review the following plan we discussed and let me know if I can assist you in the future.   Screening recommendations/referrals: Colonoscopy: 03/26/20 Mammogram: 12/15/21 Bone Density: 09/28/16, referral sent Recommended yearly ophthalmology/optometry visit for glaucoma screening and checkup Recommended yearly dental visit for hygiene and checkup  Vaccinations: Influenza vaccine: 04/23/21 Pneumococcal vaccine: 09/23/21 Tdap vaccine: 07/01/11, due if have injury Shingles vaccine: Zostavax 04/09/13   Shingrix 02/11/20, needs 2nd shot   Covid-19:07/07/19, 08/07/19, 05/04/20  Advanced directives: no  Conditions/risks identified: none  Next appointment: Follow up in one year for your annual wellness visit 03/21/23 @ 2:30 pm by phone   Preventive Care 65 Years and Older, Female Preventive care refers to lifestyle choices and visits with your health care provider that can promote health and wellness. What does preventive care include? A yearly physical exam. This is also called an annual well check. Dental exams once or twice a year. Routine eye exams. Ask your health care provider how often you should have your eyes checked. Personal lifestyle choices, including: Daily care of your teeth and gums. Regular physical activity. Eating a healthy diet. Avoiding tobacco and drug use. Limiting alcohol use. Practicing safe sex. Taking low-dose aspirin every day. Taking vitamin and mineral supplements as recommended by your health care provider. What happens during an annual well check? The services and screenings done by your health care provider during your annual well check will depend on your age, overall health, lifestyle risk factors, and family history of disease. Counseling  Your health care provider may ask you questions about your: Alcohol  use. Tobacco use. Drug use. Emotional well-being. Home and relationship well-being. Sexual activity. Eating habits. History of falls. Memory and ability to understand (cognition). Work and work Statistician. Reproductive health. Screening  You may have the following tests or measurements: Height, weight, and BMI. Blood pressure. Lipid and cholesterol levels. These may be checked every 5 years, or more frequently if you are over 6 years old. Skin check. Lung cancer screening. You may have this screening every year starting at age 69 if you have a 30-pack-year history of smoking and currently smoke or have quit within the past 15 years. Fecal occult blood test (FOBT) of the stool. You may have this test every year starting at age 52. Flexible sigmoidoscopy or colonoscopy. You may have a sigmoidoscopy every 5 years or a colonoscopy every 10 years starting at age 54. Hepatitis C blood test. Hepatitis B blood test. Sexually transmitted disease (STD) testing. Diabetes screening. This is done by checking your blood sugar (glucose) after you have not eaten for a while (fasting). You may have this done every 1-3 years. Bone density scan. This is done to screen for osteoporosis. You may have this done starting at age 26. Mammogram. This may be done every 1-2 years. Talk to your health care provider about how often you should have regular mammograms. Talk with your health care provider about your test results, treatment options, and if necessary, the need for more tests. Vaccines  Your health care provider may recommend certain vaccines, such as: Influenza vaccine. This is recommended every year. Tetanus, diphtheria, and acellular pertussis (Tdap, Td) vaccine. You may need a Td booster every 10 years. Zoster vaccine. You may need this after age 66. Pneumococcal 13-valent conjugate (PCV13) vaccine. One dose is recommended after age 90. Pneumococcal polysaccharide (  PPSV23) vaccine. One dose is  recommended after age 41. Talk to your health care provider about which screenings and vaccines you need and how often you need them. This information is not intended to replace advice given to you by your health care provider. Make sure you discuss any questions you have with your health care provider. Document Released: 06/12/2015 Document Revised: 02/03/2016 Document Reviewed: 03/17/2015 Elsevier Interactive Patient Education  2017 [place] Prevention in the Home Falls can cause injuries. They can happen to people of all ages. There are many things you can do to make your home safe and to help prevent falls. What can I do on the outside of my home? Regularly fix the edges of walkways and driveways and fix any cracks. Remove anything that might make you trip as you walk through a door, such as a raised step or threshold. Trim any bushes or trees on the path to your home. Use bright outdoor lighting. Clear any walking paths of anything that might make someone trip, such as rocks or tools. Regularly check to see if handrails are loose or broken. Make sure that both sides of any steps have handrails. Any raised decks and porches should have guardrails on the edges. Have any leaves, snow, or ice cleared regularly. Use sand or salt on walking paths during winter. Clean up any spills in your garage right away. This includes oil or grease spills. What can I do in the bathroom? Use night lights. Install grab bars by the toilet and in the tub and shower. Do not use towel bars as grab bars. Use non-skid mats or decals in the tub or shower. If you need to sit down in the shower, use a plastic, non-slip stool. Keep the floor dry. Clean up any water that spills on the floor as soon as it happens. Remove soap buildup in the tub or shower regularly. Attach bath mats securely with double-sided non-slip rug tape. Do not have throw rugs and other things on the floor that can make you  trip. What can I do in the bedroom? Use night lights. Make sure that you have a light by your bed that is easy to reach. Do not use any sheets or blankets that are too big for your bed. They should not hang down onto the floor. Have a firm chair that has side arms. You can use this for support while you get dressed. Do not have throw rugs and other things on the floor that can make you trip. What can I do in the kitchen? Clean up any spills right away. Avoid walking on wet floors. Keep items that you use a lot in easy-to-reach places. If you need to reach something above you, use a strong step stool that has a grab bar. Keep electrical cords out of the way. Do not use floor polish or wax that makes floors slippery. If you must use wax, use non-skid floor wax. Do not have throw rugs and other things on the floor that can make you trip. What can I do with my stairs? Do not leave any items on the stairs. Make sure that there are handrails on both sides of the stairs and use them. Fix handrails that are broken or loose. Make sure that handrails are as long as the stairways. Check any carpeting to make sure that it is firmly attached to the stairs. Fix any carpet that is loose or worn. Avoid having throw rugs at the top or  bottom of the stairs. If you do have throw rugs, attach them to the floor with carpet tape. Make sure that you have a light switch at the top of the stairs and the bottom of the stairs. If you do not have them, ask someone to add them for you. What else can I do to help prevent falls? Wear shoes that: Do not have high heels. Have rubber bottoms. Are comfortable and fit you well. Are closed at the toe. Do not wear sandals. If you use a stepladder: Make sure that it is fully opened. Do not climb a closed stepladder. Make sure that both sides of the stepladder are locked into place. Ask someone to hold it for you, if possible. Clearly mark and make sure that you can  see: Any grab bars or handrails. First and last steps. Where the edge of each step is. Use tools that help you move around (mobility aids) if they are needed. These include: Canes. Walkers. Scooters. Crutches. Turn on the lights when you go into a dark area. Replace any light bulbs as soon as they burn out. Set up your furniture so you have a clear path. Avoid moving your furniture around. If any of your floors are uneven, fix them. If there are any pets around you, be aware of where they are. Review your medicines with your doctor. Some medicines can make you feel dizzy. This can increase your chance of falling. Ask your doctor what other things that you can do to help prevent falls. This information is not intended to replace advice given to you by your health care provider. Make sure you discuss any questions you have with your health care provider. Document Released: 03/12/2009 Document Revised: 10/22/2015 Document Reviewed: 06/20/2014 Elsevier Interactive Patient Education  2017 [employer].

## 2022-03-14 NOTE — Progress Notes (Signed)
Virtual Visit via Telephone Note  I connected with  Aimee Dixon on 03/14/22 at 11:30 AM EDT by telephone and verified that I am speaking with the correct person using two identifiers.  Location: Patient: home Provider: [facility] Persons participating in the virtual visit: patient/Nurse Health Advisor   I discussed the limitations, risks, security and privacy concerns of performing an evaluation and management service by telephone and the availability of in person appointments. The patient expressed understanding and agreed to proceed.  Interactive audio and video telecommunications were attempted between this nurse and patient, however failed, due to patient having technical difficulties OR patient did not have access to video capability.  We continued and completed visit with audio only.  Some vital signs may be absent or patient reported.   Dionisio David, LPN  Subjective:   Aimee Dixon is a 71 y.o. female who presents for Medicare Annual (Subsequent) preventive examination.  Review of Systems     Cardiac Risk Factors include: advanced age (>52mn, >>62women);diabetes mellitus;hypertension;dyslipidemia;sedentary lifestyle     Objective:    There were no vitals filed for this visit. There is no height or weight on file to calculate BMI.     03/14/2022   11:32 AM 05/05/2021    8:10 AM 03/09/2021    3:07 PM 03/26/2020   10:11 AM 01/17/2018    7:37 AM  Advanced Directives  Does Patient Have a Medical Advance Directive? No No No No No  Would patient like information on creating a medical advance directive? No - Patient declined  No - Patient declined No - Patient declined No - Patient declined    Current Medications (verified) Outpatient Encounter Medications as of 03/14/2022  Medication Sig   acetaminophen (TYLENOL) 500 MG tablet Take 1,000 mg by mouth as needed for mild pain or moderate pain.   albuterol (VENTOLIN HFA) 108 (90 Base) MCG/ACT inhaler Inhale 2 puffs into  the lungs every 4 (four) hours as needed for wheezing.   atorvastatin (LIPITOR) 40 MG tablet    azelastine (ASTELIN) 0.1 % nasal spray Place 2 sprays into both nostrils 2 (two) times daily.   cetirizine (ZYRTEC) 10 MG tablet Take 10 mg by mouth daily.   cloNIDine (CATAPRES) 0.3 MG tablet TAKE 1 TABLET(0.3 MG) BY MOUTH TWICE DAILY   dicyclomine (BENTYL) 10 MG capsule TAKE 1 CAPSULE(10 MG) BY MOUTH FOUR TIMES DAILY BEFORE MEALS AND AT BEDTIME (Patient taking differently: Take 10 mg by mouth in the morning, at noon, in the evening, and at bedtime.)   fexofenadine (ALLEGRA) 180 MG tablet Take 180 mg by mouth daily.   guaiFENesin (MUCINEX) 600 MG 12 hr tablet Take 600 mg by mouth daily.    metFORMIN (GLUCOPHAGE) 500 MG tablet Take 1 tablet (500 mg total) by mouth daily with breakfast.   Multiple Vitamins-Minerals (MULTIVITAMIN WITH MINERALS) tablet Take 1 tablet by mouth daily.   NIFEdipine (PROCARDIA-XL/NIFEDICAL-XL) 30 MG 24 hr tablet TAKE 1 TABLET(30 MG) BY MOUTH DAILY   pantoprazole (PROTONIX) 40 MG tablet Take 1 tablet (40 mg total) by mouth daily.   potassium chloride (KLOR-CON M) 10 MEQ tablet TAKE 1 TABLET BY MOUTH TWICE DAILY   rosuvastatin (CRESTOR) 40 MG tablet Take 1 tablet (40 mg total) by mouth daily.   sertraline (ZOLOFT) 100 MG tablet TAKE 1 AND 1/2 TABLETS BY MOUTH DAILY   valsartan (DIOVAN) 160 MG tablet Take 160 mg by mouth daily.   valsartan-hydrochlorothiazide (DIOVAN-HCT) 160-25 MG tablet Take 1 tablet by mouth  daily.   [DISCONTINUED] rosuvastatin (CRESTOR) 40 MG tablet Take 1 tablet (40 mg total) by mouth daily.   No facility-administered encounter medications on file as of 03/14/2022.    Allergies (verified) Apresoline [hydralazine] and Norvasc [amlodipine besylate]   History: Past Medical History:  Diagnosis Date   Hyperlipidemia    Hypertension    IDA (iron deficiency anemia)    Tubular adenoma of colon    Past Surgical History:  Procedure Laterality Date    ABDOMINAL HYSTERECTOMY     BIOPSY  03/26/2020   Procedure: BIOPSY;  Surgeon: Daneil Dolin, MD;  [facility];  Service: Endoscopy;;   COLONOSCOPY  06/30/2003   QIW:LNLG papilla/Otherwise,normal rectum/Pedunculated polyp at middescending colon/Smaller pedunculated polyp at hepatic flexure, cold snared   COLONOSCOPY   08/07/2006   XQJ:JHERDEY internal hemorrhoids, otherwise normal rectum/Diminutive ascending colon adenomatous polyps   COLONOSCOPY  08/03/2009   RMR: tubular adenomas removed from hepatic flexure & desc colon, internal hemorrhoids and anal papilla otherwise norma;   COLONOSCOPY N/A 10/03/2012   Procedure: COLONOSCOPY;  Surgeon: Daneil Dolin, MD;  [facility]: [facility];  Service: Endoscopy;  Laterality: N/A;  2:15   COLONOSCOPY N/A 01/17/2018   Three 4-7 mm polyps in descending colon and at hepatic flexure, s/p removal. Tubular adenomas. Surveillance in 2022.    COLONOSCOPY WITH PROPOFOL N/A 03/26/2020    one 10 mm polyp (tubular adenoma), otherwise normal, s/p segmental biopsies (benign)   POLYPECTOMY  01/17/2018   Procedure: POLYPECTOMY;  Surgeon: Daneil Dolin, MD;  [facility];  Service: Endoscopy;;  hepatic flexure x2;descending colon   POLYPECTOMY  03/26/2020   Procedure: POLYPECTOMY;  Surgeon: Daneil Dolin, MD;  [facility];  Service: Endoscopy;;   Family History  Problem Relation Age of Onset   Lung cancer Father    Breast cancer Mother 41   Colon cancer Neg Hx    Social History   Socioeconomic History   Marital status: Married    Spouse name: Wiliam   Number of children: 1   Years of education: Not on file   Highest education level: Not on file  Occupational History   Occupation: Interior and spatial designer Room    Employer: [employer]  Tobacco Use   Smoking status: Never   Smokeless tobacco: Never  Vaping Use   Vaping Use: Never used  Substance and Sexual Activity   Alcohol use: No   Drug use: No   Sexual activity: Yes     Birth control/protection: Surgical  Other Topics Concern   Not on file  Social History Narrative   Lives w/ husband. 1 daughter, 2 stepchildren. 5 grandchildren   Social Determinants of Health   Financial Resource Strain: Low Risk  (03/14/2022)   Overall Financial Resource Strain (CARDIA)    Difficulty of Paying Living Expenses: Not hard at all  Food Insecurity: No Food Insecurity (03/14/2022)   Hunger Vital Sign    Worried About Running Out of Food in the Last Year: Never true    Ran Out of Food in the Last Year: Never true  Transportation Needs: No Transportation Needs (03/14/2022)   PRAPARE - Hydrologist (Medical): No    Lack of Transportation (Non-Medical): No  Physical Activity: Inactive (03/14/2022)   Exercise Vital Sign    Days of Exercise per Week: 0 days    Minutes of Exercise per Session: 0 min  Stress: No Stress Concern Present (03/14/2022)   [place]  [employer] Health - Occupational Stress Questionnaire    Feeling of Stress : Only a little  Social Connections: Moderately Integrated (03/14/2022)   Social Connection and Isolation Panel [NHANES]    Frequency of Communication with Friends and Family: More than three times a week    Frequency of Social Gatherings with Friends and Family: Once a week    Attends Religious Services: More than 4 times per year    Active Member of [employer] or Organizations: No    Attends Music therapist: Never    Marital Status: Married    Tobacco Counseling Counseling given: Not Answered   Clinical Intake:  Pre-visit preparation completed: Yes  Pain : No/denies pain     Nutritional Risks: None Diabetes: Yes CBG done?: No Did pt. bring in CBG monitor from home?: No  How often do you need to have someone help you when you read instructions, pamphlets, or other written materials from your doctor or pharmacy?: 1 - Never  Diabetic?yes Nutrition Risk Assessment:  Has the  patient had any N/V/D within the last 2 months?  No  Does the patient have any non-healing wounds?  No  Has the patient had any unintentional weight loss or weight gain?  No   Diabetes:  Is the patient diabetic?  Yes  If diabetic, was a CBG obtained today?  No  Did the patient bring in their glucometer from home?  No  How often do you monitor your CBG's? never.   Financial Strains and Diabetes Management:  Are you having any financial strains with the device, your supplies or your medication? No .  Does the patient want to be seen by Chronic Care Management for management of their diabetes?  No  Would the patient like to be referred to a Nutritionist or for Diabetic Management?  No   Diabetic Exams:  Diabetic Eye Exam: Completed no. Overdue for diabetic eye exam. Pt has been advised about the importance in completing this exam.  Diabetic Foot Exam: Completed no. Pt has been advised about the importance in completing this exam.   Interpreter Needed?: No  Information entered by :: Kirke Shaggy, LPN   Activities of Daily Living    03/14/2022   11:33 AM  In your present state of health, do you have any difficulty performing the following activities:  Hearing? 0  Vision? 0  Difficulty concentrating or making decisions? 0  Walking or climbing stairs? 0  Dressing or bathing? 0  Doing errands, shopping? 0  Preparing Food and eating ? N  Using the Toilet? N  In the past six months, have you accidently leaked urine? N  Do you have problems with loss of bowel control? N  Managing your Medications? N  Managing your Finances? N  Housekeeping or managing your Housekeeping? N    Patient Care Team: Kathyrn Drown, MD as PCP - General (Family Medicine) Gala Romney Cristopher Estimable, MD as Consulting Physician (Gastroenterology)  Indicate any recent Medical Services you may have received from other than [facility] providers in the past year (date may be approximate).     Assessment:   This is a  routine wellness examination for Paiten.  Hearing/Vision screen Hearing Screening - Comments:: No aids Vision Screening - Comments:: Wears glasses- [facility] [place] issues and exercise activities discussed: Current Exercise Habits: The patient does not participate in regular exercise at present   Goals Addressed  This Visit's Progress    DIET - EAT MORE FRUITS AND VEGETABLES         Depression Screen    03/14/2022   11:31 AM 03/09/2021    3:01 PM 09/03/2020    2:19 PM 08/13/2020    2:04 PM 07/02/2020   10:47 AM 04/20/2020    9:54 AM 12/18/2019   10:58 AM  PHQ 2/9 Scores  PHQ - 2 Score 0 2 0 2 0 3 1  PHQ- 9 Score 0 '3  4  6 3    '$ Fall Risk    03/14/2022   11:33 AM 05/10/2021   11:42 AM 03/09/2021    3:08 PM 10/19/2020    1:07 PM 09/03/2020    2:18 PM  Fall Risk   Falls in the past year? 0 1 0 0 0  Number falls in past yr: 0 1 0 0 0  Comment  Patient states she had "slips"     Injury with Fall? 0 0 0 0 0  Risk for fall due to : No Fall Risks Impaired balance/gait Impaired vision No Fall Risks No Fall Risks  Follow up Falls prevention discussed;Falls evaluation completed Falls evaluation completed;Education provided Falls prevention discussed Falls evaluation completed Falls evaluation completed    FALL RISK PREVENTION PERTAINING TO THE HOME:  Any stairs in or around the home? No  If so, are there any without handrails? No  Home free of loose throw rugs in walkways, pet beds, electrical cords, etc? Yes  Adequate lighting in your home to reduce risk of falls? Yes   ASSISTIVE DEVICES UTILIZED TO PREVENT FALLS:  Life alert? No  Use of a cane, walker or w/c? No  Grab bars in the bathroom? Yes  Shower chair or bench in shower? Yes  Elevated toilet seat or a handicapped toilet? No    Cognitive Function:        03/14/2022   11:33 AM 03/09/2021    3:10 PM  6CIT Screen  What Year? 0 points 0 points  What month? 0 points 0 points  What  time? 0 points 0 points  Count back from 20 0 points 0 points  Months in reverse 0 points 0 points  Repeat phrase 0 points 0 points  Total Score 0 points 0 points    Immunizations Immunization History  Administered Date(s) Administered   Fluad Quad(high Dose 65+) 04/20/2020, 04/23/2021   Influenza, High Dose Seasonal PF 04/04/2019   Influenza-Unspecified 03/31/2011, 12/28/2012, 04/02/2019   Moderna Sars-Covid-2 Vaccination 07/07/2019, 08/07/2019, 05/04/2020   PNEUMOCOCCAL CONJUGATE-20 09/23/2021   Pneumococcal Polysaccharide-23 12/18/2019   Td 07/01/2011   Zoster Recombinat (Shingrix) 02/11/2020   Zoster, Live 04/09/2013    TDAP status: Due, Education has been provided regarding the importance of this vaccine. Advised may receive this vaccine at local pharmacy or Health Dept. Aware to provide a copy of the vaccination record if obtained from local pharmacy or Health Dept. Verbalized acceptance and understanding.  Flu Vaccine status: Up to date  Pneumococcal vaccine status: Up to date  Covid-19 vaccine status: Completed vaccines  Qualifies for Shingles Vaccine? Yes   Zostavax completed Yes   Shingrix Completed?: Yes  Screening Tests Health Maintenance  Topic Date Due   Zoster Vaccines- Shingrix (2 of 2) 04/07/2020   COVID-19 Vaccine (4 - Moderna risk series) 06/29/2020   TETANUS/TDAP  06/30/2021   INFLUENZA VACCINE  12/28/2021   Diabetic kidney evaluation - Urine ACR  09/21/2022   Diabetic kidney evaluation - GFR measurement  11/16/2022   MAMMOGRAM  12/16/2022   COLONOSCOPY (Pts 45-38yr Insurance coverage will need to be confirmed)  03/27/2023   Pneumonia Vaccine 69 Years old  Completed   DEXA SCAN  Completed   Hepatitis C Screening  Completed   HPV VACCINES  Aged Out    Health Maintenance  Health Maintenance Due  Topic Date Due   Zoster Vaccines- Shingrix (2 of 2) 04/07/2020   COVID-19 Vaccine (4 - Moderna risk series) 06/29/2020   TETANUS/TDAP  06/30/2021    INFLUENZA VACCINE  12/28/2021    Colorectal cancer screening: Type of screening: Colonoscopy. Completed 03/26/20. Repeat every 3 years  Mammogram status: Completed 12/15/21. Repeat every year  Bone Density status: Completed 09/28/16. Results reflect: Bone density results: NORMAL. Repeat every 5 years.- referral sent  Lung Cancer Screening: (Low Dose CT Chest recommended if Age 69-80years, 30 pack-year currently smoking OR have quit w/in 15years.) does not qualify.    Additional Screening:  Hepatitis C Screening: does qualify; Completed 06/23/15  Vision Screening: Recommended annual ophthalmology exams for early detection of glaucoma and other disorders of the eye. Is the patient up to date with their annual eye exam?  Yes  Who is the provider or what is the name of the office in which the patient attends annual eye exams[place] [facility] [place] not established with a provider, would they like to be referred to a provider to establish care? No .   Dental Screening: Recommended annual dental exams for proper oral hygiene  Community Resource Referral / Chronic Care Management: CRR required this visit?  No   CCM required this visit?  No      Plan:     I have personally reviewed and noted the following in the patient's chart:   Medical and social history Use of alcohol, tobacco or illicit drugs  Current medications and supplements including opioid prescriptions. Patient is not currently taking opioid prescriptions. Functional ability and status Nutritional status Physical activity Advanced directives List of other physicians Hospitalizations, surgeries, and ER visits in previous 12 months Vitals Screenings to include cognitive, depression, and falls Referrals and appointments  In addition, I have reviewed and discussed with patient certain preventive protocols, quality metrics, and best practice recommendations. A written personalized care plan for preventive services  as well as general preventive health recommendations were provided to patient.     LDionisio David LPN   [id number]  Nurse Notes: none

## 2022-03-24 ENCOUNTER — Other Ambulatory Visit: Payer: Self-pay | Admitting: Family Medicine

## 2022-04-22 ENCOUNTER — Other Ambulatory Visit: Payer: Self-pay | Admitting: Family Medicine

## 2022-04-25 ENCOUNTER — Telehealth: Payer: Medicare Other | Admitting: Physician Assistant

## 2022-04-25 ENCOUNTER — Other Ambulatory Visit: Payer: Self-pay | Admitting: Gastroenterology

## 2022-04-25 DIAGNOSIS — R3989 Other symptoms and signs involving the genitourinary system: Secondary | ICD-10-CM | POA: Diagnosis not present

## 2022-04-25 MED ORDER — CEPHALEXIN 500 MG PO CAPS: 500.0000 mg | ORAL_CAPSULE | Freq: Two times a day (BID) | ORAL | 0 refills | Status: DC

## 2022-04-25 NOTE — Progress Notes (Signed)

## 2022-04-27 LAB — HM DIABETES EYE EXAM

## 2022-05-09 ENCOUNTER — Other Ambulatory Visit: Payer: Self-pay | Admitting: Family Medicine

## 2022-05-10 NOTE — Telephone Encounter (Signed)
May have 90 days patient needs to schedule follow-up visit

## 2022-05-16 ENCOUNTER — Other Ambulatory Visit: Payer: Self-pay | Admitting: Family Medicine

## 2022-05-17 ENCOUNTER — Other Ambulatory Visit: Payer: Self-pay

## 2022-05-17 MED ORDER — NIFEDIPINE ER OSMOTIC RELEASE 30 MG PO TB24: ORAL_TABLET | ORAL | 1 refills | Status: DC

## 2022-05-26 IMAGING — MG MM DIGITAL SCREENING BILAT W/ TOMO AND CAD
6 of 12 series · 6 of 36 positions shown · non-contrast
Comparison: Previous exam(s).

CLINICAL DATA: Screening.

EXAM:
DIGITAL SCREENING BILATERAL MAMMOGRAM WITH TOMOSYNTHESIS AND CAD
TECHNIQUE: Bilateral screening digital craniocaudal and mediolateral oblique
mammograms were obtained. Bilateral screening digital breast
tomosynthesis was performed. The images were evaluated with
computer-aided detection.

[R MLO synth-2D (1 of 2)]
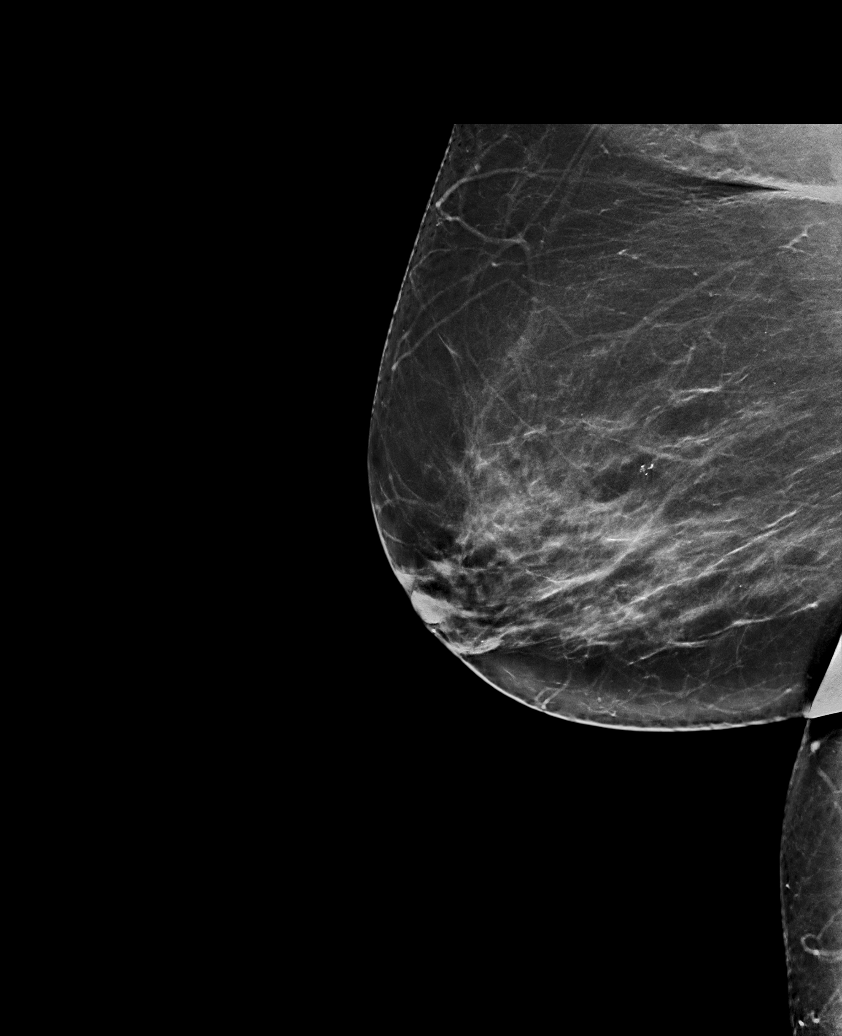

[L CC synth-2D]
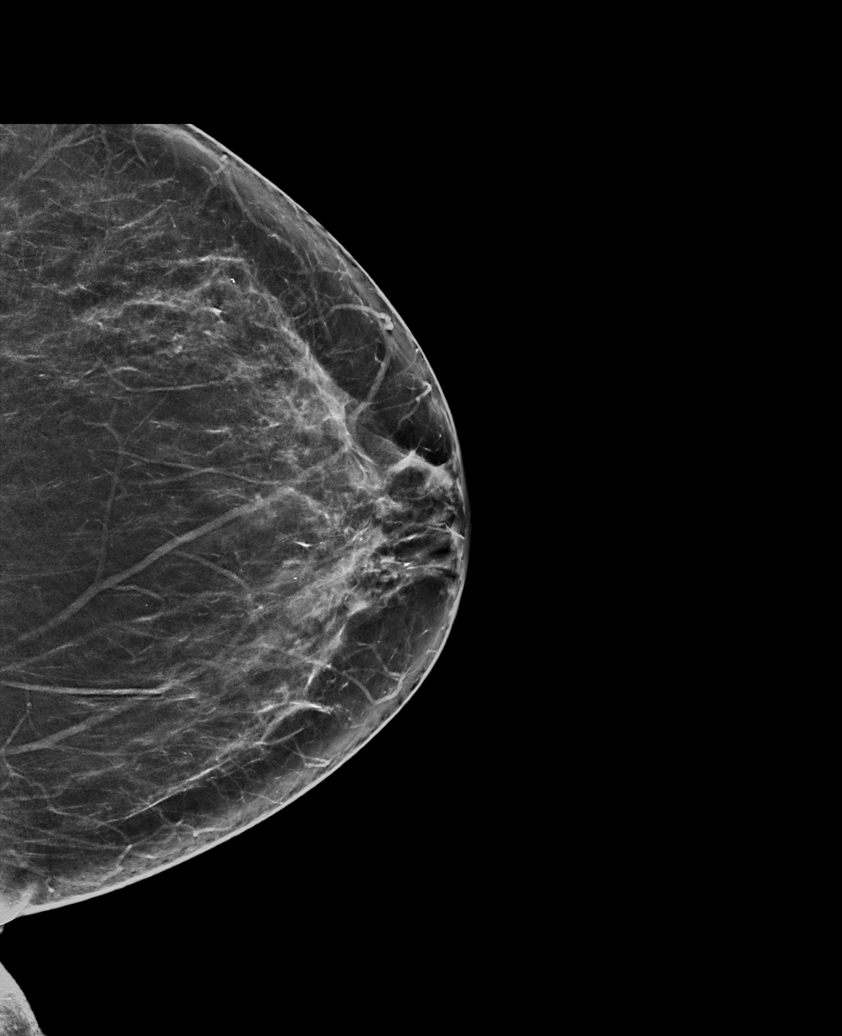

[R CC synth-2D]
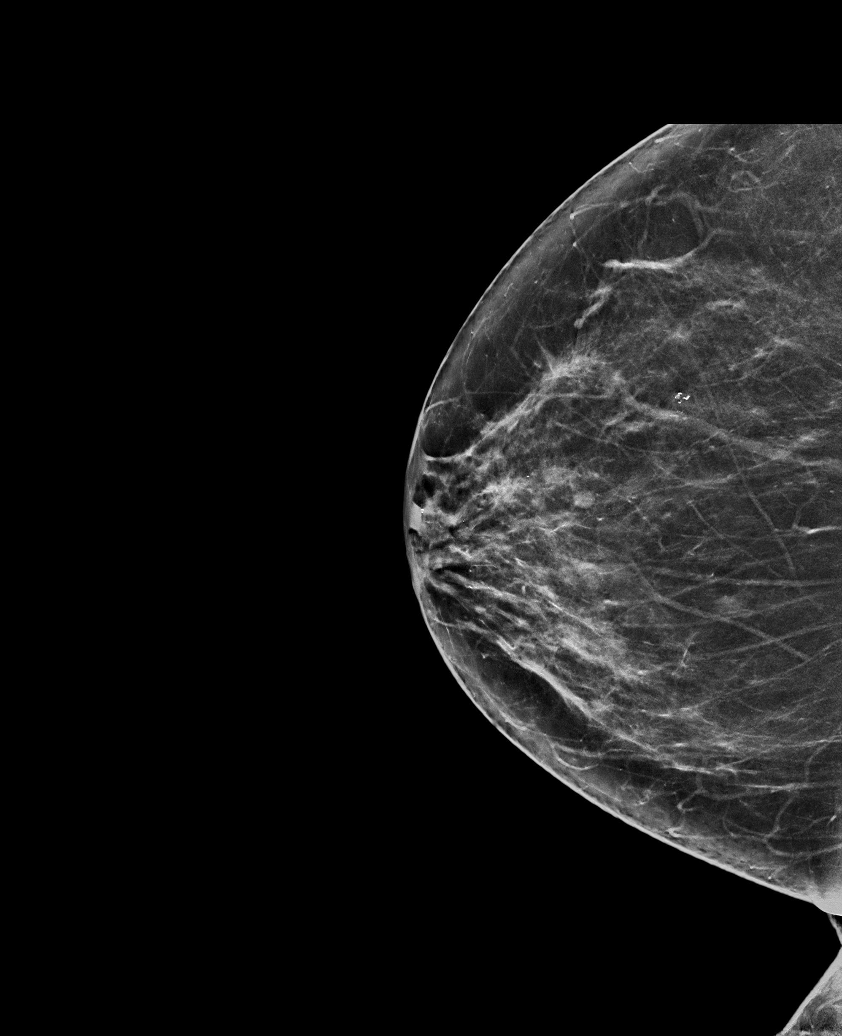

[L MLO synth-2D (1 of 2)]
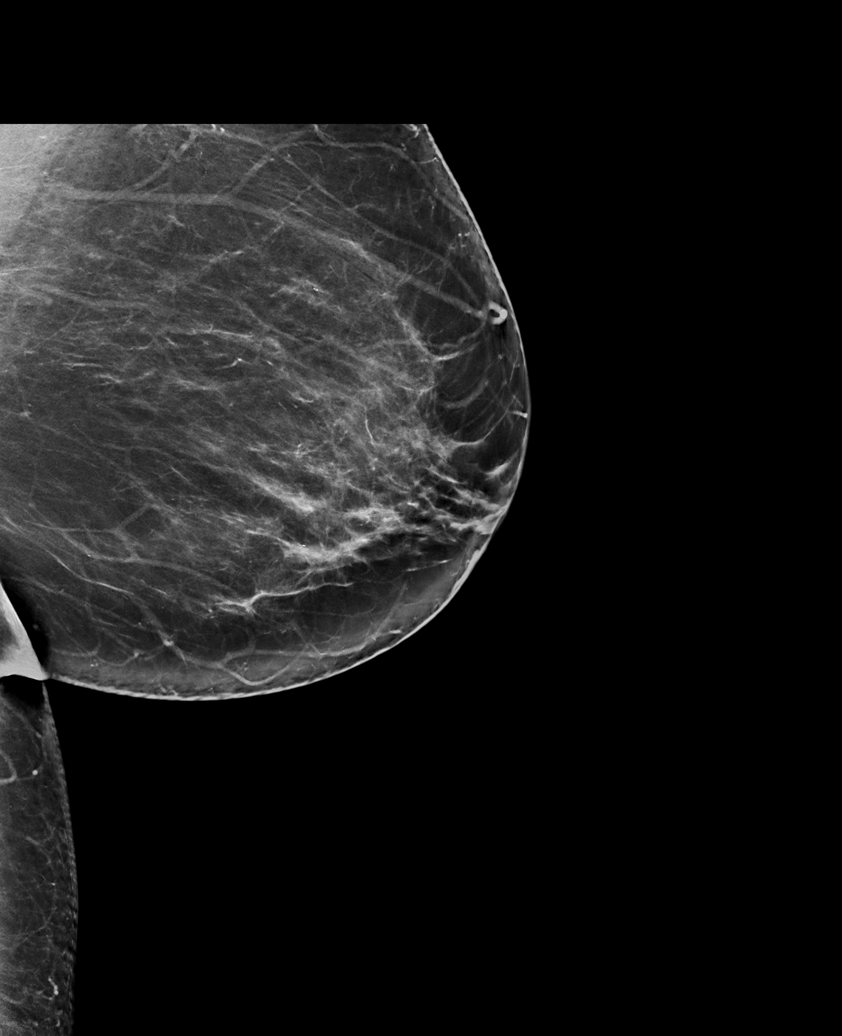

[R MLO synth-2D (2 of 2)]
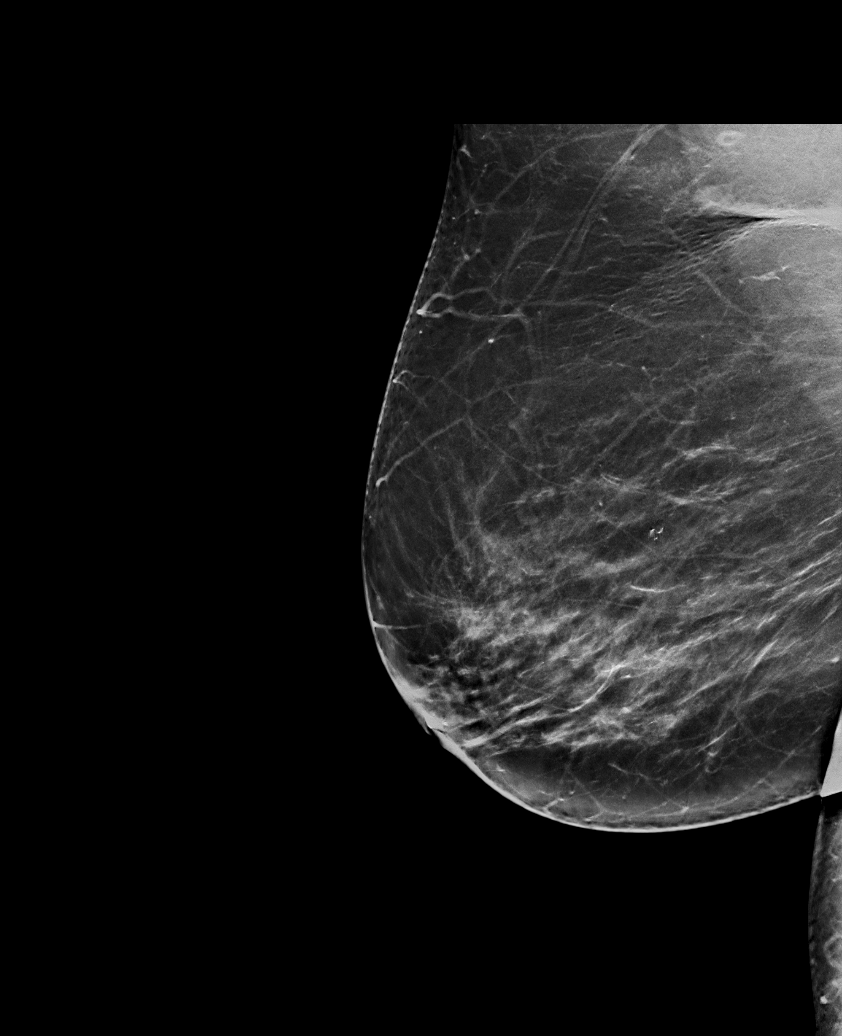

[L MLO synth-2D (2 of 2)]
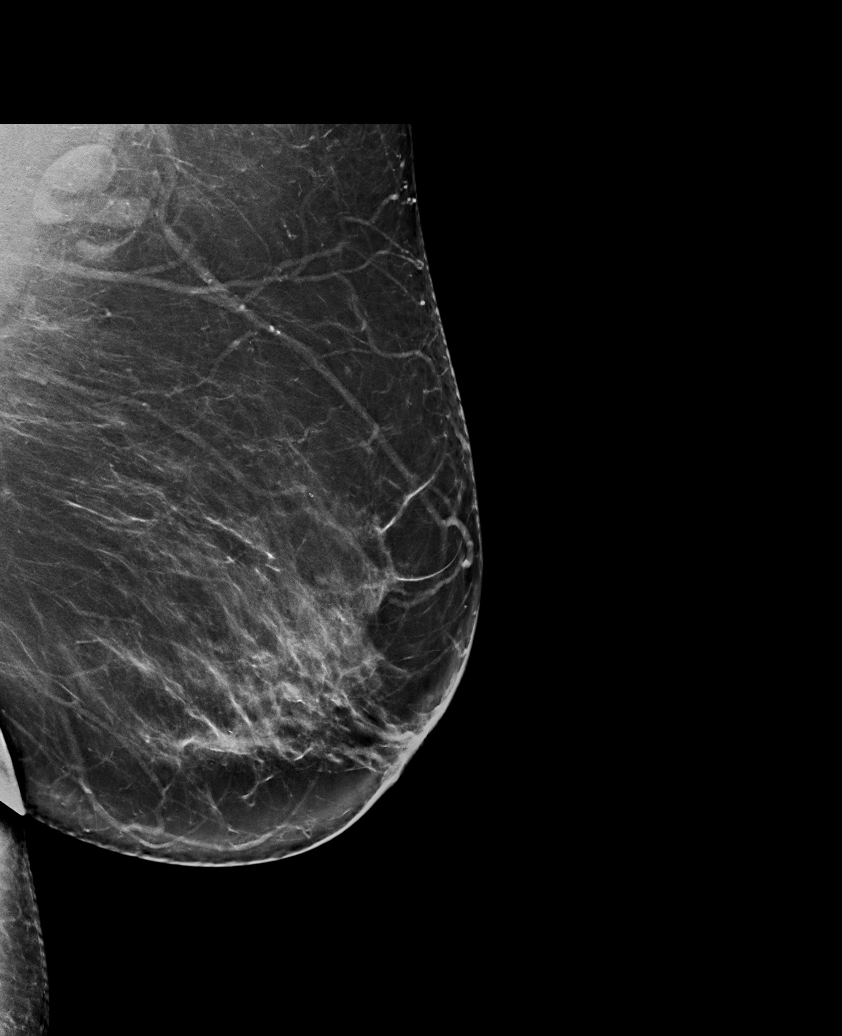

[6 of 36 positions shown; findings below may reference images not displayed]

ACR Breast Density Category b: There are scattered areas of
fibroglandular density.
FINDINGS: There are no findings suspicious for malignancy.
IMPRESSION: No mammographic evidence of malignancy. A result letter of this
screening mammogram will be mailed directly to the patient.

RECOMMENDATION:
Screening mammogram in one year. (Code:51-O-LD2)

BI-RADS CATEGORY  1: Negative.

## 2022-06-15 ENCOUNTER — Encounter: Payer: Self-pay | Admitting: *Deleted

## 2022-06-16 ENCOUNTER — Other Ambulatory Visit: Payer: Self-pay | Admitting: Family Medicine

## 2022-06-16 NOTE — Telephone Encounter (Signed)
Patient is due for a lipid, liver, metabolic 7, D2W Diabetes hypertension hyperlipidemia Also go ahead and schedule her for an office visit no later than February Once this is scheduled she may have a 90-day refill on this medicine

## 2022-06-28 ENCOUNTER — Telehealth: Payer: Self-pay

## 2022-06-28 ENCOUNTER — Other Ambulatory Visit: Payer: Self-pay | Admitting: Family Medicine

## 2022-06-28 DIAGNOSIS — E1169 Type 2 diabetes mellitus with other specified complication: Secondary | ICD-10-CM

## 2022-06-28 DIAGNOSIS — E119 Type 2 diabetes mellitus without complications: Secondary | ICD-10-CM

## 2022-06-28 DIAGNOSIS — I10 Essential (primary) hypertension: Secondary | ICD-10-CM

## 2022-06-28 DIAGNOSIS — E876 Hypokalemia: Secondary | ICD-10-CM

## 2022-06-28 MED ORDER — SERTRALINE HCL 100 MG PO TABS: 150.0000 mg | ORAL_TABLET | Freq: Every day | ORAL | 1 refills | Status: DC

## 2022-06-28 NOTE — Telephone Encounter (Signed)
Encourage patient to contact the pharmacy for refills or they can request refills through [facility]  (Please schedule appointment if patient has not been seen in over a year)    WHAT PHARMACY WOULD THEY LIKE THIS SENT TO: [facility] [address]   MEDICATION NAME & DOSE:sertraline (ZOLOFT) 100 MG tablet   NOTES/COMMENTS FROM PATIENT:      [place] office please notify patient: It takes 48-72 hours to process rx refill requests Ask patient to call pharmacy to ensure rx is ready before heading there.

## 2022-06-28 NOTE — Telephone Encounter (Signed)
Nurses May go ahead with 30-day refill with 1 additional refill She needs to do her lab work She needs to do a follow-up office visit(every 6 months with chronic health issues) thank you

## 2022-06-28 NOTE — Telephone Encounter (Signed)
Please contact pt to have her schedule follow up for February. Labs have been placed. Thank you!

## 2022-06-28 NOTE — Telephone Encounter (Signed)
Telephone call would not go through x2

## 2022-06-28 NOTE — Addendum Note (Signed)
Addended by: Orvan Seen on: 06/28/2022 05:17 PM   Modules accepted: Orders

## 2022-06-28 NOTE — Telephone Encounter (Signed)
Last seen 09/23/21 for HTN. Please advise. Thank you

## 2022-06-28 NOTE — Telephone Encounter (Signed)
Refill sent to pharmacy.   

## 2022-06-29 NOTE — Telephone Encounter (Signed)
Sent my chart message to schedule appointment 06/29/22

## 2022-07-07 NOTE — Telephone Encounter (Signed)
Second request to schedule medication follow up 07/07/22

## 2022-07-13 ENCOUNTER — Encounter: Payer: Self-pay | Admitting: Family Medicine

## 2022-07-13 NOTE — Telephone Encounter (Signed)
Letter is being mailed to the patient thank you

## 2022-07-13 NOTE — Progress Notes (Signed)
Please mail this to the patient thank you

## 2022-08-09 ENCOUNTER — Telehealth: Payer: Self-pay

## 2022-08-09 NOTE — Telephone Encounter (Signed)
Prescription Request  08/09/2022  LOV: Visit date not found  What is the name of the medication or equipment? sertraline (ZOLOFT) 100 MG tablet   Have you contacted your pharmacy to request a refill? Yes   [id number] - [place], [address] [place] pharmacy would you like this sent to?  Patient notified that their request is being sent to the clinical staff for review and that they should receive a response within 2 business days.   Please advise at Mobile [phone] (mobile)

## 2022-08-11 MED ORDER — SERTRALINE HCL 100 MG PO TABS: 150.0000 mg | ORAL_TABLET | Freq: Every day | ORAL | 1 refills | Status: DC

## 2022-08-11 NOTE — Telephone Encounter (Signed)
Prescription sent electronically to pharmacy. Patient notified. 

## 2022-09-23 ENCOUNTER — Telehealth: Payer: Self-pay | Admitting: Family Medicine

## 2022-09-23 ENCOUNTER — Other Ambulatory Visit: Payer: Self-pay | Admitting: Family Medicine

## 2022-09-23 MED ORDER — METFORMIN HCL 500 MG PO TABS: ORAL_TABLET | ORAL | 0 refills | Status: DC

## 2022-09-23 NOTE — Telephone Encounter (Signed)
Received via fax Rx request: Prescription sent electronically to pharmacy  

## 2022-09-23 NOTE — Telephone Encounter (Signed)
Refill on  metFORMIN (GLUCOPHAGE) 500 MG tablet  [facility]

## 2022-09-24 ENCOUNTER — Other Ambulatory Visit: Payer: Self-pay | Admitting: Family Medicine

## 2022-09-26 ENCOUNTER — Telehealth: Payer: Self-pay | Admitting: Family Medicine

## 2022-09-26 NOTE — Telephone Encounter (Signed)
Received via fax Rx request: Prescription sent electronically to pharmacy 09/23/22

## 2022-09-26 NOTE — Telephone Encounter (Signed)
Refill on metFORMIN (GLUCOPHAGE) 500 MG tablet [facility] [address]

## 2022-10-18 ENCOUNTER — Other Ambulatory Visit: Payer: Self-pay | Admitting: Family Medicine

## 2022-10-22 ENCOUNTER — Other Ambulatory Visit: Payer: Self-pay | Admitting: Family Medicine

## 2022-10-24 ENCOUNTER — Other Ambulatory Visit: Payer: Self-pay | Admitting: Family Medicine

## 2022-10-31 ENCOUNTER — Ambulatory Visit (HOSPITAL_COMMUNITY)
Admission: RE | Admit: 2022-10-31 | Discharge: 2022-10-31 | Disposition: A | Discharge status: Home / Self Care | Payer: Medicare Other | Source: Ambulatory Visit | Attending: Family Medicine | Admitting: Family Medicine

## 2022-10-31 ENCOUNTER — Other Ambulatory Visit: Payer: Self-pay | Admitting: Internal Medicine

## 2022-10-31 ENCOUNTER — Ambulatory Visit (INDEPENDENT_AMBULATORY_CARE_PROVIDER_SITE_OTHER): Payer: Medicare Other | Admitting: Family Medicine

## 2022-10-31 VITALS — BP 125/76 | HR 76 | Ht 62.5 in | Wt 210.6 lb

## 2022-10-31 DIAGNOSIS — E785 Hyperlipidemia, unspecified: Secondary | ICD-10-CM | POA: Diagnosis not present

## 2022-10-31 DIAGNOSIS — M25552 Pain in left hip: Secondary | ICD-10-CM | POA: Insufficient documentation

## 2022-10-31 DIAGNOSIS — E1169 Type 2 diabetes mellitus with other specified complication: Secondary | ICD-10-CM

## 2022-10-31 DIAGNOSIS — I10 Essential (primary) hypertension: Secondary | ICD-10-CM | POA: Diagnosis not present

## 2022-10-31 DIAGNOSIS — E119 Type 2 diabetes mellitus without complications: Secondary | ICD-10-CM

## 2022-10-31 DIAGNOSIS — E876 Hypokalemia: Secondary | ICD-10-CM | POA: Diagnosis not present

## 2022-10-31 MED ORDER — NIFEDIPINE ER OSMOTIC RELEASE 30 MG PO TB24: ORAL_TABLET | ORAL | 1 refills | Status: DC

## 2022-10-31 MED ORDER — METFORMIN HCL 500 MG PO TABS: ORAL_TABLET | ORAL | 1 refills | Status: DC

## 2022-10-31 MED ORDER — VALSARTAN-HYDROCHLOROTHIAZIDE 160-25 MG PO TABS: 1.0000 | ORAL_TABLET | Freq: Every day | ORAL | 1 refills | Status: DC

## 2022-10-31 MED ORDER — CLONIDINE HCL 0.3 MG PO TABS: ORAL_TABLET | ORAL | 1 refills | Status: DC

## 2022-10-31 MED ORDER — SERTRALINE HCL 100 MG PO TABS: ORAL_TABLET | ORAL | 1 refills | Status: DC

## 2022-10-31 MED ORDER — ROSUVASTATIN CALCIUM 40 MG PO TABS: ORAL_TABLET | ORAL | 1 refills | Status: DC

## 2022-10-31 MED ORDER — PANTOPRAZOLE SODIUM 40 MG PO TBEC: DELAYED_RELEASE_TABLET | ORAL | 1 refills | Status: DC

## 2022-10-31 NOTE — Progress Notes (Signed)
   Subjective:    Patient ID: Aimee Dixon, female    DOB: 01-25-1951, 72 y.o.   MRN: [id number]  HPI Patient arrives today for pain on left side hip. Patient states that walking and standing aggravates her hip causing pain. Sitting does not seem to bother her hip. Patient states pain has been going on for a month.  Fell at [employer] on left side Sx present for years  Left hip pain - Plan: DG HIP UNILAT WITH PELVIS 2-3 VIEWS LEFT, CANCELED: DG Hip Unilat W OR W/O Pelvis Min 4 Views Left  HTN (hypertension), benign  Hypokalemia  Hyperlipidemia associated with type 2 diabetes mellitus (HCC)  Type 2 diabetes mellitus without complication, without long-term current use of insulin (HCC)  Patient with blood pressure issues takes her medicine tries to watch her diet The patient was seen today as part of a comprehensive diabetic check up. Patient has diabetes Patient relates good compliance with taking the medication. We discussed their diet and exercise activities  We also discussed the importance of notifying us if any excessively high glucoses or low sugars.    Patient for blood pressure check up.  The patient does have hypertension.   Patient relates dietary measures try to minimize salt The importance of healthy diet and activity were discussed Patient relates compliance  Patient here for follow-up regarding cholesterol.    Patient relates taking medication on a regular basis Denies problems with medication Importance of dietary measures discussed Regular lab work regarding lipid and liver was checked and if needing additional labs was appropriately ordered  Review of Systems     Objective:   Physical Exam General-in no acute distress Eyes-no discharge Lungs-respiratory rate normal, CTA CV-no murmurs,RRR Extremities skin warm dry no edema Neuro grossly normal Behavior normal, alert Hip pain       Assessment & Plan:  1. Left hip pain X-rays await the  results Increased pain discomfort with internal rotation more than likely has hip arthritis - DG HIP UNILAT WITH PELVIS 2-3 VIEWS LEFT  2. HTN (hypertension), benign HTN- patient seen for follow-up regarding HTN.   Diet, medication compliance, appropriate labs and refills were completed.   Importance of keeping blood pressure under good control to lessen the risk of complications discussed Regular follow-up visits discussed   3. Hypokalemia Check lab work patient already has labs ordered she needs to get these done she states she will this week or next week  4. Hyperlipidemia associated with type 2 diabetes mellitus (HCC) Continue cholesterol medicine check lab work Hyperlipidemia-importance of diet, weight control, activity, compliance with medications discussed.   Recent labs reviewed.   Any additional labs or refills ordered.   Importance of keeping under good control discussed. Regular follow-up visits discussed   5. Type 2 diabetes mellitus without complication, without long-term current use of insulin (HCC) The patient was seen today as part of a comprehensive visit for diabetes. The importance of keeping her A1c at or below 7 range was discussed.  Discussed diet, activity, and medication compliance Emphasized healthy eating primarily with vegetables fruits and if utilizing meats lean meats such as chicken or fish grilled baked broiled Avoid sugary drinks Minimize and avoid processed foods Fit in regular physical activity preferably 25 to 30 minutes 4 times per week Standard follow-up visit recommended.  Patient aware lack of control and follow-up increases risk of diabetic complications. Regular follow-up visits Yearly ophthalmology Yearly foot exam

## 2022-11-04 ENCOUNTER — Other Ambulatory Visit: Payer: Self-pay

## 2022-11-04 DIAGNOSIS — M25552 Pain in left hip: Secondary | ICD-10-CM

## 2022-11-10 DIAGNOSIS — I10 Essential (primary) hypertension: Secondary | ICD-10-CM | POA: Diagnosis not present

## 2022-11-10 DIAGNOSIS — E119 Type 2 diabetes mellitus without complications: Secondary | ICD-10-CM | POA: Diagnosis not present

## 2022-11-10 DIAGNOSIS — E1169 Type 2 diabetes mellitus with other specified complication: Secondary | ICD-10-CM | POA: Diagnosis not present

## 2022-11-10 DIAGNOSIS — E876 Hypokalemia: Secondary | ICD-10-CM | POA: Diagnosis not present

## 2022-11-10 DIAGNOSIS — E785 Hyperlipidemia, unspecified: Secondary | ICD-10-CM | POA: Diagnosis not present

## 2022-11-11 ENCOUNTER — Encounter: Payer: Self-pay | Admitting: Family Medicine

## 2022-11-11 LAB — BASIC METABOLIC PANEL
BUN/Creatinine Ratio: 18 (ref 12–28)
BUN: 14 mg/dL (ref 8–27)
CO2: 30 mmol/L — ABNORMAL HIGH (ref 20–29)
Calcium: 10.3 mg/dL (ref 8.7–10.3)
Chloride: 99 mmol/L (ref 96–106)
Creatinine, Ser: 0.78 mg/dL (ref 0.57–1.00)
Glucose: 128 mg/dL — ABNORMAL HIGH (ref 70–99)
Potassium: 3.1 mmol/L — ABNORMAL LOW (ref 3.5–5.2)
Sodium: 143 mmol/L (ref 134–144)
eGFR: 82 mL/min/{1.73_m2} (ref >59)

## 2022-11-11 LAB — HEPATIC FUNCTION PANEL
ALT: 16 IU/L (ref 0–32)
AST: 24 IU/L (ref 0–40)
Albumin: 4.6 g/dL (ref 3.9–4.9)
Alkaline Phosphatase: 91 IU/L (ref 44–121)
Bilirubin Total: 0.4 mg/dL (ref 0.0–1.2)
Bilirubin, Direct: 0.14 mg/dL (ref 0.00–0.40)
Total Protein: 7.2 g/dL (ref 6.0–8.5)

## 2022-11-11 LAB — LIPID PANEL
Chol/HDL Ratio: 2.9 ratio (ref 0.0–4.4)
Cholesterol, Total: 123 mg/dL (ref 100–199)
HDL: 42 mg/dL
LDL Chol Calc (NIH): 60 mg/dL (ref 0–99)
Triglycerides: 119 mg/dL (ref 0–149)
VLDL Cholesterol Cal: 21 mg/dL (ref 5–40)

## 2022-11-11 LAB — HEMOGLOBIN A1C
Est. average glucose Bld gHb Est-mCnc: 151 mg/dL
Hgb A1c MFr Bld: 6.9 % — ABNORMAL HIGH (ref 4.8–5.6)

## 2022-11-11 NOTE — Progress Notes (Signed)
Please mail to patient

## 2022-11-18 ENCOUNTER — Ambulatory Visit (INDEPENDENT_AMBULATORY_CARE_PROVIDER_SITE_OTHER): Payer: Medicare Other | Admitting: Orthopedic Surgery

## 2022-11-18 ENCOUNTER — Encounter: Payer: Self-pay | Admitting: Orthopedic Surgery

## 2022-11-18 VITALS — BP 176/95 | HR 84 | Ht 62.0 in | Wt 210.0 lb

## 2022-11-18 DIAGNOSIS — M1612 Unilateral primary osteoarthritis, left hip: Secondary | ICD-10-CM | POA: Diagnosis not present

## 2022-11-18 NOTE — Patient Instructions (Signed)
Physical therapy has been ordered for you at [place]. They should call you to schedule, [phone] is the phone number to call, if you want to call to schedule.   

## 2022-11-18 NOTE — Progress Notes (Signed)
Chief Complaint  Patient presents with   Hip Pain    Left hip pain x3 months  had fall but pain was there before  stays in groin area    72 year old female with left groin pain worsened after a fall 3 months ago.  She started Aleve twice a day after seeing her primary care doctor and x-rays revealed that she had some arthritis of the hip  She says it does help  She is has some trouble with some of her activities such as getting dressed and she has to turn her foot and external rotation which is typical of patients with arthritic hip  Past Medical History:  Diagnosis Date   Hyperlipidemia    Hypertension    IDA (iron deficiency anemia)    Tubular adenoma of colon    Past Surgical History:  Procedure Laterality Date   ABDOMINAL HYSTERECTOMY     BIOPSY  03/26/2020   Procedure: BIOPSY;  Surgeon: Corbin Ade, MD;  [facility];  Service: Endoscopy;;   COLONOSCOPY  06/30/2003   ZOX:WRUE papilla/Otherwise,normal rectum/Pedunculated polyp at middescending colon/Smaller pedunculated polyp at hepatic flexure, cold snared   COLONOSCOPY   08/07/2006   AVW:[id number] internal hemorrhoids, otherwise normal rectum/Diminutive ascending colon adenomatous polyps   COLONOSCOPY  08/03/2009   RMR: tubular adenomas removed from hepatic flexure & desc colon, internal hemorrhoids and anal papilla otherwise norma;   COLONOSCOPY N/A 10/03/2012   Procedure: COLONOSCOPY;  Surgeon: Corbin Ade, MD;  [facility];  Service: Endoscopy;  Laterality: N/A;  2:15   COLONOSCOPY N/A 01/17/2018   Three 4-7 mm polyps in descending colon and at hepatic flexure, s/p removal. Tubular adenomas. Surveillance in 2022.    COLONOSCOPY WITH PROPOFOL N/A 03/26/2020    one 10 mm polyp (tubular adenoma), otherwise normal, s/p segmental biopsies (benign)   POLYPECTOMY  01/17/2018   Procedure: POLYPECTOMY;  Surgeon: Corbin Ade, MD;  [facility];  Service: Endoscopy;;  hepatic flexure  x2;descending colon   POLYPECTOMY  03/26/2020   Procedure: POLYPECTOMY;  Surgeon: Corbin Ade, MD;  [facility];  Service: Endoscopy;;   Social History   Tobacco Use   Smoking status: Never   Smokeless tobacco: Never  Vaping Use   Vaping Use: Never used  Substance Use Topics   Alcohol use: No   Drug use: No   Current Outpatient Medications  Medication Instructions   acetaminophen (TYLENOL) 1,000 mg, Oral, As needed   albuterol (VENTOLIN HFA) 108 (90 Base) MCG/ACT inhaler 2 puffs, Inhalation, Every 4 hours PRN   azelastine (ASTELIN) 0.1 % nasal spray 2 sprays, Each Nare, 2 times daily   cetirizine (ZYRTEC) 10 mg, Oral, Daily   cloNIDine (CATAPRES) 0.3 MG tablet TAKE 1 TABLET(0.3 MG) BY MOUTH TWICE DAILY   dicyclomine (BENTYL) 10 MG capsule TAKE 1 CAPSULE(10 MG) BY MOUTH FOUR TIMES DAILY BEFORE MEALS AND AT BEDTIME   fexofenadine (ALLEGRA) 180 mg, Daily   guaiFENesin (MUCINEX) 600 mg, Oral, Daily   metFORMIN (GLUCOPHAGE) 500 MG tablet 1 qd   Multiple Vitamins-Minerals (MULTIVITAMIN WITH MINERALS) tablet 1 tablet, Oral, Daily   NIFEdipine (PROCARDIA-XL/NIFEDICAL-XL) 30 MG 24 hr tablet TAKE 1 TABLET(30 MG) BY MOUTH DAILY   pantoprazole (PROTONIX) 40 MG tablet TAKE 1 TABLET(40 MG) BY MOUTH DAILY   potassium chloride (KLOR-CON M) 10 MEQ tablet TAKE 1 TABLET BY MOUTH TWICE DAILY   rosuvastatin (CRESTOR) 40 MG tablet TAKE 1 TABLET(40 MG) BY MOUTH DAILY  sertraline (ZOLOFT) 100 MG tablet TAKE 1 AND 1/2 TABLETS(150 MG) BY MOUTH DAILY   valsartan-hydrochlorothiazide (DIOVAN-HCT) 160-25 MG tablet 1 tablet, Oral, Daily   Allergies  Allergen Reactions   Apresoline [Hydralazine] Hives and Itching   Norvasc [Amlodipine Besylate] Other (See Comments)    Gums bleed    BP (!) 176/95   Pulse 84   Ht 5\' 2"  (1.575 m)   Wt 210 lb (95.3 kg)   BMI 38.41 kg/m   Physical Exam Vitals and nursing note reviewed.  Constitutional:      Appearance: Normal appearance.  HENT:      Head: Normocephalic and atraumatic.  Eyes:     General: No scleral icterus.       Right eye: No discharge.        Left eye: No discharge.     Extraocular Movements: Extraocular movements intact.     Conjunctiva/sclera: Conjunctivae normal.     Pupils: Pupils are equal, round, and reactive to light.  Cardiovascular:     Rate and Rhythm: Normal rate.     Pulses: Normal pulses.  Musculoskeletal:     Comments: Left hip examination  Leg lengths equal.  Patient has external rotation when the left hip goes up into flexion she has pain with rotation of the hip all in the upper anterior and anterolateral hip and groin area.  Hip flexion is approximate 115 degrees.  Right hip no pain with range of motion  Skin:    General: Skin is warm and dry.     Capillary Refill: Capillary refill takes less than 2 seconds.  Neurological:     General: No focal deficit present.     Mental Status: She is alert and oriented to person, place, and time.  Psychiatric:        Mood and Affect: Mood normal.        Behavior: Behavior normal.        Thought Content: Thought content normal.        Judgment: Judgment normal.    Outside imaging was performed  Images were independently reviewed.  AP pelvis AP lateral left hip  Mild degenerative changes are seen in the left hip  Assessment and plan 72 year old female with mild arthritis of the left hip.  Recommend nonoperative treatment at this time to include:  Patient is advised to continue with Aleve twice a day, light exercise, physical therapy, x-ray in 6 months to check on the joint space narrowing.

## 2022-12-23 ENCOUNTER — Other Ambulatory Visit: Payer: Self-pay | Admitting: Family Medicine

## 2022-12-26 ENCOUNTER — Telehealth: Payer: Self-pay | Admitting: *Deleted

## 2022-12-26 NOTE — Telephone Encounter (Signed)
Patient stated she is having her once a year flare of eczema on her neck and arms and is requesting  a cream be sent to [facility] on [address]

## 2022-12-26 NOTE — Telephone Encounter (Signed)
Triamcinolone 0.1% cream, 45 g tube, apply thin amount twice daily as needed, 1 refill

## 2022-12-27 MED ORDER — TRIAMCINOLONE ACETONIDE 0.1 % EX CREA: 1.0000 | TOPICAL_CREAM | Freq: Two times a day (BID) | CUTANEOUS | 1 refills | Status: DC

## 2022-12-27 NOTE — Telephone Encounter (Signed)
Sent script to pharmacy, patient notified via MyChart.

## 2023-01-11 ENCOUNTER — Telehealth: Payer: Self-pay

## 2023-01-11 ENCOUNTER — Telehealth (INDEPENDENT_AMBULATORY_CARE_PROVIDER_SITE_OTHER): Payer: Medicare Other | Admitting: Family Medicine

## 2023-01-11 DIAGNOSIS — U071 COVID-19: Secondary | ICD-10-CM | POA: Diagnosis not present

## 2023-01-11 MED ORDER — NIRMATRELVIR/RITONAVIR (PAXLOVID)TABLET: 3.0000 | ORAL_TABLET | Freq: Two times a day (BID) | ORAL | 0 refills | Status: AC

## 2023-01-11 NOTE — Telephone Encounter (Signed)
Tested positive for covid yesterday and wants something called in   [place] on [address]   [phone]

## 2023-01-11 NOTE — Progress Notes (Addendum)
   Subjective:    Patient ID: Aimee Dixon, female    DOB: 12-Feb-1951, 72 y.o.   MRN: [id number]  HPI Virtual Visit via Video Note  I connected with Aimee Dixon on 01/28/23 at  3:30 PM EDT by a video enabled telemedicine application and verified that I am speaking with the correct person using two identifiers.  Location: Patient: Home Provider: Office   I discussed the limitations of evaluation and management by telemedicine and the availability of in person appointments. The patient expressed understanding and agreed to proceed.  History of Present Illness:    Observations/Objective:   Assessment and Plan:   Follow Up Instructions:    I discussed the assessment and treatment plan with the patient. The patient was provided an opportunity to ask questions and all were answered. The patient agreed with the plan and demonstrated an understanding of the instructions.   The patient was advised to call back or seek an in-person evaluation if the symptoms worsen or if the condition fails to improve as anticipated.  I provided 15 minutes of non-face-to-face time during this encounter.   Lilyan Punt, MD  Covid positive experienced fevers 2 days ago Monday tested Tuesday , body aches , headaches, nasal congestion and pain , dry cough  Very nice patient.  Having throbbing headache some nausea low-grade fever cough runny nose COVID-positive does have some risk factors for complicated disease including age and diabetes.  Review of Systems     Objective:   Physical Exam  Patient had virtual visit-video Appears to be in no distress Atraumatic Neuro able to relate and oriented No apparent resp distress Color normal   Previous GFR from June adequate    Assessment & Plan:   Patient does not appear to be air hungry does not appear to be toxic COVID Paxlovid reviewed Medications adjustments recommended Reduce sertraline down to half dose, hold metformin hold  rosuvastatin while on medicine Warning signs regarding COVID discussed self-isolation discussed

## 2023-01-11 NOTE — Telephone Encounter (Signed)
1.  Need to do a phone visit with her this afternoon If she is having difficulty breathing like shortness of breath and her lungs I would need to see her in person Also be proactive find out if [facility] on [address] has Paxlovid?

## 2023-01-11 NOTE — Telephone Encounter (Signed)
Patient has been notified per drs notes and recommendations.

## 2023-03-24 ENCOUNTER — Ambulatory Visit (INDEPENDENT_AMBULATORY_CARE_PROVIDER_SITE_OTHER): Payer: Medicare Other

## 2023-03-24 VITALS — Ht 62.5 in | Wt 200.0 lb

## 2023-03-24 DIAGNOSIS — Z0000 Encounter for general adult medical examination without abnormal findings: Secondary | ICD-10-CM

## 2023-03-24 DIAGNOSIS — Z1231 Encounter for screening mammogram for malignant neoplasm of breast: Secondary | ICD-10-CM

## 2023-03-24 DIAGNOSIS — Z1211 Encounter for screening for malignant neoplasm of colon: Secondary | ICD-10-CM

## 2023-03-24 DIAGNOSIS — Z780 Asymptomatic menopausal state: Secondary | ICD-10-CM

## 2023-03-24 NOTE — Patient Instructions (Signed)
Aimee Dixon , Thank you for taking time to come for your Medicare Wellness Visit. I appreciate your ongoing commitment to your health goals. Please review the following plan we discussed and let me know if I can assist you in the future.   Referrals/Orders/Follow-Ups/Clinician Recommendations:  Next Medicare Annual Wellness Visit: March 29, 2024 at 12:30 pm virtual visit  You have an order for:  []   2D Mammogram  [x]   3D Mammogram  [x]   Bone Density   []   Lung Cancer Screening  Please call for appointment:   [facility] at [facility] [address]iology [place], [place] [address] [phone]  Make sure to wear two-piece clothing.  No lotions powders or deodorants the day of the appointment Make sure to bring picture ID and insurance card.  Bring list of medications you are currently taking including any supplements.   Schedule your [place] screening mammogram through MyChart!   Log into your MyChart account.  Go to 'Visit' (or 'Appointments' if on mobile App) --> Schedule an Appointment  Under 'Select a Reason for Visit' choose the Mammogram Screening option.  Complete the pre-visit questions and select the time and place that best fits your schedule.  You have been referred to [facility].If you haven't heard from them within the next week, please call them to schedule your appointment.    Address: [address], [place], [place] [address] Phone: [phone]   This is a list of the screening recommended for you and due dates:  Health Maintenance  Topic Date Due   Zoster (Shingles) Vaccine (2 of 2) 04/07/2020   DTaP/Tdap/Td vaccine (2 - Tdap) 06/30/2021   DEXA scan (bone density measurement)  09/28/2021   Yearly kidney health urinalysis for diabetes  09/21/2022   Mammogram  12/16/2022   COVID-19 Vaccine (4 - 2023-24 season) 01/29/2023   Colon Cancer Screening  03/27/2023   Flu Shot  08/28/2023*   Yearly kidney function blood test  for diabetes  11/10/2023   Medicare Annual Wellness Visit  03/23/2024   Pneumonia Vaccine  Completed   Hepatitis C Screening  Completed   HPV Vaccine  Aged Out  *Topic was postponed. The date shown is not the original due date.    Advanced directives: (ACP Link)Information on Advanced Care Planning can be found at [facility] of [facility] Advance Health Care Directives Advance Health Care Directives ([email or web address]   Next Medicare Annual Wellness Visit scheduled for next year: Yes  Preventive Care 65 Years and Older, Female Preventive care refers to lifestyle choices and visits with your health care provider that can promote health and wellness. Preventive care visits are also called wellness exams. What can I expect for my preventive care visit? Counseling Your health care provider may ask you questions about your: Medical history, including: Past medical problems. Family medical history. Pregnancy and menstrual history. History of falls. Current health, including: Memory and ability to understand (cognition). Emotional well-being. Home life and relationship well-being. Sexual activity and sexual health. Lifestyle, including: Alcohol, nicotine or tobacco, and drug use. Access to firearms. Diet, exercise, and sleep habits. Work and work Astronomer. Sunscreen use. Safety issues such as seatbelt and bike helmet use. Physical exam Your health care provider will check your: Height and weight. These may be used to calculate your BMI (body mass index). BMI is a measurement that tells if you are at a healthy weight. Waist circumference. This measures the distance around your waistline. This measurement also tells if  you are at a healthy weight and may help predict your risk of certain diseases, such as type 2 diabetes and high blood pressure. Heart rate and blood pressure. Body temperature. Skin for abnormal spots. What immunizations do I need?  Vaccines are usually given at  various ages, according to a schedule. Your health care provider will recommend vaccines for you based on your age, medical history, and lifestyle or other factors, such as travel or where you work. What tests do I need? Screening Your health care provider may recommend screening tests for certain conditions. This may include: Lipid and cholesterol levels. Hepatitis C test. Hepatitis B test. HIV (human immunodeficiency virus) test. STI (sexually transmitted infection) testing, if you are at risk. Lung cancer screening. Colorectal cancer screening. Diabetes screening. This is done by checking your blood sugar (glucose) after you have not eaten for a while (fasting). Mammogram. Talk with your health care provider about how often you should have regular mammograms. BRCA-related cancer screening. This may be done if you have a family history of breast, ovarian, tubal, or peritoneal cancers. Bone density scan. This is done to screen for osteoporosis. Talk with your health care provider about your test results, treatment options, and if necessary, the need for more tests. Follow these instructions at home: Eating and drinking  Eat a diet that includes fresh fruits and vegetables, whole grains, lean protein, and low-fat dairy products. Limit your intake of foods with high amounts of sugar, saturated fats, and salt. Take vitamin and mineral supplements as recommended by your health care provider. Do not drink alcohol if your health care provider tells you not to drink. If you drink alcohol: Limit how much you have to 0-1 drink a day. Know how much alcohol is in your drink. In the [place] one drink equals one 12 oz bottle of beer (355 mL), one 5 oz glass of wine (148 mL), or one 1 oz glass of hard liquor (44 mL). Lifestyle Brush your teeth every morning and night with fluoride toothpaste. Floss one time each day. Exercise for at least 30 minutes 5 or more days each week. Do not use any products  that contain nicotine or tobacco. These products include cigarettes, chewing tobacco, and vaping devices, such as e-cigarettes. If you need help quitting, ask your health care provider. Do not use drugs. If you are sexually active, practice safe sex. Use a condom or other form of protection in order to prevent STIs. Take aspirin only as told by your health care provider. Make sure that you understand how much to take and what form to take. Work with your health care provider to find out whether it is safe and beneficial for you to take aspirin daily. Ask your health care provider if you need to take a cholesterol-lowering medicine (statin). Find healthy ways to manage stress, such as: Meditation, yoga, or listening to music. Journaling. Talking to a trusted person. Spending time with friends and family. Minimize exposure to UV radiation to reduce your risk of skin cancer. Safety Always wear your seat belt while driving or riding in a vehicle. Do not drive: If you have been drinking alcohol. Do not ride with someone who has been drinking. When you are tired or distracted. While texting. If you have been using any mind-altering substances or drugs. Wear a helmet and other protective equipment during sports activities. If you have firearms in your house, make sure you follow all gun safety procedures. What's next? Visit your health care provider  once a year for an annual wellness visit. Ask your health care provider how often you should have your eyes and teeth checked. Stay up to date on all vaccines. This information is not intended to replace advice given to you by your health care provider. Make sure you discuss any questions you have with your health care provider. Document Revised: 11/11/2020 Document Reviewed: 11/11/2020 Elsevier Patient Education  2024 [employer]. Understanding Your Risk for Falls Millions of people have serious injuries from falls each year. It is important to  understand your risk of falling. Talk with your health care provider about your risk and what you can do to lower it. If you do have a serious fall, make sure to tell your provider. Falling once raises your risk of falling again. How can falls affect me? Serious injuries from falls are common. These include: Broken bones, such as hip fractures. Head injuries, such as traumatic brain injuries (TBI) or concussions. A fear of falling can cause you to avoid activities and stay at home. This can make your muscles weaker and raise your risk for a fall. What can increase my risk? There are a number of risk factors that increase your risk for falling. The more risk factors you have, the higher your risk of falling. Serious injuries from a fall happen most often to people who are older than 70 years old. Teenagers and young adults ages 60-29 are also at higher risk. Common risk factors include: Weakness in the lower body. Being generally weak or confused due to long-term (chronic) illness. Dizziness or balance problems. Poor vision. Medicines that cause dizziness or drowsiness. These may include: Medicines for your blood pressure, heart, anxiety, insomnia, or swelling (edema). Pain medicines. Muscle relaxants. Other risk factors include: Drinking alcohol. Having had a fall in the past. Having foot pain or wearing improper footwear. Working at a dangerous job. Having any of the following in your home: Tripping hazards, such as floor clutter or loose rugs. Poor lighting. Pets. Having dementia or memory loss. What actions can I take to lower my risk of falling?     Physical activity Stay physically fit. Do strength and balance exercises. Consider taking a regular class to build strength and balance. Yoga and tai chi are good options. Vision Have your eyes checked every year and your prescription for glasses or contacts updated as needed. Shoes and walking aids Wear non-skid shoes. Wear  shoes that have rubber soles and low heels. Do not wear high heels. Do not walk around the house in socks or slippers. Use a cane or walker as told by your provider. Home safety Attach secure railings on both sides of your stairs. Install grab bars for your bathtub, shower, and toilet. Use a non-skid mat in your bathtub or shower. Attach bath mats securely with double-sided, non-slip rug tape. Use good lighting in all rooms. Keep a flashlight near your bed. Make sure there is a clear path from your bed to the bathroom. Use night-lights. Do not use throw rugs. Make sure all carpeting is taped or tacked down securely. Remove all clutter from walkways and stairways, including extension cords. Repair uneven or broken steps and floors. Avoid walking on icy or slippery surfaces. Walk on the grass instead of on icy or slick sidewalks. Use ice melter to get rid of ice on walkways in the winter. Use a cordless phone. Questions to ask your health care provider Can you help me check my risk for a fall? Do any  of my medicines make me more likely to fall? Should I take a vitamin D supplement? What exercises can I do to improve my strength and balance? Should I make an appointment to have my vision checked? Do I need a bone density test to check for weak bones (osteoporosis)? Would it help to use a cane or a walker? Where to find more information Centers for Disease Control and Prevention, [employer]: TonerPromos.no Community-Based Fall Prevention Programs: TonerPromos.no [employer] on Aging: BaseRingTones.pl Contact a health care provider if: You fall at home. You are afraid of falling at home. You feel weak, drowsy, or dizzy. This information is not intended to replace advice given to you by your health care provider. Make sure you discuss any questions you have with your health care provider. Document Revised: 01/17/2022 Document Reviewed: 01/17/2022 Elsevier Patient Education  2024 [employer].

## 2023-03-24 NOTE — Progress Notes (Signed)
Please attest this visit in the absence of patient primary care provider.   Because this visit was a virtual/telehealth visit,  certain criteria was not obtained, such a blood pressure, CBG if applicable, and timed get up and go. Any medications not marked as "taking" were not mentioned during the medication reconciliation part of the visit. Any vitals not documented were not able to be obtained due to this being a telehealth visit or patient was unable to self-report a recent blood pressure reading due to a lack of equipment at home via telehealth. Vitals that have been documented are verbally provided by the patient.   Subjective:   Aimee Dixon is a 72 y.o. female who presents for Medicare Annual (Subsequent) preventive examination.  Visit Complete: Virtual I connected with  Neal Dy on 03/24/23 by a audio enabled telemedicine application and verified that I am speaking with the correct person using two identifiers.  Patient Location: Home  Provider Location: Home Office  I discussed the limitations of evaluation and management by telemedicine. The patient expressed understanding and agreed to proceed.  Vital Signs: Because this visit was a virtual/telehealth visit, some criteria may be missing or patient reported. Any vitals not documented were not able to be obtained and vitals that have been documented are patient reported.  Patient Medicare AWV questionnaire was completed by the patient on na; I have confirmed that all information answered by patient is correct and no changes since this date.  Cardiac Risk Factors include: advanced age (>55men, >30 women);dyslipidemia;hypertension;obesity (BMI >30kg/m2);sedentary lifestyle     Objective:    Today's Vitals   03/24/23 1428  Weight: 200 lb (90.7 kg)  Height: 5' 2.5" (1.588 m)   Body mass index is 36 kg/m.     03/24/2023    2:28 PM 03/14/2022   11:32 AM 05/05/2021    8:10 AM 03/09/2021    3:07 PM 03/26/2020   10:11  AM 01/17/2018    7:37 AM  Advanced Directives  Does Patient Have a Medical Advance Directive? No No No No No No  Would patient like information on creating a medical advance directive? No - Patient declined No - Patient declined  No - Patient declined No - Patient declined No - Patient declined    Current Medications (verified) Outpatient Encounter Medications as of 03/24/2023  Medication Sig   acetaminophen (TYLENOL) 500 MG tablet Take 1,000 mg by mouth as needed for mild pain or moderate pain.   albuterol (VENTOLIN HFA) 108 (90 Base) MCG/ACT inhaler Inhale 2 puffs into the lungs every 4 (four) hours as needed for wheezing.   azelastine (ASTELIN) 0.1 % nasal spray Place 2 sprays into both nostrils 2 (two) times daily.   cetirizine (ZYRTEC) 10 MG tablet Take 10 mg by mouth daily.   cloNIDine (CATAPRES) 0.3 MG tablet TAKE 1 TABLET(0.3 MG) BY MOUTH TWICE DAILY   dicyclomine (BENTYL) 10 MG capsule TAKE 1 CAPSULE(10 MG) BY MOUTH FOUR TIMES DAILY BEFORE MEALS AND AT BEDTIME (Patient taking differently: Take 10 mg by mouth in the morning, at noon, in the evening, and at bedtime.)   fexofenadine (ALLEGRA) 180 MG tablet Take 180 mg by mouth daily.   guaiFENesin (MUCINEX) 600 MG 12 hr tablet Take 600 mg by mouth daily.    metFORMIN (GLUCOPHAGE) 500 MG tablet 1 qd   Multiple Vitamins-Minerals (MULTIVITAMIN WITH MINERALS) tablet Take 1 tablet by mouth daily.   NIFEdipine (PROCARDIA-XL/NIFEDICAL-XL) 30 MG 24 hr tablet TAKE 1 TABLET(30 MG) BY MOUTH  DAILY   pantoprazole (PROTONIX) 40 MG tablet TAKE 1 TABLET(40 MG) BY MOUTH DAILY   potassium chloride (KLOR-CON M) 10 MEQ tablet TAKE 1 TABLET BY MOUTH TWICE DAILY   rosuvastatin (CRESTOR) 40 MG tablet TAKE 1 TABLET(40 MG) BY MOUTH DAILY   sertraline (ZOLOFT) 100 MG tablet TAKE 1 AND 1/2 TABLETS(150 MG) BY MOUTH DAILY   triamcinolone cream (KENALOG) 0.1 % Apply 1 Application topically 2 (two) times daily. Apply thin amount twice daily as needed.    valsartan-hydrochlorothiazide (DIOVAN-HCT) 160-25 MG tablet TAKE 1 TABLET BY MOUTH DAILY   No facility-administered encounter medications on file as of 03/24/2023.    Allergies (verified) Apresoline [hydralazine] and Norvasc [amlodipine besylate]   History: Past Medical History:  Diagnosis Date   Hyperlipidemia    Hypertension    IDA (iron deficiency anemia)    Tubular adenoma of colon    Past Surgical History:  Procedure Laterality Date   ABDOMINAL HYSTERECTOMY     BIOPSY  03/26/2020   Procedure: BIOPSY;  Surgeon: Corbin Ade, MD;  Location: [facility];  Service: Endoscopy;;   COLONOSCOPY  06/30/2003   NUU:VOZD papilla/Otherwise,normal rectum/Pedunculated polyp at middescending colon/Smaller pedunculated polyp at hepatic flexure, cold snared   COLONOSCOPY   08/07/2006   GUY:QIHKVQQ internal hemorrhoids, otherwise normal rectum/Diminutive ascending colon adenomatous polyps   COLONOSCOPY  08/03/2009   RMR: tubular adenomas removed from hepatic flexure & desc colon, internal hemorrhoids and anal papilla otherwise norma;   COLONOSCOPY N/A 10/03/2012   Procedure: COLONOSCOPY;  Surgeon: Corbin Ade, MD;  Location: [facility];  Service: Endoscopy;  Laterality: N/A;  2:15   COLONOSCOPY N/A 01/17/2018   Three 4-7 mm polyps in descending colon and at hepatic flexure, s/p removal. Tubular adenomas. Surveillance in 2022.    COLONOSCOPY WITH PROPOFOL N/A 03/26/2020    one 10 mm polyp (tubular adenoma), otherwise normal, s/p segmental biopsies (benign)   POLYPECTOMY  01/17/2018   Procedure: POLYPECTOMY;  Surgeon: Corbin Ade, MD;  [facility]: [facility];  Service: Endoscopy;;  hepatic flexure x2;descending colon   POLYPECTOMY  03/26/2020   Procedure: POLYPECTOMY;  Surgeon: Corbin Ade, MD;  Location: [facility];  Service: Endoscopy;;   Family History  Problem Relation Age of Onset   Lung cancer Father    Breast cancer Mother 57   Colon cancer Neg Hx    Social  History   Socioeconomic History   Marital status: Married    Spouse name: Wiliam   Number of children: 1   Years of education: Not on file   Highest education level: Not on file  Occupational History   Occupation: Youth worker Room    Employer: [employer]  Tobacco Use   Smoking status: Never   Smokeless tobacco: Never  Vaping Use   Vaping status: Never Used  Substance and Sexual Activity   Alcohol use: No   Drug use: No   Sexual activity: Yes    Birth control/protection: Surgical  Other Topics Concern   Not on file  Social History Narrative   Lives w/ husband. 1 daughter, 2 stepchildren. 5 grandchildren   Social Determinants of Health   Financial Resource Strain: Low Risk  (03/24/2023)   Overall Financial Resource Strain (CARDIA)    Difficulty of Paying Living Expenses: Not hard at all  Food Insecurity: No Food Insecurity (03/24/2023)   Hunger Vital Sign    Worried About Running Out of Food in the Last Year: Never true  Ran Out of Food in the Last Year: Never true  Transportation Needs: No Transportation Needs (03/24/2023)   PRAPARE - Administrator, Civil Service (Medical): No    Lack of Transportation (Non-Medical): No  Physical Activity: Sufficiently Active (03/24/2023)   Exercise Vital Sign    Days of Exercise per Week: 7 days    Minutes of Exercise per Session: 30 min  Stress: No Stress Concern Present (03/24/2023)   [employer] of Occupational Health - Occupational Stress Questionnaire    Feeling of Stress : Not at all  Social Connections: Moderately Isolated (03/24/2023)   Social Connection and Isolation Panel [NHANES]    Frequency of Communication with Friends and Family: More than three times a week    Frequency of Social Gatherings with Friends and Family: More than three times a week    Attends Religious Services: Never    Database administrator or Organizations: No    Attends Engineer, structural: More than 4 times per  year    Marital Status: Widowed    Tobacco Counseling Counseling given: Yes   Clinical Intake:  Pre-visit preparation completed: Yes  Pain : No/denies pain     BMI - recorded: 36 Nutritional Status: BMI > 30  Obese Nutritional Risks: None Diabetes: No (per patient she is borderline diabetic)  How often do you need to have someone help you when you read instructions, pamphlets, or other written materials from your doctor or pharmacy?: 1 - Never  Interpreter Needed?: No  Information entered by :: Abby Noe Pittsley, CMA   Activities of Daily Living    03/24/2023    2:40 PM  In your present state of health, do you have any difficulty performing the following activities:  Hearing? 0  Vision? 0  Difficulty concentrating or making decisions? 0  Walking or climbing stairs? 0  Dressing or bathing? 0  Doing errands, shopping? 0  Preparing Food and eating ? N  Using the Toilet? N  In the past six months, have you accidently leaked urine? N  Do you have problems with loss of bowel control? N  Managing your Medications? N  Managing your Finances? N  Housekeeping or managing your Housekeeping? N    Patient Care Team: Babs Sciara, MD as PCP - General (Family Medicine) Jena Gauss Gerrit Friends, MD as Consulting Physician (Gastroenterology)  Indicate any recent Medical Services you may have received from other than [facility] providers in the past year (date may be approximate).     Assessment:   This is a routine wellness examination for Sallee.  Hearing/Vision screen Hearing Screening - Comments:: Patient denies any hearing difficulties.   Vision Screening - Comments:: Wears rx glasses - up to date with routine eye exams with [facility] in [place]    Goals Addressed             This Visit's Progress    Patient Stated       Maintain my health and get my blood pressure under better control.        Depression Screen    03/24/2023    2:33 PM 10/31/2022   10:30 AM  10/31/2022   10:24 AM 03/14/2022   11:31 AM 03/09/2021    3:01 PM 09/03/2020    2:19 PM 08/13/2020    2:04 PM  PHQ 2/9 Scores  PHQ - 2 Score 1 1 3  0 2 0 2  PHQ- 9 Score 2  6 0 3  4  Fall Risk    03/24/2023    2:40 PM 10/31/2022   10:29 AM 10/31/2022   10:23 AM 03/14/2022   11:33 AM 05/10/2021   11:42 AM  Fall Risk   Falls in the past year? 0 1 1 0 1  Number falls in past yr: 0 1 1 0 1  Comment     Patient states she had "slips"  Injury with Fall? 0 0 0 0 0  Risk for fall due to : No Fall Risks   No Fall Risks Impaired balance/gait  Follow up Falls prevention discussed   Falls prevention discussed;Falls evaluation completed Falls evaluation completed;Education provided    MEDICARE RISK AT HOME: Medicare Risk at Home Any stairs in or around the home?: No If so, are there any without handrails?: No Home free of loose throw rugs in walkways, pet beds, electrical cords, etc?: Yes Adequate lighting in your home to reduce risk of falls?: Yes Life alert?: No Use of a cane, walker or w/c?: No Grab bars in the bathroom?: Yes Shower chair or bench in shower?: No Elevated toilet seat or a handicapped toilet?: No  TIMED UP AND GO:  Was the test performed?  No    Cognitive Function:        03/24/2023    2:32 PM 03/14/2022   11:33 AM 03/09/2021    3:10 PM  6CIT Screen  What Year? 0 points 0 points 0 points  What month? 0 points 0 points 0 points  What time? 0 points 0 points 0 points  Count back from 20 0 points 0 points 0 points  Months in reverse 0 points 0 points 0 points  Repeat phrase 0 points 0 points 0 points  Total Score 0 points 0 points 0 points    Immunizations Immunization History  Administered Date(s) Administered   Fluad Quad(high Dose 65+) 04/20/2020, 04/23/2021   Influenza, High Dose Seasonal PF 04/04/2019   Influenza-Unspecified 03/31/2011, 12/28/2012, 04/02/2019   Moderna Sars-Covid-2 Vaccination 07/07/2019, 08/07/2019, 05/04/2020   PNEUMOCOCCAL  CONJUGATE-20 09/23/2021   Pneumococcal Polysaccharide-23 12/18/2019   Td 07/01/2011   Zoster Recombinant(Shingrix) 02/11/2020   Zoster, Live 04/09/2013    TDAP status: Due, Education has been provided regarding the importance of this vaccine. Advised may receive this vaccine at local pharmacy or Health Dept. Aware to provide a copy of the vaccination record if obtained from local pharmacy or Health Dept. Verbalized acceptance and understanding.  Flu Vaccine status: Due, Education has been provided regarding the importance of this vaccine. Advised may receive this vaccine at local pharmacy or Health Dept. Aware to provide a copy of the vaccination record if obtained from local pharmacy or Health Dept. Verbalized acceptance and understanding.  Pneumococcal vaccine status: Up to date  Covid-19 vaccine status: Information provided on how to obtain vaccines.   Qualifies for Shingles Vaccine? Yes   Zostavax completed Yes   Shingrix Completed?: No.    Education has been provided regarding the importance of this vaccine. Patient has been advised to call insurance company to determine out of pocket expense if they have not yet received this vaccine. Advised may also receive vaccine at local pharmacy or Health Dept. Verbalized acceptance and understanding.  Screening Tests Health Maintenance  Topic Date Due   Zoster Vaccines- Shingrix (2 of 2) 04/07/2020   DTaP/Tdap/Td (2 - Tdap) 06/30/2021   Diabetic kidney evaluation - Urine ACR  09/21/2022   MAMMOGRAM  12/16/2022   COVID-19 Vaccine (4 - 2023-24 season) 01/29/2023  Medicare Annual Wellness (AWV)  03/15/2023   Colonoscopy  03/27/2023   INFLUENZA VACCINE  08/28/2023 (Originally 12/29/2022)   Diabetic kidney evaluation - eGFR measurement  11/10/2023   Pneumonia Vaccine 58+ Years old  Completed   DEXA SCAN  Completed   Hepatitis C Screening  Completed   HPV VACCINES  Aged Out    Health Maintenance  Health Maintenance Due  Topic Date Due    Zoster Vaccines- Shingrix (2 of 2) 04/07/2020   DTaP/Tdap/Td (2 - Tdap) 06/30/2021   Diabetic kidney evaluation - Urine ACR  09/21/2022   MAMMOGRAM  12/16/2022   COVID-19 Vaccine (4 - 2023-24 season) 01/29/2023   Medicare Annual Wellness (AWV)  03/15/2023   Colonoscopy  03/27/2023    Colorectal cancer screening: Referral to GI placed 03/24/2023. Pt aware the office will call re: appt.  Mammogram status: Ordered 03/24/2023. Pt provided with contact info and advised to call to schedule appt.   Bone Density status: Ordered 03/24/2023. Pt provided with contact info and advised to call to schedule appt.  Lung Cancer Screening: (Low Dose CT Chest recommended if Age 41-80 years, 20 pack-year currently smoking OR have quit w/in 15years.) does not qualify.   Lung Cancer Screening Referral: na  Additional Screening:  Hepatitis C Screening: does not qualify; Completed 06/23/2015  Vision Screening: Recommended annual ophthalmology exams for early detection of glaucoma and other disorders of the eye. Is the patient up to date with their annual eye exam?  Yes  Who is the provider or what is the name of the office in which the patient attends annual eye exams? [facility] [place] If pt is not established with a provider, would they like to be referred to a provider to establish care? No .   Dental Screening: Recommended annual dental exams for proper oral hygiene  Diabetic Foot Exam: na  Community Resource Referral / Chronic Care Management: CRR required this visit?  No   CCM required this visit?  No     Plan:     I have personally reviewed and noted the following in the patient's chart:   Medical and social history Use of alcohol, tobacco or illicit drugs  Current medications and supplements including opioid prescriptions. Patient is not currently taking opioid prescriptions. Functional ability and status Nutritional status Physical activity Advanced directives List of  other physicians Hospitalizations, surgeries, and ER visits in previous 12 months Vitals Screenings to include cognitive, depression, and falls Referrals and appointments  In addition, I have reviewed and discussed with patient certain preventive protocols, quality metrics, and best practice recommendations. A written personalized care plan for preventive services as well as general preventive health recommendations were provided to patient.     Jordan Hawks Neziah Vogelgesang, CMA   03/24/2023   After Visit Summary: (MyChart) Due to this being a telephonic visit, the after visit summary with patients personalized plan was offered to patient via MyChart

## 2023-03-27 ENCOUNTER — Encounter: Payer: Self-pay | Admitting: *Deleted

## 2023-03-27 ENCOUNTER — Encounter (INDEPENDENT_AMBULATORY_CARE_PROVIDER_SITE_OTHER): Payer: Self-pay | Admitting: *Deleted

## 2023-03-30 ENCOUNTER — Ambulatory Visit (HOSPITAL_COMMUNITY)
Admission: RE | Admit: 2023-03-30 | Discharge: 2023-03-30 | Disposition: A | Discharge status: Home / Self Care | Payer: Medicare Other | Source: Ambulatory Visit | Attending: Nurse Practitioner | Admitting: Nurse Practitioner

## 2023-03-30 ENCOUNTER — Encounter (HOSPITAL_COMMUNITY): Payer: Self-pay

## 2023-03-30 DIAGNOSIS — Z1231 Encounter for screening mammogram for malignant neoplasm of breast: Secondary | ICD-10-CM | POA: Insufficient documentation

## 2023-03-30 DIAGNOSIS — Z0000 Encounter for general adult medical examination without abnormal findings: Secondary | ICD-10-CM

## 2023-05-05 ENCOUNTER — Telehealth: Payer: Self-pay | Admitting: Family Medicine

## 2023-05-05 ENCOUNTER — Other Ambulatory Visit: Payer: Self-pay

## 2023-05-05 MED ORDER — POTASSIUM CHLORIDE CRYS ER 10 MEQ PO TBCR: EXTENDED_RELEASE_TABLET | ORAL | 1 refills | Status: DC

## 2023-05-05 NOTE — Telephone Encounter (Signed)
Refill for potassium sent in to [facility]

## 2023-05-05 NOTE — Telephone Encounter (Signed)
Refill on    potassium chloride (KLOR-CON M) 10 MEQ tablet  Send to [facility]

## 2023-05-15 ENCOUNTER — Telehealth: Payer: Self-pay

## 2023-05-15 ENCOUNTER — Other Ambulatory Visit: Payer: Self-pay | Admitting: Family Medicine

## 2023-05-15 NOTE — Telephone Encounter (Signed)
Prescription Request  05/15/2023  LOV: Visit date not found  What is the name of the medication or equipment? metFORMIN (GLUCOPHAGE) 500 MG tablet   Have you contacted your pharmacy to request a refill? Yes   Which pharmacy would you like this sent to?  [id number] - [place], [place] - [address] [place] [address] Phone: [phone] Fax: [phone]    Patient notified that their request is being sent to the clinical staff for review and that they should receive a response within 2 business days.   Please advise at Mobile [phone] (mobile)

## 2023-05-15 NOTE — Telephone Encounter (Signed)
Patient may have metformin refills as requested-may have 90-day Patient also needs to do lab work as well as a follow-up office visit Recommend metabolic 7, lipid, liver, A1c, urine ACR Hyperlipidemia, diabetes, hypertension Please do the lab work by the first part of January Please schedule an office visit for January for the patient

## 2023-05-16 ENCOUNTER — Other Ambulatory Visit: Payer: Self-pay

## 2023-05-16 DIAGNOSIS — Z79899 Other long term (current) drug therapy: Secondary | ICD-10-CM

## 2023-05-16 DIAGNOSIS — E782 Mixed hyperlipidemia: Secondary | ICD-10-CM

## 2023-05-16 DIAGNOSIS — I10 Essential (primary) hypertension: Secondary | ICD-10-CM

## 2023-05-16 DIAGNOSIS — E119 Type 2 diabetes mellitus without complications: Secondary | ICD-10-CM

## 2023-05-16 MED ORDER — METFORMIN HCL 500 MG PO TABS: ORAL_TABLET | ORAL | 1 refills | Status: DC

## 2023-05-16 NOTE — Telephone Encounter (Signed)
Rx sent in to Pharmacy and my chart message sent in to inform pt of recommendations Labs have been ordered

## 2023-05-19 ENCOUNTER — Ambulatory Visit: Payer: Medicare Other | Admitting: Orthopedic Surgery

## 2023-06-01 ENCOUNTER — Encounter: Payer: Self-pay | Admitting: Orthopedic Surgery

## 2023-06-01 ENCOUNTER — Ambulatory Visit: Payer: Medicare Other | Admitting: Orthopedic Surgery

## 2023-06-01 ENCOUNTER — Other Ambulatory Visit (INDEPENDENT_AMBULATORY_CARE_PROVIDER_SITE_OTHER): Payer: Medicare Other

## 2023-06-01 VITALS — BP 136/79 | HR 96

## 2023-06-01 DIAGNOSIS — M7521 Bicipital tendinitis, right shoulder: Secondary | ICD-10-CM | POA: Diagnosis not present

## 2023-06-01 DIAGNOSIS — G8929 Other chronic pain: Secondary | ICD-10-CM | POA: Diagnosis not present

## 2023-06-01 DIAGNOSIS — M25511 Pain in right shoulder: Secondary | ICD-10-CM

## 2023-06-01 MED ORDER — IBUPROFEN 800 MG PO TABS: 800.0000 mg | ORAL_TABLET | Freq: Three times a day (TID) | ORAL | 1 refills | Status: AC | PRN

## 2023-06-01 MED ORDER — METHYLPREDNISOLONE ACETATE 40 MG/ML IJ SUSP
40.0000 mg | Freq: Once | INTRAMUSCULAR | Status: AC
  Administered 2023-06-01: 40 mg via INTRA_ARTICULAR

## 2023-06-01 NOTE — Progress Notes (Signed)
 NEW PROBLEM//OFFICE VISIT   Patient: Aimee Dixon           Date of Birth: 26-Mar-1951           MRN: [id number] Visit Date: 06/01/2023 Requested by: Alphonsa Glendia LABOR, MD [address] [place],  [place] [address] PCP: Alphonsa Glendia LABOR, MD  [facility] MEDICARE   Chief Complaint  Patient presents with   Shoulder Pain    R shoulder getting worse over the past mo w/ any type of reaching motions. Has tried OTC rubs and ibuprofen  that provided some relief in the beginning but not helping now.     This is a 73 year old female who comes in with right shoulder pain.  She gives a history of having felt a pop in the right shoulder about 5 years ago but recovered without any treatment  She has been having pain in the right shoulder and arm for the last month or so she tried some ibuprofen  did not get good relief  She localizes her pain over the front of the shoulder and right down the arm over the biceps tendon  She noticed pain when reaching overhead or reaching away from her body  She has bilateral shoulder pain at times thought it was due to arthritis     Review of Systems  Constitutional:  Negative for chills, fever and weight loss.  Respiratory:  Negative for shortness of breath.   Cardiovascular:  Negative for chest pain.  Musculoskeletal:  Negative for neck pain.  Neurological:  Negative for tingling.    Past Medical History:  Diagnosis Date   Hyperlipidemia    Hypertension    IDA (iron deficiency anemia)    Tubular adenoma of colon      Allergies  Allergen Reactions   Apresoline  [Hydralazine ] Hives and Itching   Norvasc [Amlodipine Besylate] Other (See Comments)    Gums bleed    Current Outpatient Medications  Medication Instructions   acetaminophen  (TYLENOL ) 1,000 mg, Oral, As needed   albuterol  (VENTOLIN  HFA) 108 (90 Base) MCG/ACT inhaler 2 puffs, Inhalation, Every 4 hours PRN   azelastine  (ASTELIN ) 0.1 % nasal spray 2 sprays, Each Nare, 2 times daily    cetirizine (ZYRTEC) 10 mg, Oral, Daily   cloNIDine  (CATAPRES ) 0.3 MG tablet TAKE 1 TABLET(0.3 MG) BY MOUTH TWICE DAILY   dicyclomine  (BENTYL ) 10 MG capsule TAKE 1 CAPSULE(10 MG) BY MOUTH FOUR TIMES DAILY BEFORE MEALS AND AT BEDTIME   fexofenadine (ALLEGRA) 180 mg, Daily   guaiFENesin (MUCINEX) 600 mg, Oral, Daily   ibuprofen  (ADVIL ) 800 mg, Oral, Every 8 hours PRN   metFORMIN  (GLUCOPHAGE ) 500 MG tablet 1 qd   Multiple Vitamins-Minerals (MULTIVITAMIN WITH MINERALS) tablet 1 tablet, Oral, Daily   NIFEdipine  (PROCARDIA -XL/NIFEDICAL-XL) 30 MG 24 hr tablet TAKE 1 TABLET(30 MG) BY MOUTH DAILY   pantoprazole  (PROTONIX ) 40 MG tablet TAKE 1 TABLET(40 MG) BY MOUTH DAILY   potassium chloride  (KLOR-CON  M) 10 MEQ tablet TAKE 1 TABLET BY MOUTH TWICE DAILY   rosuvastatin  (CRESTOR ) 40 MG tablet TAKE 1 TABLET(40 MG) BY MOUTH DAILY   sertraline  (ZOLOFT ) 100 MG tablet TAKE 1 AND 1/2 TABLETS(150 MG) BY MOUTH DAILY   triamcinolone  cream (KENALOG ) 0.1 % 1 Application, Topical, 2 times daily, Apply thin amount twice daily as needed.   valsartan -hydrochlorothiazide  (DIOVAN -HCT) 160-25 MG tablet 1 tablet, Oral, Daily      BP 136/79   Pulse 96   There is no height or weight on file to calculate BMI.  General appearance: Well-developed well-nourished no gross deformities  Cardiovascular normal pulse and perfusion normal color without edema  Neurologically no sensation loss or deficits or pathologic reflexes  Psychological: Awake alert and oriented x3 mood and affect normal  Skin no lacerations or ulcerations no nodularity no palpable masses, no erythema or nodularity  Musculoskeletal:  Ortho Exam  Right shoulder abduction right shoulder empty can position grade 5 out of 5 strength although there is pain with resistance  There is pain with passive extension of the shoulder and there is tenderness over the biceps tendon.  The passive supination flexion test is also positive for pain this is exacerbated  with resistance testing indicating positive Speed sign  Mild impingement sign is also noted.     Past Medical History:  Diagnosis Date   Hyperlipidemia    Hypertension    IDA (iron deficiency anemia)    Tubular adenoma of colon     Past Surgical History:  Procedure Laterality Date   ABDOMINAL HYSTERECTOMY     BIOPSY  03/26/2020   Procedure: BIOPSY;  Surgeon: Shaaron Lamar HERO, MD;  [facility]: [facility];  Service: Endoscopy;;   COLONOSCOPY  06/30/2003   MFM:Jwjo papilla/Otherwise,normal rectum/Pedunculated polyp at middescending colon/Smaller pedunculated polyp at hepatic flexure, cold snared   COLONOSCOPY   08/07/2006   MFM:Fpwpfjo internal hemorrhoids, otherwise normal rectum/Diminutive ascending colon adenomatous polyps   COLONOSCOPY  08/03/2009   RMR: tubular adenomas removed from hepatic flexure & desc colon, internal hemorrhoids and anal papilla otherwise norma;   COLONOSCOPY N/A 10/03/2012   Procedure: COLONOSCOPY;  Surgeon: Lamar HERO Shaaron, MD;  [facility]: [facility];  Service: Endoscopy;  Laterality: N/A;  2:15   COLONOSCOPY N/A 01/17/2018   Three 4-7 mm polyps in descending colon and at hepatic flexure, s/p removal. Tubular adenomas. Surveillance in 2022.    COLONOSCOPY WITH PROPOFOL  N/A 03/26/2020    one 10 mm polyp (tubular adenoma), otherwise normal, s/p segmental biopsies (benign)   POLYPECTOMY  01/17/2018   Procedure: POLYPECTOMY;  Surgeon: Shaaron Lamar HERO, MD;  [facility];  Service: Endoscopy;;  hepatic flexure x2;descending colon   POLYPECTOMY  03/26/2020   Procedure: POLYPECTOMY;  Surgeon: Shaaron Lamar HERO, MD;  [facility];  Service: Endoscopy;;    Family History  Problem Relation Age of Onset   Lung cancer Father    Breast cancer Mother 61   Colon cancer Neg Hx    Social History   Tobacco Use   Smoking status: Never   Smokeless tobacco: Never  Vaping Use   Vaping status: Never Used  Substance Use Topics   Alcohol use: No    Drug use: No    Allergies  Allergen Reactions   Apresoline  [Hydralazine ] Hives and Itching   Norvasc [Amlodipine Besylate] Other (See Comments)    Gums bleed    Current Meds  Medication Sig   ibuprofen  (ADVIL ) 800 MG tablet Take 1 tablet (800 mg total) by mouth every 8 (eight) hours as needed.     MEDICAL DECISION MAKING   IMAGING:  DG Shoulder Right Result Date: 06/01/2023 Imaging report right shoulder acute pain Normal humeral head and glenoid may have some slight narrowing of the posterior glenoid acromion is a type II, the joint shows some mild degenerative changes, no other abnormality seen Impression mild OA in the glenohumeral joint, AC joint OA    DG Shoulder Right Result Date: 06/01/2023 Imaging report right shoulder acute pain Normal humeral head and glenoid may have  some slight narrowing of the posterior glenoid acromion is a type II, the joint shows some mild degenerative changes, no other abnormality seen Impression mild OA in the glenohumeral joint, AC joint OA    Orders: PT   Outside records reviewed: no   C. MANAGEMENT   Recommend physical therapy  Anti-inflammatory, ibuprofen   Biceps tendon injection  Return if no improvement after PT  Meds ordered this encounter  Medications   methylPREDNISolone  acetate (DEPO-MEDROL ) injection 40 mg   ibuprofen  (ADVIL ) 800 MG tablet    Sig: Take 1 tablet (800 mg total) by mouth every 8 (eight) hours as needed.    Dispense:  90 tablet    Refill:  1    PROCEDURES: Biceps tendon sheath groove injection  Procedure: Biceps tendon sheath  injection Right biceps tendon was injected The patient gave verbal consent for cortisone injection Timeout confirmed the site of injection Medications used included 40 mg of Depo-Medrol  and 3 mL 1% lidocaine  After alcohol and ethyl chloride preparation the point of maximal tenderness was injected over the right biceps tendon there were no complications    Taft Minerva,  MD  06/01/2023 11:24 AM

## 2023-06-06 ENCOUNTER — Encounter (HOSPITAL_COMMUNITY): Payer: Self-pay | Admitting: Occupational Therapy

## 2023-06-06 ENCOUNTER — Ambulatory Visit (HOSPITAL_COMMUNITY): Payer: Medicare Other | Attending: Orthopedic Surgery | Admitting: Occupational Therapy

## 2023-06-06 ENCOUNTER — Other Ambulatory Visit: Payer: Self-pay

## 2023-06-06 DIAGNOSIS — M25511 Pain in right shoulder: Secondary | ICD-10-CM | POA: Insufficient documentation

## 2023-06-06 DIAGNOSIS — R29898 Other symptoms and signs involving the musculoskeletal system: Secondary | ICD-10-CM | POA: Diagnosis not present

## 2023-06-06 NOTE — Therapy (Signed)
 OUTPATIENT OCCUPATIONAL THERAPY ORTHO EVALUATION  Patient Name: Aimee Dixon MRN: [id number] DOB:15-Jun-1950, 73 y.o., female Today's Date: 06/06/2023   END OF SESSION:  OT End of Session - 06/06/23 1450     Visit Number 1    Number of Visits 8    Date for OT Re-Evaluation 07/06/23    Authorization Type [employer] Medicare, $20 copay    Authorization Time Period requesting visits    Progress Note Due on Visit 10    OT Start Time 1359    OT Stop Time 1430    OT Time Calculation (min) 31 min    Activity Tolerance Patient tolerated treatment well    Behavior During Therapy WFL for tasks assessed/performed             Past Medical History:  Diagnosis Date   Hyperlipidemia    Hypertension    IDA (iron deficiency anemia)    Tubular adenoma of colon    Past Surgical History:  Procedure Laterality Date   ABDOMINAL HYSTERECTOMY     BIOPSY  03/26/2020   Procedure: BIOPSY;  Surgeon: Shaaron Lamar HERO, MD;  [facility];  Service: Endoscopy;;   COLONOSCOPY  06/30/2003   MFM:Jwjo papilla/Otherwise,normal rectum/Pedunculated polyp at middescending colon/Smaller pedunculated polyp at hepatic flexure, cold snared   COLONOSCOPY   08/07/2006   MFM:Fpwpfjo internal hemorrhoids, otherwise normal rectum/Diminutive ascending colon adenomatous polyps   COLONOSCOPY  08/03/2009   RMR: tubular adenomas removed from hepatic flexure & desc colon, internal hemorrhoids and anal papilla otherwise norma;   COLONOSCOPY N/A 10/03/2012   Procedure: COLONOSCOPY;  Surgeon: Lamar HERO Shaaron, MD;  [facility]: [facility];  Service: Endoscopy;  Laterality: N/A;  2:15   COLONOSCOPY N/A 01/17/2018   Three 4-7 mm polyps in descending colon and at hepatic flexure, s/p removal. Tubular adenomas. Surveillance in 2022.    COLONOSCOPY WITH PROPOFOL  N/A 03/26/2020    one 10 mm polyp (tubular adenoma), otherwise normal, s/p segmental biopsies (benign)   POLYPECTOMY  01/17/2018   Procedure: POLYPECTOMY;  Surgeon:  Shaaron Lamar HERO, MD;  [facility];  Service: Endoscopy;;  hepatic flexure x2;descending colon   POLYPECTOMY  03/26/2020   Procedure: POLYPECTOMY;  Surgeon: Shaaron Lamar HERO, MD;  [facility];  Service: Endoscopy;;   Patient Active Problem List   Diagnosis Date Noted   Chronic diarrhea 06/24/2020   Change in bowel habits 11/22/2019   GERD (gastroesophageal reflux disease) 11/22/2019   Vitamin D  deficiency 09/22/2016   Onychomycosis 09/09/2015   Morbid obesity (HCC) 08/09/2014   Pain in joint, hand 10/28/2013   Muscle weakness (generalized) 10/28/2013   Lack of coordination 10/28/2013   Prediabetes 08/02/2013   HTN (hypertension), benign 08/02/2013   Hyperlipidemia 08/02/2013   Arthralgia of hand 08/02/2013   Tubular adenoma of colon    KNEE, ARTHRITIS, DEGEN./OSTEO 12/05/2007    PCP: Dr. Glendia Fielding  REFERRING PROVIDER: Dr. Taft Minerva  ONSET DATE: Chronic-months  REFERRING DIAG: M25.511,G89.29 (ICD-10-CM) - Chronic right shoulder pain   THERAPY DIAG:  Acute pain of right shoulder  Other symptoms and signs involving the musculoskeletal system  Rationale for Evaluation and Treatment: Rehabilitation  SUBJECTIVE:   SUBJECTIVE STATEMENT: S: It's been bothering me for months.  Pt accompanied by: self  PERTINENT HISTORY: Pt is a 73 y/o female presenting with right shoulder pain that has been present for months. Pt received a bicep tendon injection on 06/01/23 with some improvement in symptoms.   PRECAUTIONS: None  WEIGHT  BEARING RESTRICTIONS: No  PAIN:  Are you having pain? Yes: NPRS scale: 8/10 Pain location: anterior shoulder Pain description: aching, sore at injection site Aggravating factors: reaching back and into er Relieving factors: arthritis  FALLS: Has patient fallen in last 6 months? No  PLOF: Independent  PATIENT GOALS: To have less pain in the RUE  NEXT MD VISIT: as needed  OBJECTIVE:   HAND DOMINANCE:  Right  ADLs: Overall ADLs: Pt is having difficulty reaching overhead and behind back, has difficulty with reaching into overhead cabinet. Pt has pain with lifting tasks such as laundry or picking up a trash bag.   FUNCTIONAL OUTCOME MEASURES: FOTO: 62/100  UPPER EXTREMITY ROM:       Assessed in sitting, er/IR adducted  Active ROM Right eval  Shoulder flexion 135  Shoulder abduction 121  Shoulder internal rotation 90  Shoulder external rotation 58  (Blank rows = not tested)    Assessed in supine, er/IR adducted  Passive ROM Right eval  Shoulder flexion 151  Shoulder abduction 142  Shoulder internal rotation 90  Shoulder external rotation 83  (Blank rows = not tested)   UPPER EXTREMITY MMT:     Assessed in sitting, er/IR adducted  MMT Right eval  Shoulder flexion 4+/5  Shoulder abduction 4/5  Shoulder internal rotation 5/5  Shoulder external rotation 4/5  (Blank rows = not tested)  HAND FUNCTION: Grip strength: Right: 36 lbs; Left: 45 lbs  EDEMA: None  COGNITION: Overall cognitive status: Within functional limits for tasks assessed  OBSERVATIONS: mod fascial restrictions along right upper arm, anterior shoulder, trapezius, and scapular regions   TODAY'S TREATMENT:                                                                                                                              DATE:  -AA/ROM: seated-protraction, flexion, abduction, horizontal abduction, er, 10 reps    PATIENT EDUCATION: Education details: AA/ROM Person educated: Patient Education method: Programmer, Multimedia, Facilities Manager, and Handouts Education comprehension: verbalized understanding and returned demonstration  HOME EXERCISE PROGRAM: Eval: AA/ROM  GOALS: Goals reviewed with patient? Yes   SHORT TERM GOALS: Target date: 07/06/23  Pt will be provided with and educated on HEP to improve mobility in RUE required for use during ADL completion.   Goal status: INITIAL  2.  Pt will  increase RUE A/ROM by 15+ degrees to improve ability to use RUE during dressing and bathing tasks with minimal compensatory strategies.   Goal status: INITIAL  3.  Pt will increase RUE strength to 4+/5 greater to improve ability to use RUE when lifting or carrying items during meal preparation/housework/yardwork tasks  Goal status: INITIAL  4. Pt will decrease pain in RUE to 3/10 or less to improve ability to sleep for 2+ consecutive hours without waking due to pain.   Goal status: INITIAL  5.  Pt will decrease RUE fascial restrictions to min amounts or less to improve mobility required for  functional reaching tasks.   Goal status: INITIAL    ASSESSMENT:  CLINICAL IMPRESSION: Patient is a 73 y.o. female who was seen today for occupational therapy evaluation for right shoulder pain. Pt presents with increased pain and fascial restrictions, decreased ROM, strength, and functional use of the RUE during ADLs. Pt with high pain during ADLs and reaching tasks, reports some improvement since injection. Initiated HEP for AA/ROM this visit.    PERFORMANCE DEFICITS: in functional skills including in functional skills including ADLs, IADLs, coordination, tone, ROM, strength, pain, fascial restrictions, muscle spasms, and UE functional use  IMPAIRMENTS: are limiting patient from ADLs, IADLs, rest and sleep, and leisure.   COMORBIDITIES: has no other co-morbidities that affects occupational performance. Patient will benefit from skilled OT to address above impairments and improve overall function.  MODIFICATION OR ASSISTANCE TO COMPLETE EVALUATION: No modification of tasks or assist necessary to complete an evaluation.  OT OCCUPATIONAL PROFILE AND HISTORY: Problem focused assessment: Including review of records relating to presenting problem.  CLINICAL DECISION MAKING: LOW - limited treatment options, no task modification necessary  REHAB POTENTIAL: Good  EVALUATION COMPLEXITY:  Low      PLAN:  OT FREQUENCY: 2x/week  OT DURATION: 4 weeks  PLANNED INTERVENTIONS: 97168 OT Re-evaluation, 97535 self care/ADL training, 02889 therapeutic exercise, 97530 therapeutic activity, 97140 manual therapy, 97035 ultrasound, 97014 electrical stimulation unattended, patient/family education, and DME and/or AE instructions  RECOMMENDED OTHER SERVICES: None at this time  CONSULTED AND AGREED WITH PLAN OF CARE: Patient  PLAN FOR NEXT SESSION: Follow up on HEP, initiate manual techniques, AA/ROM to A/ROM   Sonny Cory, OTR/L  [phone] 06/06/2023, 2:51 PM    [employer] Medicare Auth Request Information  Date of referral: 06/01/23 Referring provider: Dr. Taft Minerva Referring diagnosis (ICD 10)? M25.511,G89.29 (ICD-10-CM) - Chronic right shoulder pain  Treatment diagnosis (ICD 10)? (if different than referring diagnosis) M25.511, R29.898  Functional Tool Score: FOTO: 62/100  What was this (referring dx) caused by? Ongoing Issue and Unspecified  Nature of Condition: Chronic (continuous duration > 3 months)   Laterality: Rt  Objective measurements identify impairments when they are compared to normal values, the uninvolved extremity, and prior level of function.  [x]  Yes  []  No  Objective assessment of functional ability: Minimal functional limitations   Briefly describe symptoms: pain, weakness of RUE  How did symptoms start: unsure  Average pain intensity:  Last 24 hours: 8/10  Past week: 8/10  How often does the pt experience symptoms? Frequently  How much have the symptoms interfered with usual daily activities? Quite a bit  How has condition changed since care began at this facility? NA - initial visit  In general, how is the patients overall health? Good   BACK PAIN (STarT Back Screening Tool) No

## 2023-06-06 NOTE — Patient Instructions (Signed)

## 2023-06-08 ENCOUNTER — Encounter (HOSPITAL_COMMUNITY): Payer: Self-pay | Admitting: Occupational Therapy

## 2023-06-08 ENCOUNTER — Ambulatory Visit (HOSPITAL_COMMUNITY): Payer: Medicare Other | Admitting: Occupational Therapy

## 2023-06-08 DIAGNOSIS — R29898 Other symptoms and signs involving the musculoskeletal system: Secondary | ICD-10-CM | POA: Diagnosis not present

## 2023-06-08 DIAGNOSIS — M25511 Pain in right shoulder: Secondary | ICD-10-CM | POA: Diagnosis not present

## 2023-06-08 NOTE — Therapy (Signed)
 OUTPATIENT OCCUPATIONAL THERAPY ORTHO TREATMENT NOTE  Patient Name: Aimee Dixon MRN: [id number] DOB:1951-01-27, 73 y.o., female Today's Date: 06/08/2023   END OF SESSION:  OT End of Session - 06/08/23 1435     Visit Number 2    Number of Visits 8    Date for OT Re-Evaluation 07/06/23    Authorization Type [employer] Medicare, $20 copay    Authorization Time Period requesting visits    Progress Note Due on Visit 10    OT Start Time 1350    OT Stop Time 1430    OT Time Calculation (min) 40 min    Activity Tolerance Patient tolerated treatment well    Behavior During Therapy WFL for tasks assessed/performed              Past Medical History:  Diagnosis Date   Hyperlipidemia    Hypertension    IDA (iron deficiency anemia)    Tubular adenoma of colon    Past Surgical History:  Procedure Laterality Date   ABDOMINAL HYSTERECTOMY     BIOPSY  03/26/2020   Procedure: BIOPSY;  Surgeon: Shaaron Lamar HERO, MD;  Location: [facility];  Service: Endoscopy;;   COLONOSCOPY  06/30/2003   MFM:Jwjo papilla/Otherwise,normal rectum/Pedunculated polyp at middescending colon/Smaller pedunculated polyp at hepatic flexure, cold snared   COLONOSCOPY   08/07/2006   MFM:Fpwpfjo internal hemorrhoids, otherwise normal rectum/Diminutive ascending colon adenomatous polyps   COLONOSCOPY  08/03/2009   RMR: tubular adenomas removed from hepatic flexure & desc colon, internal hemorrhoids and anal papilla otherwise norma;   COLONOSCOPY N/A 10/03/2012   Procedure: COLONOSCOPY;  Surgeon: Lamar HERO Shaaron, MD;  Location: [facility];  Service: Endoscopy;  Laterality: N/A;  2:15   COLONOSCOPY N/A 01/17/2018   Three 4-7 mm polyps in descending colon and at hepatic flexure, s/p removal. Tubular adenomas. Surveillance in 2022.    COLONOSCOPY WITH PROPOFOL  N/A 03/26/2020    one 10 mm polyp (tubular adenoma), otherwise normal, s/p segmental biopsies (benign)   POLYPECTOMY  01/17/2018   Procedure: POLYPECTOMY;   Surgeon: Shaaron Lamar HERO, MD;  Location: [facility];  Service: Endoscopy;;  hepatic flexure x2;descending colon   POLYPECTOMY  03/26/2020   Procedure: POLYPECTOMY;  Surgeon: Shaaron Lamar HERO, MD;  Location: [facility];  Service: Endoscopy;;   Patient Active Problem List   Diagnosis Date Noted   Chronic diarrhea 06/24/2020   Change in bowel habits 11/22/2019   GERD (gastroesophageal reflux disease) 11/22/2019   Vitamin D  deficiency 09/22/2016   Onychomycosis 09/09/2015   Morbid obesity (HCC) 08/09/2014   Pain in joint, hand 10/28/2013   Muscle weakness (generalized) 10/28/2013   Lack of coordination 10/28/2013   Prediabetes 08/02/2013   HTN (hypertension), benign 08/02/2013   Hyperlipidemia 08/02/2013   Arthralgia of hand 08/02/2013   Tubular adenoma of colon    KNEE, ARTHRITIS, DEGEN./OSTEO 12/05/2007    PCP: Dr. Glendia Fielding  REFERRING PROVIDER: Dr. Taft Minerva  ONSET DATE: Chronic-months  REFERRING DIAG: M25.511,G89.29 (ICD-10-CM) - Chronic right shoulder pain   THERAPY DIAG:  Acute pain of right shoulder  Other symptoms and signs involving the musculoskeletal system  Rationale for Evaluation and Treatment: Rehabilitation  SUBJECTIVE:   SUBJECTIVE STATEMENT: S: It's been bothering me for months.  Pt accompanied by: self  PERTINENT HISTORY: Pt is a 73 y/o female presenting with right shoulder pain that has been present for months. Pt received a bicep tendon injection on 06/01/23 with some improvement in symptoms.   PRECAUTIONS: None  WEIGHT BEARING RESTRICTIONS: No  PAIN:  Are you having pain? Yes: NPRS scale: 8/10 Pain location: anterior shoulder Pain description: aching, sore at injection site Aggravating factors: reaching back and into er Relieving factors: arthritis  FALLS: Has patient fallen in last 6 months? No  PLOF: Independent  PATIENT GOALS: To have less pain in the RUE  NEXT MD VISIT: as needed  OBJECTIVE:   HAND DOMINANCE:  Right  ADLs: Overall ADLs: Pt is having difficulty reaching overhead and behind back, has difficulty with reaching into overhead cabinet. Pt has pain with lifting tasks such as laundry or picking up a trash bag.   FUNCTIONAL OUTCOME MEASURES: FOTO: 62/100  UPPER EXTREMITY ROM:       Assessed in sitting, er/IR adducted  Active ROM Right eval  Shoulder flexion 135  Shoulder abduction 121  Shoulder internal rotation 90  Shoulder external rotation 58  (Blank rows = not tested)    Assessed in supine, er/IR adducted  Passive ROM Right eval  Shoulder flexion 151  Shoulder abduction 142  Shoulder internal rotation 90  Shoulder external rotation 83  (Blank rows = not tested)   UPPER EXTREMITY MMT:     Assessed in sitting, er/IR adducted  MMT Right eval  Shoulder flexion 4+/5  Shoulder abduction 4/5  Shoulder internal rotation 5/5  Shoulder external rotation 4/5  (Blank rows = not tested)  HAND FUNCTION: Grip strength: Right: 36 lbs; Left: 45 lbs  EDEMA: None  COGNITION: Overall cognitive status: Within functional limits for tasks assessed  OBSERVATIONS: mod fascial restrictions along right upper arm, anterior shoulder, trapezius, and scapular regions   TODAY'S TREATMENT:                                                                                                                              DATE:   06/08/23 -Manual therapy: myofascial release and trigger point massage applied to biceps, trapezius, and scapular region, in order to reduce fascial restrictions and pain, as well as to improve ROM.  -P/ROM: supine, flexion, abduction, er/IR, x6 -A/ROM: supine, flexion, abduction, Protraction, horizontal abduction, ER/IR, x10 -Proximal Shoulder Exercises: paddles, criss cross, circles both directions, x10 -Isometrics: flexion, abduction, extension, IR, 4x10 -UBE: level 1, 3.0 KPH+, 2.5' forwards and backwards   PATIENT EDUCATION: Education details: A/ROM and  Isometrics Person educated: Patient Education method: Programmer, Multimedia, Facilities Manager, and Handouts Education comprehension: verbalized understanding and returned demonstration  HOME EXERCISE PROGRAM: Eval: AA/ROM 1/9: A/ROM, Isometrics  GOALS: Goals reviewed with patient? Yes   SHORT TERM GOALS: Target date: 07/06/23  Pt will be provided with and educated on HEP to improve mobility in RUE required for use during ADL completion.   Goal status: IN PROGRESS  2.  Pt will increase RUE A/ROM by 15+ degrees to improve ability to use RUE during dressing and bathing tasks with minimal compensatory strategies.   Goal status: IN PROGRESS  3.  Pt will increase RUE strength to 4+/5  greater to improve ability to use RUE when lifting or carrying items during meal preparation/housework/yardwork tasks  Goal status: IN PROGRESS  4. Pt will decrease pain in RUE to 3/10 or less to improve ability to sleep for 2+ consecutive hours without waking due to pain.   Goal status: IN PROGRESS  5.  Pt will decrease RUE fascial restrictions to min amounts or less to improve mobility required for functional reaching tasks.   Goal status: IN PROGRESS    ASSESSMENT:  CLINICAL IMPRESSION: Pt presented to her first OT session with good ROM, however continued high levels of pain with manual therapy and mobility. She was able to progress with A/ROM and Isometrics this session, with mild to moderate pain. Isometrics provided good muscle tension and support, per pt. OT providing verbal and tactile cuing provided for positioning and technique throughout session.   PERFORMANCE DEFICITS: in functional skills including in functional skills including ADLs, IADLs, coordination, tone, ROM, strength, pain, fascial restrictions, muscle spasms, and UE functional use   PLAN:  OT FREQUENCY: 2x/week  OT DURATION: 4 weeks  PLANNED INTERVENTIONS: 97168 OT Re-evaluation, 97535 self care/ADL training, 02889 therapeutic  exercise, 97530 therapeutic activity, 97140 manual therapy, 97035 ultrasound, 97014 electrical stimulation unattended, patient/family education, and DME and/or AE instructions  RECOMMENDED OTHER SERVICES: None at this time  CONSULTED AND AGREED WITH PLAN OF CARE: Patient  PLAN FOR NEXT SESSION: Follow up on HEP, initiate manual techniques, AA/ROM to A/ROM   Valentin Nightingale, OTR/L  [phone] 06/08/2023, 2:36 PM

## 2023-06-08 NOTE — Patient Instructions (Signed)
 Repeat all exercises 10-15 times, 1-2 times per day.  1) Shoulder Protraction    Begin with elbows by your side, slowly punch straight out in front of you.      2) Shoulder Flexion  Supine:     Standing:         Begin with arms at your side with thumbs pointed up, slowly raise both arms up and forward towards overhead.               3) Horizontal abduction/adduction  Supine:   Standing:           Begin with arms straight out in front of you, bring out to the side in at T shape. Keep arms straight entire time.                 4) Internal & External Rotation   Supine:     Standing:     Stand with elbows at the side and elbows bent 90 degrees. Move your forearms away from your body, then bring back inward toward the body.     5) Shoulder Abduction  Supine:     Standing:       Lying on your back begin with your arms flat on the table next to your side. Slowly move your arms out to the side so that they go overhead, in a jumping jack or snow angel movement.    Complete the following 2x a day. Hold for 10-15 seconds. Complete 4-6 sets for each.   1) SHOULDER - ISOMETRIC FLEXION  Gently push your fist forward into a wall with your elbow bent.    2) SHOULDER - ISOMETRIC EXTENSION  Gently push your a bent elbow back into a wall.    3) SHOULDER - ISOMETRIC INTERNAL ROTATION   Gently press your hand into a wall using the palm side of your hand.  Maintain a bent elbow the entire time.        4) SHOULDER - ISOMETRIC ADDUCTION  Gently push your elbow into the side of your body.   5) SHOULDER - ISOMETRIC ABDUCTION  Gently push your elbow out to the side into a wall with your elbow bent.

## 2023-06-09 ENCOUNTER — Other Ambulatory Visit: Payer: Self-pay | Admitting: Family Medicine

## 2023-06-09 ENCOUNTER — Encounter: Payer: Self-pay | Admitting: Family Medicine

## 2023-06-12 ENCOUNTER — Encounter (HOSPITAL_COMMUNITY): Payer: Medicare Other | Admitting: Occupational Therapy

## 2023-06-15 ENCOUNTER — Ambulatory Visit (HOSPITAL_COMMUNITY): Payer: Medicare Other | Admitting: Occupational Therapy

## 2023-06-15 ENCOUNTER — Encounter (HOSPITAL_COMMUNITY): Payer: Self-pay | Admitting: Occupational Therapy

## 2023-06-15 DIAGNOSIS — M25511 Pain in right shoulder: Secondary | ICD-10-CM

## 2023-06-15 DIAGNOSIS — R29898 Other symptoms and signs involving the musculoskeletal system: Secondary | ICD-10-CM | POA: Diagnosis not present

## 2023-06-15 NOTE — Therapy (Signed)
OUTPATIENT OCCUPATIONAL THERAPY ORTHO TREATMENT NOTE  Patient Name: Aimee Dixon MRN: [id number] DOB:[birth year], 73 y.o., female Today's Date: 06/16/2023   END OF SESSION:  OT End of Session - 06/15/23 1345     Visit Number 3    Number of Visits 8    Date for OT Re-Evaluation 07/06/23    Authorization Type [employer] Medicare, $20 copay    Authorization Time Period 8 visits approved (06/06/23-07/04/23)    Authorization - Visit Number 2    Authorization - Number of Visits 8    Progress Note Due on Visit 10    OT Start Time 1305    OT Stop Time 1345    OT Time Calculation (min) 40 min    Activity Tolerance Patient tolerated treatment well    Behavior During Therapy WFL for tasks assessed/performed             Past Medical History:  Diagnosis Date   Hyperlipidemia    Hypertension    IDA (iron deficiency anemia)    Tubular adenoma of colon    Past Surgical History:  Procedure Laterality Date   ABDOMINAL HYSTERECTOMY     BIOPSY  03/26/2020   Procedure: BIOPSY;  Surgeon: Corbin Ade, MD;  Location: [facility];  Service: Endoscopy;;   COLONOSCOPY  06/30/2003   QMV:HQIO papilla/Otherwise,normal rectum/Pedunculated polyp at middescending colon/Smaller pedunculated polyp at hepatic flexure, cold snared   COLONOSCOPY   08/07/2006   NGE:XBMWUXL internal hemorrhoids, otherwise normal rectum/Diminutive ascending colon adenomatous polyps   COLONOSCOPY  08/03/2009   RMR: tubular adenomas removed from hepatic flexure & desc colon, internal hemorrhoids and anal papilla otherwise norma;   COLONOSCOPY N/A 10/03/2012   Procedure: COLONOSCOPY;  Surgeon: Corbin Ade, MD;  Location: [facility];  Service: Endoscopy;  Laterality: N/A;  2:15   COLONOSCOPY N/A 01/17/2018   Three 4-7 mm polyps in descending colon and at hepatic flexure, s/p removal. Tubular adenomas. Surveillance in 2022.    COLONOSCOPY WITH PROPOFOL N/A 03/26/2020    one 10 mm polyp (tubular adenoma), otherwise normal,  s/p segmental biopsies (benign)   POLYPECTOMY  01/17/2018   Procedure: POLYPECTOMY;  Surgeon: Corbin Ade, MD;  [facility]: [facility];  Service: Endoscopy;;  hepatic flexure x2;descending colon   POLYPECTOMY  03/26/2020   Procedure: POLYPECTOMY;  Surgeon: Corbin Ade, MD;  Location: [facility];  Service: Endoscopy;;   Patient Active Problem List   Diagnosis Date Noted   Chronic diarrhea 06/24/2020   Change in bowel habits 11/22/2019   GERD (gastroesophageal reflux disease) 11/22/2019   Vitamin D deficiency 09/22/2016   Onychomycosis 09/09/2015   Morbid obesity (HCC) 08/09/2014   Pain in joint, hand 10/28/2013   Muscle weakness (generalized) 10/28/2013   Lack of coordination 10/28/2013   Prediabetes 08/02/2013   HTN (hypertension), benign 08/02/2013   Hyperlipidemia 08/02/2013   Arthralgia of hand 08/02/2013   Tubular adenoma of colon    KNEE, ARTHRITIS, DEGEN./OSTEO 12/05/2007    PCP: Dr. Lilyan Punt  REFERRING PROVIDER: Dr. Fuller Canada  ONSET DATE: Chronic-months  REFERRING DIAG: M25.511,G89.29 (ICD-10-CM) - Chronic right shoulder pain   THERAPY DIAG:  Acute pain of right shoulder  Other symptoms and signs involving the musculoskeletal system  Rationale for Evaluation and Treatment: Rehabilitation  SUBJECTIVE:   SUBJECTIVE STATEMENT: S: "It's not feeling that bad" Pt accompanied by: self  PERTINENT HISTORY: Pt is a 73 y/o female presenting with right shoulder pain that has been present for months. Pt  received a bicep tendon injection on 06/01/23 with some improvement in symptoms.   PRECAUTIONS: None  WEIGHT BEARING RESTRICTIONS: No  PAIN:  Are you having pain? Yes: NPRS scale: 5/10 Pain location: anterior shoulder Pain description: aching, sore at injection site Aggravating factors: reaching back and into er Relieving factors: arthritis  FALLS: Has patient fallen in last 6 months? No  PLOF: Independent  PATIENT GOALS: To have less pain  in the RUE  NEXT MD VISIT: as needed  OBJECTIVE:   HAND DOMINANCE: Right  ADLs: Overall ADLs: Pt is having difficulty reaching overhead and behind back, has difficulty with reaching into overhead cabinet. Pt has pain with lifting tasks such as laundry or picking up a trash bag.   FUNCTIONAL OUTCOME MEASURES: FOTO: 62/100  UPPER EXTREMITY ROM:       Assessed in sitting, er/IR adducted  Active ROM Right eval  Shoulder flexion 135  Shoulder abduction 121  Shoulder internal rotation 90  Shoulder external rotation 58  (Blank rows = not tested)    Assessed in supine, er/IR adducted  Passive ROM Right eval  Shoulder flexion 151  Shoulder abduction 142  Shoulder internal rotation 90  Shoulder external rotation 83  (Blank rows = not tested)   UPPER EXTREMITY MMT:     Assessed in sitting, er/IR adducted  MMT Right eval  Shoulder flexion 4+/5  Shoulder abduction 4/5  Shoulder internal rotation 5/5  Shoulder external rotation 4/5  (Blank rows = not tested)  HAND FUNCTION: Grip strength: Right: 36 lbs; Left: 45 lbs  EDEMA: None  COGNITION: Overall cognitive status: Within functional limits for tasks assessed  OBSERVATIONS: mod fascial restrictions along right upper arm, anterior shoulder, trapezius, and scapular regions   TODAY'S TREATMENT:                                                                                                                              DATE:   06/15/23 -Manual therapy: myofascial release and trigger point massage applied to biceps, trapezius, and scapular region, in order to reduce fascial restrictions and pain, as well as to improve ROM.  -A/ROM: supine, flexion, abduction, Protraction, horizontal abduction, ER/IR, x10 -Functional Reaching: 1# wrist weight, x10 cones, flexion up 1st and 2nd shelf, abduction back down -Scapular Strengthening: red band, extension, retraction, rows, x10 -Theraball Exercises: basket ball, flexion,  protraction, overhead press, V ups, x10 -Wall Slides: flexion, x10  06/08/23 -Manual therapy: myofascial release and trigger point massage applied to biceps, trapezius, and scapular region, in order to reduce fascial restrictions and pain, as well as to improve ROM.  -P/ROM: supine, flexion, abduction, er/IR, x6 -A/ROM: supine, flexion, abduction, Protraction, horizontal abduction, ER/IR, x10 -Proximal Shoulder Exercises: paddles, criss cross, circles both directions, x10 -Isometrics: flexion, abduction, extension, IR, 4x10" -UBE: level 1, 3.0 KPH+, 2.5' forwards and backwards   PATIENT EDUCATION: Education details: A/ROM and Isometrics Person educated: Patient Education method: Explanation, Facilities manager, and Handouts Education comprehension: verbalized  understanding and returned demonstration  HOME EXERCISE PROGRAM: Eval: AA/ROM 1/9: A/ROM, Isometrics  GOALS: Goals reviewed with patient? Yes   SHORT TERM GOALS: Target date: 07/06/23  Pt will be provided with and educated on HEP to improve mobility in RUE required for use during ADL completion.   Goal status: IN PROGRESS  2.  Pt will increase RUE A/ROM by 15+ degrees to improve ability to use RUE during dressing and bathing tasks with minimal compensatory strategies.   Goal status: IN PROGRESS  3.  Pt will increase RUE strength to 4+/5 greater to improve ability to use RUE when lifting or carrying items during meal preparation/housework/yardwork tasks  Goal status: IN PROGRESS  4. Pt will decrease pain in RUE to 3/10 or less to improve ability to sleep for 2+ consecutive hours without waking due to pain.   Goal status: IN PROGRESS  5.  Pt will decrease RUE fascial restrictions to min amounts or less to improve mobility required for functional reaching tasks.   Goal status: IN PROGRESS    ASSESSMENT:  CLINICAL IMPRESSION: This session pt continuing to improve with ROM and strengthening. She is reporting continued  decreased pain, however has periods of sharp pain with increased movement. Pt was able to add scapular strengthening and theraball strengthening, with moderate increase in pain and fatigue. OT providing verbal and tactile cuing throughout session for positioning and technique.   PERFORMANCE DEFICITS: in functional skills including in functional skills including ADLs, IADLs, coordination, tone, ROM, strength, pain, fascial restrictions, muscle spasms, and UE functional use   PLAN:  OT FREQUENCY: 2x/week  OT DURATION: 4 weeks  PLANNED INTERVENTIONS: 97168 OT Re-evaluation, 97535 self care/ADL training, 16109 therapeutic exercise, 97530 therapeutic activity, 97140 manual therapy, 97035 ultrasound, 97014 electrical stimulation unattended, patient/family education, and DME and/or AE instructions  RECOMMENDED OTHER SERVICES: None at this time  CONSULTED AND AGREED WITH PLAN OF CARE: Patient  PLAN FOR NEXT SESSION: Follow up on HEP, initiate manual techniques, AA/ROM to A/ROM   Trish Mage, OTR/L  [phone] 06/16/2023, 2:36 PM

## 2023-06-15 NOTE — Patient Instructions (Signed)

## 2023-06-19 ENCOUNTER — Encounter (HOSPITAL_COMMUNITY): Payer: Self-pay | Admitting: Occupational Therapy

## 2023-06-19 ENCOUNTER — Ambulatory Visit (HOSPITAL_COMMUNITY): Payer: Medicare Other | Admitting: Occupational Therapy

## 2023-06-19 DIAGNOSIS — R29898 Other symptoms and signs involving the musculoskeletal system: Secondary | ICD-10-CM | POA: Diagnosis not present

## 2023-06-19 DIAGNOSIS — M25511 Pain in right shoulder: Secondary | ICD-10-CM | POA: Diagnosis not present

## 2023-06-19 NOTE — Therapy (Signed)
OUTPATIENT OCCUPATIONAL THERAPY ORTHO TREATMENT NOTE  Patient Name: Aimee Dixon MRN: [id number] DOB:30-Jan-1951, 73 y.o., female Today's Date: 06/19/2023   END OF SESSION:  OT End of Session - 06/19/23 1343     Visit Number 4    Number of Visits 8    Date for OT Re-Evaluation 07/06/23    Authorization Type [employer] Medicare, $20 copay    Authorization Time Period 8 visits approved (06/06/23-07/04/23)    Authorization - Visit Number 3    Authorization - Number of Visits 8    Progress Note Due on Visit 10    OT Start Time 1302    OT Stop Time 1341    OT Time Calculation (min) 39 min    Activity Tolerance Patient tolerated treatment well    Behavior During Therapy WFL for tasks assessed/performed              Past Medical History:  Diagnosis Date   Hyperlipidemia    Hypertension    IDA (iron deficiency anemia)    Tubular adenoma of colon    Past Surgical History:  Procedure Laterality Date   ABDOMINAL HYSTERECTOMY     BIOPSY  03/26/2020   Procedure: BIOPSY;  Surgeon: Corbin Ade, MD;  Location: [facility];  Service: Endoscopy;;   COLONOSCOPY  06/30/2003   FAO:ZHYQ papilla/Otherwise,normal rectum/Pedunculated polyp at middescending colon/Smaller pedunculated polyp at hepatic flexure, cold snared   COLONOSCOPY   08/07/2006   MVH:QIONGEX internal hemorrhoids, otherwise normal rectum/Diminutive ascending colon adenomatous polyps   COLONOSCOPY  08/03/2009   RMR: tubular adenomas removed from hepatic flexure & desc colon, internal hemorrhoids and anal papilla otherwise norma;   COLONOSCOPY N/A 10/03/2012   Procedure: COLONOSCOPY;  Surgeon: Corbin Ade, MD;  Location: [facility];  Service: Endoscopy;  Laterality: N/A;  2:15   COLONOSCOPY N/A 01/17/2018   Three 4-7 mm polyps in descending colon and at hepatic flexure, s/p removal. Tubular adenomas. Surveillance in 2022.    COLONOSCOPY WITH PROPOFOL N/A 03/26/2020    one 10 mm polyp (tubular adenoma), otherwise normal,  s/p segmental biopsies (benign)   POLYPECTOMY  01/17/2018   Procedure: POLYPECTOMY;  Surgeon: Corbin Ade, MD;  Location: [facility];  Service: Endoscopy;;  hepatic flexure x2;descending colon   POLYPECTOMY  03/26/2020   Procedure: POLYPECTOMY;  Surgeon: Corbin Ade, MD;  Location: [facility];  Service: Endoscopy;;   Patient Active Problem List   Diagnosis Date Noted   Chronic diarrhea 06/24/2020   Change in bowel habits 11/22/2019   GERD (gastroesophageal reflux disease) 11/22/2019   Vitamin D deficiency 09/22/2016   Onychomycosis 09/09/2015   Morbid obesity (HCC) 08/09/2014   Pain in joint, hand 10/28/2013   Muscle weakness (generalized) 10/28/2013   Lack of coordination 10/28/2013   Prediabetes 08/02/2013   HTN (hypertension), benign 08/02/2013   Hyperlipidemia 08/02/2013   Arthralgia of hand 08/02/2013   Tubular adenoma of colon    KNEE, ARTHRITIS, DEGEN./OSTEO 12/05/2007    PCP: Dr. Lilyan Punt  REFERRING PROVIDER: Dr. Fuller Canada  ONSET DATE: Chronic-months  REFERRING DIAG: M25.511,G89.29 (ICD-10-CM) - Chronic right shoulder pain   THERAPY DIAG:  Acute pain of right shoulder  Other symptoms and signs involving the musculoskeletal system  Rationale for Evaluation and Treatment: Rehabilitation  SUBJECTIVE:   SUBJECTIVE STATEMENT: S: "It's a little stiff, but it's not like it was."  PERTINENT HISTORY: Pt is a 73 y/o female presenting with right shoulder pain that has been present for  months. Pt received a bicep tendon injection on 06/01/23 with some improvement in symptoms.   PRECAUTIONS: None  WEIGHT BEARING RESTRICTIONS: No  PAIN:  Are you having pain? Yes: NPRS scale: 5/10 Pain location: anterior shoulder Pain description: aching, sore at injection site Aggravating factors: reaching back and into er Relieving factors: arthritis  FALLS: Has patient fallen in last 6 months? No  PLOF: Independent  PATIENT GOALS: To have less pain in  the RUE  NEXT MD VISIT: as needed  OBJECTIVE:   HAND DOMINANCE: Right  ADLs: Overall ADLs: Pt is having difficulty reaching overhead and behind back, has difficulty with reaching into overhead cabinet. Pt has pain with lifting tasks such as laundry or picking up a trash bag.   FUNCTIONAL OUTCOME MEASURES: FOTO: 62/100  UPPER EXTREMITY ROM:       Assessed in sitting, er/IR adducted  Active ROM Right eval  Shoulder flexion 135  Shoulder abduction 121  Shoulder internal rotation 90  Shoulder external rotation 58  (Blank rows = not tested)    Assessed in supine, er/IR adducted  Passive ROM Right eval  Shoulder flexion 151  Shoulder abduction 142  Shoulder internal rotation 90  Shoulder external rotation 83  (Blank rows = not tested)   UPPER EXTREMITY MMT:     Assessed in sitting, er/IR adducted  MMT Right eval  Shoulder flexion 4+/5  Shoulder abduction 4/5  Shoulder internal rotation 5/5  Shoulder external rotation 4/5  (Blank rows = not tested)  HAND FUNCTION: Grip strength: Right: 36 lbs; Left: 45 lbs  EDEMA: None  COGNITION: Overall cognitive status: Within functional limits for tasks assessed  OBSERVATIONS: mod fascial restrictions along right upper arm, anterior shoulder, trapezius, and scapular regions   TODAY'S TREATMENT:                                                                                                                              DATE:  06/19/23 -Manual therapy: myofascial release and trigger point massage applied to biceps, trapezius, and scapular region, in order to reduce fascial restrictions and pain, as well as to improve ROM.  -P/ROM: supine-flexion, abduction, er/IR, 5 reps -A/ROM: supine-flexion, abduction, protraction, horizontal abduction, er/IR, 12 reps -Proximal Shoulder Exercises: paddles, criss cross, circles both directions, 10 reps -A/ROM: standing-flexion, abduction, protraction, horizontal abduction, er/IR, 10  reps -Proximal shoulder strengthening: 90 degrees flexion on doorway -Scapular theraband: row, extension, retraction, 10 reps -UBE: level 1, 2' forward, 2' reverse, pace: 5.0  06/15/23 -Manual therapy: myofascial release and trigger point massage applied to biceps, trapezius, and scapular region, in order to reduce fascial restrictions and pain, as well as to improve ROM.  -A/ROM: supine, flexion, abduction, Protraction, horizontal abduction, ER/IR, x10 -Functional Reaching: 1# wrist weight, x10 cones, flexion up 1st and 2nd shelf, abduction back down -Scapular Strengthening: red band, extension, retraction, rows, x10 -Theraball Exercises: basket ball, flexion, protraction, overhead press, V ups, x10 -Wall Slides: flexion, x10  06/08/23 -Manual therapy: myofascial release and trigger point massage applied to biceps, trapezius, and scapular region, in order to reduce fascial restrictions and pain, as well as to improve ROM.  -P/ROM: supine, flexion, abduction, er/IR, x6 -A/ROM: supine, flexion, abduction, Protraction, horizontal abduction, ER/IR, x10 -Proximal Shoulder Exercises: paddles, criss cross, circles both directions, x10 -Isometrics: flexion, abduction, extension, IR, 4x10" -UBE: level 1, 3.0 KPH+, 2.5' forwards and backwards   PATIENT EDUCATION: Education details: red theraband scapular strengthening Person educated: Patient Education method: Explanation, Demonstration, and Handouts Education comprehension: verbalized understanding and returned demonstration  HOME EXERCISE PROGRAM: Eval: AA/ROM 1/9: A/ROM, Isometrics 1/20: red theraband scapular strengthening  GOALS: Goals reviewed with patient? Yes   SHORT TERM GOALS: Target date: 07/06/23  Pt will be provided with and educated on HEP to improve mobility in RUE required for use during ADL completion.   Goal status: IN PROGRESS  2.  Pt will increase RUE A/ROM by 15+ degrees to improve ability to use RUE during dressing  and bathing tasks with minimal compensatory strategies.   Goal status: IN PROGRESS  3.  Pt will increase RUE strength to 4+/5 greater to improve ability to use RUE when lifting or carrying items during meal preparation/housework/yardwork tasks  Goal status: IN PROGRESS  4. Pt will decrease pain in RUE to 3/10 or less to improve ability to sleep for 2+ consecutive hours without waking due to pain.   Goal status: IN PROGRESS  5.  Pt will decrease RUE fascial restrictions to min amounts or less to improve mobility required for functional reaching tasks.   Goal status: IN PROGRESS    ASSESSMENT:  CLINICAL IMPRESSION: Pt reports HEP is going well, has some anterior shoulder soreness today. Continued with A/ROM and added in standing. Pt completing scapular theraband, tactile cuing and visual demonstration required for form. Added proximal shoulder strengthening at doorway. OT providing verbal and tactile cuing throughout session for positioning and technique.   PERFORMANCE DEFICITS: in functional skills including in functional skills including ADLs, IADLs, coordination, tone, ROM, strength, pain, fascial restrictions, muscle spasms, and UE functional use   PLAN:  OT FREQUENCY: 2x/week  OT DURATION: 4 weeks  PLANNED INTERVENTIONS: 97168 OT Re-evaluation, 97535 self care/ADL training, 16109 therapeutic exercise, 97530 therapeutic activity, 97140 manual therapy, 97035 ultrasound, 97014 electrical stimulation unattended, patient/family education, and DME and/or AE instructions  CONSULTED AND AGREED WITH PLAN OF CARE: Patient  PLAN FOR NEXT SESSION: Follow up on HEP, initiate manual techniques, AA/ROM to A/ROM    Ezra Sites, OTR/L  [phone] 06/19/2023, 1:44 PM

## 2023-06-19 NOTE — Patient Instructions (Signed)

## 2023-06-22 ENCOUNTER — Ambulatory Visit (HOSPITAL_COMMUNITY): Payer: Medicare Other | Admitting: Occupational Therapy

## 2023-06-22 ENCOUNTER — Other Ambulatory Visit: Payer: Self-pay | Admitting: Family Medicine

## 2023-06-22 ENCOUNTER — Encounter (HOSPITAL_COMMUNITY): Payer: Self-pay | Admitting: Occupational Therapy

## 2023-06-22 DIAGNOSIS — M25511 Pain in right shoulder: Secondary | ICD-10-CM

## 2023-06-22 DIAGNOSIS — R29898 Other symptoms and signs involving the musculoskeletal system: Secondary | ICD-10-CM | POA: Diagnosis not present

## 2023-06-22 NOTE — Therapy (Signed)
OUTPATIENT OCCUPATIONAL THERAPY ORTHO TREATMENT NOTE  Patient Name: Aimee Dixon MRN: [id number] DOB:1951-05-03, 73 y.o., female Today's Date: 06/22/2023   END OF SESSION:  OT End of Session - 06/22/23 1341     Visit Number 5    Number of Visits 8    Date for OT Re-Evaluation 07/06/23    Authorization Type [employer] Medicare, $20 copay    Authorization Time Period 8 visits approved (06/06/23-07/04/23)    Authorization - Visit Number 4    Authorization - Number of Visits 8    Progress Note Due on Visit 10    OT Start Time 1300    OT Stop Time 1343    OT Time Calculation (min) 43 min    Activity Tolerance Patient tolerated treatment well    Behavior During Therapy WFL for tasks assessed/performed               Past Medical History:  Diagnosis Date   Hyperlipidemia    Hypertension    IDA (iron deficiency anemia)    Tubular adenoma of colon    Past Surgical History:  Procedure Laterality Date   ABDOMINAL HYSTERECTOMY     BIOPSY  03/26/2020   Procedure: BIOPSY;  Surgeon: Corbin Ade, MD;  Location: [facility];  Service: Endoscopy;;   COLONOSCOPY  06/30/2003   VFI:EPPI papilla/Otherwise,normal rectum/Pedunculated polyp at middescending colon/Smaller pedunculated polyp at hepatic flexure, cold snared   COLONOSCOPY   08/07/2006   RJJ:OACZYSA internal hemorrhoids, otherwise normal rectum/Diminutive ascending colon adenomatous polyps   COLONOSCOPY  08/03/2009   RMR: tubular adenomas removed from hepatic flexure & desc colon, internal hemorrhoids and anal papilla otherwise norma;   COLONOSCOPY N/A 10/03/2012   Procedure: COLONOSCOPY;  Surgeon: Corbin Ade, MD;  Location: [facility];  Service: Endoscopy;  Laterality: N/A;  2:15   COLONOSCOPY N/A 01/17/2018   Three 4-7 mm polyps in descending colon and at hepatic flexure, s/p removal. Tubular adenomas. Surveillance in 2022.    COLONOSCOPY WITH PROPOFOL N/A 03/26/2020    one 10 mm polyp (tubular adenoma), otherwise  normal, s/p segmental biopsies (benign)   POLYPECTOMY  01/17/2018   Procedure: POLYPECTOMY;  Surgeon: Corbin Ade, MD;  Location: [facility];  Service: Endoscopy;;  hepatic flexure x2;descending colon   POLYPECTOMY  03/26/2020   Procedure: POLYPECTOMY;  Surgeon: Corbin Ade, MD;  Location: [facility];  Service: Endoscopy;;   Patient Active Problem List   Diagnosis Date Noted   Chronic diarrhea 06/24/2020   Change in bowel habits 11/22/2019   GERD (gastroesophageal reflux disease) 11/22/2019   Vitamin D deficiency 09/22/2016   Onychomycosis 09/09/2015   Morbid obesity (HCC) 08/09/2014   Pain in joint, hand 10/28/2013   Muscle weakness (generalized) 10/28/2013   Lack of coordination 10/28/2013   Prediabetes 08/02/2013   HTN (hypertension), benign 08/02/2013   Hyperlipidemia 08/02/2013   Arthralgia of hand 08/02/2013   Tubular adenoma of colon    KNEE, ARTHRITIS, DEGEN./OSTEO 12/05/2007    PCP: Dr. Lilyan Punt  REFERRING PROVIDER: Dr. Fuller Canada  ONSET DATE: Chronic-months  REFERRING DIAG: M25.511,G89.29 (ICD-10-CM) - Chronic right shoulder pain   THERAPY DIAG:  Acute pain of right shoulder  Other symptoms and signs involving the musculoskeletal system  Rationale for Evaluation and Treatment: Rehabilitation  SUBJECTIVE:   SUBJECTIVE STATEMENT: S: "It's sore today."  PERTINENT HISTORY: Pt is a 73 y/o female presenting with right shoulder pain that has been present for months. Pt received a bicep tendon  injection on 06/01/23 with some improvement in symptoms.   PRECAUTIONS: None  WEIGHT BEARING RESTRICTIONS: No  PAIN:  Are you having pain? Yes: NPRS scale: 6/10 Pain location: anterior shoulder Pain description: aching, sore at injection site Aggravating factors: reaching back and into er Relieving factors: arthritis  FALLS: Has patient fallen in last 6 months? No  PLOF: Independent  PATIENT GOALS: To have less pain in the RUE  NEXT MD  VISIT: as needed  OBJECTIVE:   HAND DOMINANCE: Right  ADLs: Overall ADLs: Pt is having difficulty reaching overhead and behind back, has difficulty with reaching into overhead cabinet. Pt has pain with lifting tasks such as laundry or picking up a trash bag.   FUNCTIONAL OUTCOME MEASURES: FOTO: 62/100  UPPER EXTREMITY ROM:       Assessed in sitting, er/IR adducted  Active ROM Right eval  Shoulder flexion 135  Shoulder abduction 121  Shoulder internal rotation 90  Shoulder external rotation 58  (Blank rows = not tested)    Assessed in supine, er/IR adducted  Passive ROM Right eval  Shoulder flexion 151  Shoulder abduction 142  Shoulder internal rotation 90  Shoulder external rotation 83  (Blank rows = not tested)   UPPER EXTREMITY MMT:     Assessed in sitting, er/IR adducted  MMT Right eval  Shoulder flexion 4+/5  Shoulder abduction 4/5  Shoulder internal rotation 5/5  Shoulder external rotation 4/5  (Blank rows = not tested)  HAND FUNCTION: Grip strength: Right: 36 lbs; Left: 45 lbs   OBSERVATIONS: mod fascial restrictions along right upper arm, anterior shoulder, trapezius, and scapular regions   TODAY'S TREATMENT:                                                                                                                              DATE:  06/22/23 -Manual therapy: myofascial release and trigger point massage applied to biceps, trapezius, and scapular region, in order to reduce fascial restrictions and pain, as well as to improve ROM.  -P/ROM: supine-flexion, abduction, er/IR, 5 reps -A/ROM: supine-flexion, abduction, protraction, horizontal abduction, er/IR, 12 reps -Proximal Shoulder Exercises: paddles, criss cross, circles both directions, 10 reps -A/ROM: standing-flexion, abduction, protraction, horizontal abduction, er/IR, 10 reps -Functional reaching: pt placing 10 cones on middle shelf of overhead cabinet in flexion, removing in  abduction -Scapular theraband: red-row, extension, retraction, 10 reps -UBE: level 1, 2' forward, 2' reverse, pace: 5.0  06/19/23 -Manual therapy: myofascial release and trigger point massage applied to biceps, trapezius, and scapular region, in order to reduce fascial restrictions and pain, as well as to improve ROM.  -P/ROM: supine-flexion, abduction, er/IR, 5 reps -A/ROM: supine-flexion, abduction, protraction, horizontal abduction, er/IR, 12 reps -Proximal Shoulder Exercises: paddles, criss cross, circles both directions, 10 reps -A/ROM: standing-flexion, abduction, protraction, horizontal abduction, er/IR, 10 reps -Proximal shoulder strengthening: 90 degrees flexion on doorway -Scapular theraband: row, extension, retraction, 10 reps -UBE: level 1, 2' forward, 2' reverse, pace: 5.0  06/15/23 -Manual therapy: myofascial release and trigger point massage applied to biceps, trapezius, and scapular region, in order to reduce fascial restrictions and pain, as well as to improve ROM.  -A/ROM: supine, flexion, abduction, Protraction, horizontal abduction, ER/IR, x10 -Functional Reaching: 1# wrist weight, x10 cones, flexion up 1st and 2nd shelf, abduction back down -Scapular Strengthening: red band, extension, retraction, rows, x10 -Theraball Exercises: basket ball, flexion, protraction, overhead press, V ups, x10 -Wall Slides: flexion, x10   PATIENT EDUCATION: Education details: reviewed HEP Person educated: Patient Education method: Programmer, multimedia, Demonstration, and Handouts Education comprehension: verbalized understanding and returned demonstration  HOME EXERCISE PROGRAM: Eval: AA/ROM 1/9: A/ROM, Isometrics 1/20: red theraband scapular strengthening  GOALS: Goals reviewed with patient? Yes   SHORT TERM GOALS: Target date: 07/06/23  Pt will be provided with and educated on HEP to improve mobility in RUE required for use during ADL completion.   Goal status: IN PROGRESS  2.  Pt  will increase RUE A/ROM by 15+ degrees to improve ability to use RUE during dressing and bathing tasks with minimal compensatory strategies.   Goal status: IN PROGRESS  3.  Pt will increase RUE strength to 4+/5 greater to improve ability to use RUE when lifting or carrying items during meal preparation/housework/yardwork tasks  Goal status: IN PROGRESS  4. Pt will decrease pain in RUE to 3/10 or less to improve ability to sleep for 2+ consecutive hours without waking due to pain.   Goal status: IN PROGRESS  5.  Pt will decrease RUE fascial restrictions to min amounts or less to improve mobility required for functional reaching tasks.   Goal status: IN PROGRESS    ASSESSMENT:  CLINICAL IMPRESSION: Pt reports continued anterior shoulder soreness today, she is completing HEP. Continued with manual techniques and A/ROM, proximal shoulder strengthening. Intermittent popping noted during horizontal abduction. Added functional reaching and continued with scapular theraband-improvement in form today. Pt with pain at approximately 70-75% ROM for flexion, abduction, and horizontal abduction. OT providing verbal and tactile cuing throughout session for positioning and technique.    PERFORMANCE DEFICITS: in functional skills including in functional skills including ADLs, IADLs, coordination, tone, ROM, strength, pain, fascial restrictions, muscle spasms, and UE functional use   PLAN:  OT FREQUENCY: 2x/week  OT DURATION: 4 weeks  PLANNED INTERVENTIONS: 97168 OT Re-evaluation, 97535 self care/ADL training, 04540 therapeutic exercise, 97530 therapeutic activity, 97140 manual therapy, 97035 ultrasound, 97014 electrical stimulation unattended, patient/family education, and DME and/or AE instructions  CONSULTED AND AGREED WITH PLAN OF CARE: Patient  PLAN FOR NEXT SESSION: Follow up on HEP, manual techniques, A/ROM to strengthening as able to tolerate    Ezra Sites, OTR/L   [phone] 06/22/2023, 1:44 PM

## 2023-06-23 ENCOUNTER — Other Ambulatory Visit: Payer: Self-pay

## 2023-06-23 ENCOUNTER — Other Ambulatory Visit: Payer: Self-pay | Admitting: Family Medicine

## 2023-06-23 DIAGNOSIS — I10 Essential (primary) hypertension: Secondary | ICD-10-CM

## 2023-06-23 MED ORDER — VALSARTAN-HYDROCHLOROTHIAZIDE 160-25 MG PO TABS: 1.0000 | ORAL_TABLET | Freq: Every day | ORAL | 1 refills | Status: DC

## 2023-06-26 ENCOUNTER — Encounter (HOSPITAL_COMMUNITY): Payer: Self-pay | Admitting: Occupational Therapy

## 2023-06-26 ENCOUNTER — Ambulatory Visit (HOSPITAL_COMMUNITY): Payer: Medicare Other | Admitting: Occupational Therapy

## 2023-06-26 DIAGNOSIS — M25511 Pain in right shoulder: Secondary | ICD-10-CM

## 2023-06-26 DIAGNOSIS — R29898 Other symptoms and signs involving the musculoskeletal system: Secondary | ICD-10-CM

## 2023-06-26 NOTE — Therapy (Signed)
OUTPATIENT OCCUPATIONAL THERAPY ORTHO TREATMENT NOTE  Patient Name: Aimee Dixon MRN: [id number] DOB:06-Mar-1951, 73 y.o., female Today's Date: 06/26/2023   END OF SESSION:  OT End of Session - 06/26/23 1359     Visit Number 6    Number of Visits 8    Date for OT Re-Evaluation 07/06/23    Authorization Type [employer] Medicare, $20 copay    Authorization Time Period 8 visits approved (06/06/23-07/04/23)    Authorization - Visit Number 5    Authorization - Number of Visits 8    Progress Note Due on Visit 10    OT Start Time 1307    OT Stop Time 1349    OT Time Calculation (min) 42 min    Activity Tolerance Patient tolerated treatment well    Behavior During Therapy WFL for tasks assessed/performed                Past Medical History:  Diagnosis Date   Hyperlipidemia    Hypertension    IDA (iron deficiency anemia)    Tubular adenoma of colon    Past Surgical History:  Procedure Laterality Date   ABDOMINAL HYSTERECTOMY     BIOPSY  03/26/2020   Procedure: BIOPSY;  Surgeon: Corbin Ade, MD;  Location: [facility];  Service: Endoscopy;;   COLONOSCOPY  06/30/2003   ZDG:UYQI papilla/Otherwise,normal rectum/Pedunculated polyp at middescending colon/Smaller pedunculated polyp at hepatic flexure, cold snared   COLONOSCOPY   08/07/2006   HKV:[id number] internal hemorrhoids, otherwise normal rectum/Diminutive ascending colon adenomatous polyps   COLONOSCOPY  08/03/2009   RMR: tubular adenomas removed from hepatic flexure & desc colon, internal hemorrhoids and anal papilla otherwise norma;   COLONOSCOPY N/A 10/03/2012   Procedure: COLONOSCOPY;  Surgeon: Corbin Ade, MD;  Location: [facility];  Service: Endoscopy;  Laterality: N/A;  2:15   COLONOSCOPY N/A 01/17/2018   Three 4-7 mm polyps in descending colon and at hepatic flexure, s/p removal. Tubular adenomas. Surveillance in 2022.    COLONOSCOPY WITH PROPOFOL N/A 03/26/2020    one 10 mm polyp (tubular adenoma), otherwise  normal, s/p segmental biopsies (benign)   POLYPECTOMY  01/17/2018   Procedure: POLYPECTOMY;  Surgeon: Corbin Ade, MD;  [facility]: [facility];  Service: Endoscopy;;  hepatic flexure x2;descending colon   POLYPECTOMY  03/26/2020   Procedure: POLYPECTOMY;  Surgeon: Corbin Ade, MD;  Location: [facility];  Service: Endoscopy;;   Patient Active Problem List   Diagnosis Date Noted   Chronic diarrhea 06/24/2020   Change in bowel habits 11/22/2019   GERD (gastroesophageal reflux disease) 11/22/2019   Vitamin D deficiency 09/22/2016   Onychomycosis 09/09/2015   Morbid obesity (HCC) 08/09/2014   Pain in joint, hand 10/28/2013   Muscle weakness (generalized) 10/28/2013   Lack of coordination 10/28/2013   Prediabetes 08/02/2013   HTN (hypertension), benign 08/02/2013   Hyperlipidemia 08/02/2013   Arthralgia of hand 08/02/2013   Tubular adenoma of colon    KNEE, ARTHRITIS, DEGEN./OSTEO 12/05/2007    PCP: Dr. Lilyan Punt  REFERRING PROVIDER: Dr. Fuller Canada  ONSET DATE: Chronic-months  REFERRING DIAG: M25.511,G89.29 (ICD-10-CM) - Chronic right shoulder pain   THERAPY DIAG:  Acute pain of right shoulder  Other symptoms and signs involving the musculoskeletal system  Rationale for Evaluation and Treatment: Rehabilitation  SUBJECTIVE:   SUBJECTIVE STATEMENT: S: "It's sore today."  PERTINENT HISTORY: Pt is a 73 y/o female presenting with right shoulder pain that has been present for months. Pt received a bicep  tendon injection on 06/01/23 with some improvement in symptoms.   PRECAUTIONS: None  WEIGHT BEARING RESTRICTIONS: No  PAIN:  Are you having pain? Yes: NPRS scale: 6/10 Pain location: anterior shoulder Pain description: aching, sore at injection site Aggravating factors: reaching back and into er Relieving factors: arthritis  FALLS: Has patient fallen in last 6 months? No  PLOF: Independent  PATIENT GOALS: To have less pain in the RUE  NEXT MD  VISIT: as needed  OBJECTIVE:   HAND DOMINANCE: Right  ADLs: Overall ADLs: Pt is having difficulty reaching overhead and behind back, has difficulty with reaching into overhead cabinet. Pt has pain with lifting tasks such as laundry or picking up a trash bag.   FUNCTIONAL OUTCOME MEASURES: FOTO: 62/100  UPPER EXTREMITY ROM:       Assessed in sitting, er/IR adducted  Active ROM Right eval  Shoulder flexion 135  Shoulder abduction 121  Shoulder internal rotation 90  Shoulder external rotation 58  (Blank rows = not tested)    Assessed in supine, er/IR adducted  Passive ROM Right eval  Shoulder flexion 151  Shoulder abduction 142  Shoulder internal rotation 90  Shoulder external rotation 83  (Blank rows = not tested)   UPPER EXTREMITY MMT:     Assessed in sitting, er/IR adducted  MMT Right eval  Shoulder flexion 4+/5  Shoulder abduction 4/5  Shoulder internal rotation 5/5  Shoulder external rotation 4/5  (Blank rows = not tested)  HAND FUNCTION: Grip strength: Right: 36 lbs; Left: 45 lbs   OBSERVATIONS: mod fascial restrictions along right upper arm, anterior shoulder, trapezius, and scapular regions   TODAY'S TREATMENT:                                                                                                                              DATE:   06/26/23 -Manual therapy: myofascial release and trigger point massage applied to biceps, trapezius, and scapular region, in order to reduce fascial restrictions and pain, as well as to improve ROM. -A/ROM: seated, flexion, abduction, protraction, horizontal abduction, er/IR, x12 -X to V arms, x10 -Goal Post arms, x10 -PNF strengthening: red band, chest pulls, overhead pulls, er pulls, PNF up, PNF down, x12 -UBE: level 1, 2' forward, 2' reverse, pace: 5.0  06/22/23 -Manual therapy: myofascial release and trigger point massage applied to biceps, trapezius, and scapular region, in order to reduce fascial  restrictions and pain, as well as to improve ROM.  -P/ROM: supine-flexion, abduction, er/IR, 5 reps -A/ROM: supine-flexion, abduction, protraction, horizontal abduction, er/IR, 12 reps -Proximal Shoulder Exercises: paddles, criss cross, circles both directions, 10 reps -A/ROM: standing-flexion, abduction, protraction, horizontal abduction, er/IR, 10 reps -Functional reaching: pt placing 10 cones on middle shelf of overhead cabinet in flexion, removing in abduction -Scapular theraband: red-row, extension, retraction, 10 reps -UBE: level 1, 2' forward, 2' reverse, pace: 5.0  06/19/23 -Manual therapy: myofascial release and trigger point massage applied to biceps, trapezius, and scapular  region, in order to reduce fascial restrictions and pain, as well as to improve ROM.  -P/ROM: supine-flexion, abduction, er/IR, 5 reps -A/ROM: supine-flexion, abduction, protraction, horizontal abduction, er/IR, 12 reps -Proximal Shoulder Exercises: paddles, criss cross, circles both directions, 10 reps -A/ROM: standing-flexion, abduction, protraction, horizontal abduction, er/IR, 10 reps -Proximal shoulder strengthening: 90 degrees flexion on doorway -Scapular theraband: row, extension, retraction, 10 reps -UBE: level 1, 2' forward, 2' reverse, pace: 5.0    PATIENT EDUCATION: Education details: PNF Strengthening Person educated: Patient Education method: Explanation, Demonstration, and Handouts Education comprehension: verbalized understanding and returned demonstration  HOME EXERCISE PROGRAM: Eval: AA/ROM 1/9: A/ROM, Isometrics 1/20: red theraband scapular strengthening 1/27: PNF Strengthening  GOALS: Goals reviewed with patient? Yes   SHORT TERM GOALS: Target date: 07/06/23  Pt will be provided with and educated on HEP to improve mobility in RUE required for use during ADL completion.   Goal status: IN PROGRESS  2.  Pt will increase RUE A/ROM by 15+ degrees to improve ability to use RUE during  dressing and bathing tasks with minimal compensatory strategies.   Goal status: IN PROGRESS  3.  Pt will increase RUE strength to 4+/5 greater to improve ability to use RUE when lifting or carrying items during meal preparation/housework/yardwork tasks  Goal status: IN PROGRESS  4. Pt will decrease pain in RUE to 3/10 or less to improve ability to sleep for 2+ consecutive hours without waking due to pain.   Goal status: IN PROGRESS  5.  Pt will decrease RUE fascial restrictions to min amounts or less to improve mobility required for functional reaching tasks.   Goal status: IN PROGRESS    ASSESSMENT:  CLINICAL IMPRESSION: This session, pt continuing to have significant pain/discomfort in her shoulder despite heat at home and exercises with therapy. She continues to have good ROM, however this session her tolerance was noted to be decreased due to the pain, requiring multiple rest breaks. OT added PNF strengthening to continue improving functional strengthening and movement, as well as tolerance. Verbal and tactile cuing provided for positioning and technique.    PERFORMANCE DEFICITS: in functional skills including in functional skills including ADLs, IADLs, coordination, tone, ROM, strength, pain, fascial restrictions, muscle spasms, and UE functional use   PLAN:  OT FREQUENCY: 2x/week  OT DURATION: 4 weeks  PLANNED INTERVENTIONS: 97168 OT Re-evaluation, 97535 self care/ADL training, 40981 therapeutic exercise, 97530 therapeutic activity, 97140 manual therapy, 97035 ultrasound, 97014 electrical stimulation unattended, patient/family education, and DME and/or AE instructions  CONSULTED AND AGREED WITH PLAN OF CARE: Patient  PLAN FOR NEXT SESSION: Follow up on HEP, manual techniques, A/ROM to strengthening as able to tolerate    Trish Mage, OTR/L  [phone] 06/26/2023, 2:00 PM

## 2023-06-26 NOTE — Patient Instructions (Signed)

## 2023-06-29 ENCOUNTER — Ambulatory Visit (HOSPITAL_COMMUNITY): Payer: Medicare Other | Admitting: Occupational Therapy

## 2023-06-29 ENCOUNTER — Encounter (HOSPITAL_COMMUNITY): Payer: Self-pay | Admitting: Occupational Therapy

## 2023-06-29 DIAGNOSIS — R29898 Other symptoms and signs involving the musculoskeletal system: Secondary | ICD-10-CM

## 2023-06-29 DIAGNOSIS — M25511 Pain in right shoulder: Secondary | ICD-10-CM | POA: Diagnosis not present

## 2023-06-29 NOTE — Therapy (Signed)
OUTPATIENT OCCUPATIONAL THERAPY ORTHO TREATMENT NOTE  Patient Name: Aimee Dixon MRN: [id number] DOB:[birth year], 73 y.o., female Today's Date: 06/29/2023   END OF SESSION:  OT End of Session - 06/29/23 1351     Visit Number 7    Number of Visits 8    Date for OT Re-Evaluation 07/06/23    Authorization Type [employer] Medicare, $20 copay    Authorization Time Period 8 visits approved (06/06/23-07/04/23)    Authorization - Visit Number 6    Authorization - Number of Visits 8    Progress Note Due on Visit 10    OT Start Time 1310    OT Stop Time 1350    OT Time Calculation (min) 40 min    Activity Tolerance Patient tolerated treatment well    Behavior During Therapy WFL for tasks assessed/performed             Past Medical History:  Diagnosis Date   Hyperlipidemia    Hypertension    IDA (iron deficiency anemia)    Tubular adenoma of colon    Past Surgical History:  Procedure Laterality Date   ABDOMINAL HYSTERECTOMY     BIOPSY  03/26/2020   Procedure: BIOPSY;  Surgeon: Corbin Ade, MD;  [facility]: [facility];  Service: Endoscopy;;   COLONOSCOPY  06/30/2003   WUJ:WJXB papilla/Otherwise,normal rectum/Pedunculated polyp at middescending colon/Smaller pedunculated polyp at hepatic flexure, cold snared   COLONOSCOPY   08/07/2006   JYN:[id number] internal hemorrhoids, otherwise normal rectum/Diminutive ascending colon adenomatous polyps   COLONOSCOPY  08/03/2009   RMR: tubular adenomas removed from hepatic flexure & desc colon, internal hemorrhoids and anal papilla otherwise norma;   COLONOSCOPY N/A 10/03/2012   Procedure: COLONOSCOPY;  Surgeon: Corbin Ade, MD;  [facility]: [facility];  Service: Endoscopy;  Laterality: N/A;  2:15   COLONOSCOPY N/A 01/17/2018   Three 4-7 mm polyps in descending colon and at hepatic flexure, s/p removal. Tubular adenomas. Surveillance in 2022.    COLONOSCOPY WITH PROPOFOL N/A 03/26/2020    one 10 mm polyp (tubular adenoma), otherwise normal,  s/p segmental biopsies (benign)   POLYPECTOMY  01/17/2018   Procedure: POLYPECTOMY;  Surgeon: Corbin Ade, MD;  [facility]: [facility];  Service: Endoscopy;;  hepatic flexure x2;descending colon   POLYPECTOMY  03/26/2020   Procedure: POLYPECTOMY;  Surgeon: Corbin Ade, MD;  [facility]: [facility];  Service: Endoscopy;;   Patient Active Problem List   Diagnosis Date Noted   Chronic diarrhea 06/24/2020   Change in bowel habits 11/22/2019   GERD (gastroesophageal reflux disease) 11/22/2019   Vitamin D deficiency 09/22/2016   Onychomycosis 09/09/2015   Morbid obesity (HCC) 08/09/2014   Pain in joint, hand 10/28/2013   Muscle weakness (generalized) 10/28/2013   Lack of coordination 10/28/2013   Prediabetes 08/02/2013   HTN (hypertension), benign 08/02/2013   Hyperlipidemia 08/02/2013   Arthralgia of hand 08/02/2013   Tubular adenoma of colon    KNEE, ARTHRITIS, DEGEN./OSTEO 12/05/2007    PCP: Dr. Lilyan Punt  REFERRING PROVIDER: Dr. Fuller Canada  ONSET DATE: Chronic-months  REFERRING DIAG: M25.511,G89.29 (ICD-10-CM) - Chronic right shoulder pain   THERAPY DIAG:  Acute pain of right shoulder  Other symptoms and signs involving the musculoskeletal system  Rationale for Evaluation and Treatment: Rehabilitation  SUBJECTIVE:   SUBJECTIVE STATEMENT: S: "It's pretty uncomfortable lately."  PERTINENT HISTORY: Pt is a 73 y/o female presenting with right shoulder pain that has been present for months. Pt received a bicep tendon injection  on 06/01/23 with some improvement in symptoms.   PRECAUTIONS: None  WEIGHT BEARING RESTRICTIONS: No  PAIN:  Are you having pain? Yes: NPRS scale: 6/10 Pain location: anterior shoulder Pain description: aching, sore at injection site Aggravating factors: reaching back and into er Relieving factors: arthritis  FALLS: Has patient fallen in last 6 months? No  PLOF: Independent  PATIENT GOALS: To have less pain in the RUE  NEXT  MD VISIT: as needed  OBJECTIVE:   HAND DOMINANCE: Right  ADLs: Overall ADLs: Pt is having difficulty reaching overhead and behind back, has difficulty with reaching into overhead cabinet. Pt has pain with lifting tasks such as laundry or picking up a trash bag.   FUNCTIONAL OUTCOME MEASURES: FOTO: 62/100  UPPER EXTREMITY ROM:       Assessed in sitting, er/IR adducted  Active ROM Right eval  Shoulder flexion 135  Shoulder abduction 121  Shoulder internal rotation 90  Shoulder external rotation 58  (Blank rows = not tested)    Assessed in supine, er/IR adducted  Passive ROM Right eval  Shoulder flexion 151  Shoulder abduction 142  Shoulder internal rotation 90  Shoulder external rotation 83  (Blank rows = not tested)   UPPER EXTREMITY MMT:     Assessed in sitting, er/IR adducted  MMT Right eval  Shoulder flexion 4+/5  Shoulder abduction 4/5  Shoulder internal rotation 5/5  Shoulder external rotation 4/5  (Blank rows = not tested)  HAND FUNCTION: Grip strength: Right: 36 lbs; Left: 45 lbs   OBSERVATIONS: mod fascial restrictions along right upper arm, anterior shoulder, trapezius, and scapular regions   TODAY'S TREATMENT:                                                                                                                              DATE:   06/29/23 -Manual therapy: myofascial release and trigger point massage applied to biceps, trapezius, and scapular region, in order to reduce fascial restrictions and pain, as well as to improve ROM. -A/ROM: seated, flexion, abduction, protraction, horizontal abduction, er/IR, x15 -X to V arms, x12 -Goal Post arms, x12 -Proximal Shoulder Exercises: paddles, criss cross, circles both directions, 10 reps -Overhead Lacing -Functional reaching: 1# flexion 1st and 2nd shelf x10, 2# flexion 1st and 2nd shelf x10, 1# abduction 1st and 2nd shelf x10  06/26/23 -Manual therapy: myofascial release and trigger point  massage applied to biceps, trapezius, and scapular region, in order to reduce fascial restrictions and pain, as well as to improve ROM. -A/ROM: seated, flexion, abduction, protraction, horizontal abduction, er/IR, x12 -X to V arms, x10 -Goal Post arms, x10 -PNF strengthening: red band, chest pulls, overhead pulls, er pulls, PNF up, PNF down, x12 -UBE: level 1, 2' forward, 2' reverse, pace: 5.0  06/22/23 -Manual therapy: myofascial release and trigger point massage applied to biceps, trapezius, and scapular region, in order to reduce fascial restrictions and pain, as well as to improve ROM.  -P/ROM:  supine-flexion, abduction, er/IR, 5 reps -A/ROM: supine-flexion, abduction, protraction, horizontal abduction, er/IR, 12 reps -Proximal Shoulder Exercises: paddles, criss cross, circles both directions, 10 reps -A/ROM: standing-flexion, abduction, protraction, horizontal abduction, er/IR, 10 reps -Functional reaching: pt placing 10 cones on middle shelf of overhead cabinet in flexion, removing in abduction -Scapular theraband: red-row, extension, retraction, 10 reps -UBE: level 1, 2' forward, 2' reverse, pace: 5.0   PATIENT EDUCATION: Education details: Continue HEP Person educated: Patient Education method: Explanation, Demonstration, and Handouts Education comprehension: verbalized understanding and returned demonstration  HOME EXERCISE PROGRAM: Eval: AA/ROM 1/9: A/ROM, Isometrics 1/20: red theraband scapular strengthening 1/27: PNF Strengthening  GOALS: Goals reviewed with patient? Yes   SHORT TERM GOALS: Target date: 07/06/23  Pt will be provided with and educated on HEP to improve mobility in RUE required for use during ADL completion.   Goal status: IN PROGRESS  2.  Pt will increase RUE A/ROM by 15+ degrees to improve ability to use RUE during dressing and bathing tasks with minimal compensatory strategies.   Goal status: IN PROGRESS  3.  Pt will increase RUE strength to 4+/5  greater to improve ability to use RUE when lifting or carrying items during meal preparation/housework/yardwork tasks  Goal status: IN PROGRESS  4. Pt will decrease pain in RUE to 3/10 or less to improve ability to sleep for 2+ consecutive hours without waking due to pain.   Goal status: IN PROGRESS  5.  Pt will decrease RUE fascial restrictions to min amounts or less to improve mobility required for functional reaching tasks.   Goal status: IN PROGRESS    ASSESSMENT:  CLINICAL IMPRESSION: This session, pt having increased pain and discomfort, especially with flexion and abduction. She required multiple rest breaks due to pain and fatigue. She was able to work on more functional movements and endurance based motions this session. She continued to require multiple rest breaks due to muscle fatigue. OT providing verbal and tactile cuing for positioning and technique throughout session.    PERFORMANCE DEFICITS: in functional skills including in functional skills including ADLs, IADLs, coordination, tone, ROM, strength, pain, fascial restrictions, muscle spasms, and UE functional use   PLAN:  OT FREQUENCY: 2x/week  OT DURATION: 4 weeks  PLANNED INTERVENTIONS: 97168 OT Re-evaluation, 97535 self care/ADL training, 16109 therapeutic exercise, 97530 therapeutic activity, 97140 manual therapy, 97035 ultrasound, 97014 electrical stimulation unattended, patient/family education, and DME and/or AE instructions  CONSULTED AND AGREED WITH PLAN OF CARE: Patient  PLAN FOR NEXT SESSION: Follow up on HEP, manual techniques, A/ROM to strengthening as able to tolerate, add stretches, Reassessment    Trish Mage, OTR/L [phone] 06/29/2023, 1:52 PM

## 2023-07-03 ENCOUNTER — Encounter (HOSPITAL_COMMUNITY): Payer: Medicare Other | Admitting: Occupational Therapy

## 2023-07-05 ENCOUNTER — Telehealth: Payer: Medicare Other | Admitting: Physician Assistant

## 2023-07-05 DIAGNOSIS — J019 Acute sinusitis, unspecified: Secondary | ICD-10-CM

## 2023-07-05 DIAGNOSIS — B9689 Other specified bacterial agents as the cause of diseases classified elsewhere: Secondary | ICD-10-CM

## 2023-07-05 MED ORDER — BENZONATATE 100 MG PO CAPS: 100.0000 mg | ORAL_CAPSULE | Freq: Three times a day (TID) | ORAL | 0 refills | Status: DC | PRN

## 2023-07-05 MED ORDER — AMOXICILLIN-POT CLAVULANATE 875-125 MG PO TABS: 1.0000 | ORAL_TABLET | Freq: Two times a day (BID) | ORAL | 0 refills | Status: DC

## 2023-07-05 NOTE — Progress Notes (Signed)
 E-Visit for Sinus Problems  We are sorry that you are not feeling well.  Here is how we plan to help!  Based on what you have shared with me it looks like you have sinusitis.  Sinusitis is inflammation and infection in the sinus cavities of the head.  Based on your presentation I believe you most likely have Acute Bacterial Sinusitis.  This is an infection caused by bacteria and is treated with antibiotics. I have prescribed Augmentin  875mg /125mg  one tablet twice daily with food, for 7 days. I have also prescribed Tessalon  for cough. You may use an oral decongestant such as Mucinex D or if you have glaucoma or high blood pressure use plain Mucinex. Saline nasal spray help and can safely be used as often as needed for congestion.  If you develop worsening sinus pain, fever or notice severe headache and vision changes, or if symptoms are not better after completion of antibiotic, please schedule an appointment with a health care provider.    Sinus infections are not as easily transmitted as other respiratory infection, however we still recommend that you avoid close contact with loved ones, especially the very young and elderly.  Remember to wash your hands thoroughly throughout the day as this is the number one way to prevent the spread of infection!  Home Care: Only take medications as instructed by your medical team. Complete the entire course of an antibiotic. Do not take these medications with alcohol. A steam or ultrasonic humidifier can help congestion.  You can place a towel over your head and breathe in the steam from hot water  coming from a faucet. Avoid close contacts especially the very young and the elderly. Cover your mouth when you cough or sneeze. Always remember to wash your hands.  Get Help Right Away If: You develop worsening fever or sinus pain. You develop a severe head ache or visual changes. Your symptoms persist after you have completed your treatment plan.  Make sure  you Understand these instructions. Will watch your condition. Will get help right away if you are not doing well or get worse.  Thank you for choosing an e-visit.  Your e-visit answers were reviewed by a board certified advanced clinical practitioner to complete your personal care plan. Depending upon the condition, your plan could have included both over the counter or prescription medications.  Please review your pharmacy choice. Make sure the pharmacy is open so you can pick up prescription now. If there is a problem, you may contact your provider through [employer] and have the prescription routed to another pharmacy.  Your safety is important to us . If you have drug allergies check your prescription carefully.   For the next 24 hours you can use MyChart to ask questions about today's visit, request a non-urgent call back, or ask for a work or school excuse. You will get an email in the next two days asking about your experience. I hope that your e-visit has been valuable and will speed your recovery.

## 2023-07-05 NOTE — Progress Notes (Signed)
 I have spent 5 minutes in review of e-visit questionnaire, review and updating patient chart, medical decision making and response to patient.   Piedad Climes, PA-C

## 2023-07-06 ENCOUNTER — Encounter (HOSPITAL_COMMUNITY): Payer: Medicare Other | Admitting: Occupational Therapy

## 2023-07-24 ENCOUNTER — Other Ambulatory Visit: Payer: Self-pay | Admitting: Family Medicine

## 2023-07-24 NOTE — Telephone Encounter (Signed)
 Nurses-patient needs to schedule follow-up visit with myself or with Toni Amend before I will do more refills

## 2023-07-25 NOTE — Telephone Encounter (Signed)
 Once the appointment is scheduled please let me know that we will be more than willing to send in the refill to cover them until their visit

## 2023-08-11 ENCOUNTER — Other Ambulatory Visit: Payer: Self-pay | Admitting: Family Medicine

## 2023-09-10 ENCOUNTER — Other Ambulatory Visit: Payer: Self-pay | Admitting: Family Medicine

## 2023-09-11 ENCOUNTER — Other Ambulatory Visit: Payer: Self-pay

## 2023-09-11 MED ORDER — NIFEDIPINE ER OSMOTIC RELEASE 30 MG PO TB24: ORAL_TABLET | ORAL | 0 refills | Status: DC

## 2023-10-12 ENCOUNTER — Telehealth: Payer: Self-pay | Admitting: Family Medicine

## 2023-10-12 MED ORDER — SERTRALINE HCL 100 MG PO TABS: ORAL_TABLET | ORAL | 0 refills | Status: DC

## 2023-10-12 NOTE — Telephone Encounter (Signed)
 Refill on sertraline  (ZOLOFT ) 100 MG tablet  [facility]

## 2023-10-22 DIAGNOSIS — S82831A Other fracture of upper and lower end of right fibula, initial encounter for closed fracture: Secondary | ICD-10-CM | POA: Diagnosis not present

## 2023-11-03 ENCOUNTER — Other Ambulatory Visit: Payer: Self-pay | Admitting: Family Medicine

## 2023-11-07 DIAGNOSIS — S82831A Other fracture of upper and lower end of right fibula, initial encounter for closed fracture: Secondary | ICD-10-CM | POA: Diagnosis not present

## 2023-11-15 ENCOUNTER — Other Ambulatory Visit: Payer: Self-pay | Admitting: Family Medicine

## 2023-11-16 ENCOUNTER — Other Ambulatory Visit: Payer: Self-pay | Admitting: Family Medicine

## 2023-11-20 ENCOUNTER — Other Ambulatory Visit: Payer: Self-pay | Admitting: Family Medicine

## 2023-12-05 DIAGNOSIS — S82831A Other fracture of upper and lower end of right fibula, initial encounter for closed fracture: Secondary | ICD-10-CM | POA: Diagnosis not present

## 2023-12-11 ENCOUNTER — Other Ambulatory Visit: Payer: Self-pay | Admitting: Family Medicine

## 2023-12-26 ENCOUNTER — Telehealth: Payer: Self-pay | Admitting: Family Medicine

## 2023-12-26 MED ORDER — VALSARTAN-HYDROCHLOROTHIAZIDE 160-25 MG PO TABS: 1.0000 | ORAL_TABLET | Freq: Every day | ORAL | 1 refills | Status: AC

## 2023-12-26 NOTE — Telephone Encounter (Signed)
 Nurses Patient needs office visit Please have front order and set up office visit either with myself or Elveria for this fall  Also needs lipid, liver, metabolic 7, A1c, urine ACR  Diagnosis hypertension hyperlipidemia high risk med diabetes  Please have the patient do her lab work-she can go ahead and get this completed in the near future Needs to do a follow-up visit either with myself or Elveria in September, October at the latest thank you

## 2023-12-28 ENCOUNTER — Other Ambulatory Visit: Payer: Self-pay | Admitting: Family Medicine

## 2023-12-28 MED ORDER — ROSUVASTATIN CALCIUM 40 MG PO TABS: ORAL_TABLET | ORAL | 1 refills | Status: DC

## 2023-12-28 NOTE — Telephone Encounter (Signed)
 Copied from CRM #[id number]. Topic: Clinical - Medication Refill >> Dec 28, 2023 10:04 AM Delon DASEN wrote: Medication: rosuvastatin  (CRESTOR ) 40 MG tablet  Has the patient contacted their pharmacy? Yes (Agent: If no, request that the patient contact the pharmacy for the refill. If patient does not wish to contact the pharmacy document the reason why and proceed with request.) (Agent: If yes, when and what did the pharmacy advise?)  This is the patient's preferred pharmacy:  [id number] - [place], [place] - [address] AT SEC OF [address] [place] [address] Phone: [phone] Fax: [phone]  Is this the correct pharmacy for this prescription? Yes If no, delete pharmacy and type the correct one.   Has the prescription been filled recently? Yes  Is the patient out of the medication? Yes  Has the patient been seen for an appointment in the last year OR does the patient have an upcoming appointment? Yes  Can we respond through MyChart? Yes  Agent: Please be advised that Rx refills may take up to 3 business days. We ask that you follow-up with your pharmacy.

## 2024-01-01 ENCOUNTER — Other Ambulatory Visit: Payer: Self-pay

## 2024-01-01 DIAGNOSIS — Z79899 Other long term (current) drug therapy: Secondary | ICD-10-CM

## 2024-01-01 DIAGNOSIS — E782 Mixed hyperlipidemia: Secondary | ICD-10-CM

## 2024-01-01 DIAGNOSIS — I10 Essential (primary) hypertension: Secondary | ICD-10-CM

## 2024-01-01 DIAGNOSIS — E119 Type 2 diabetes mellitus without complications: Secondary | ICD-10-CM

## 2024-01-04 DIAGNOSIS — S82831A Other fracture of upper and lower end of right fibula, initial encounter for closed fracture: Secondary | ICD-10-CM | POA: Diagnosis not present

## 2024-02-09 ENCOUNTER — Encounter: Payer: Self-pay | Admitting: Nurse Practitioner

## 2024-02-09 ENCOUNTER — Ambulatory Visit (INDEPENDENT_AMBULATORY_CARE_PROVIDER_SITE_OTHER): Admitting: Nurse Practitioner

## 2024-02-09 VITALS — BP 136/82 | HR 89 | Ht 62.5 in | Wt 209.0 lb

## 2024-02-09 DIAGNOSIS — I10 Essential (primary) hypertension: Secondary | ICD-10-CM | POA: Diagnosis not present

## 2024-02-09 DIAGNOSIS — K219 Gastro-esophageal reflux disease without esophagitis: Secondary | ICD-10-CM

## 2024-02-09 DIAGNOSIS — K582 Mixed irritable bowel syndrome: Secondary | ICD-10-CM

## 2024-02-09 DIAGNOSIS — E119 Type 2 diabetes mellitus without complications: Secondary | ICD-10-CM | POA: Diagnosis not present

## 2024-02-09 DIAGNOSIS — J300 Vasomotor rhinitis: Secondary | ICD-10-CM | POA: Diagnosis not present

## 2024-02-09 DIAGNOSIS — Z1211 Encounter for screening for malignant neoplasm of colon: Secondary | ICD-10-CM | POA: Diagnosis not present

## 2024-02-09 DIAGNOSIS — Z79899 Other long term (current) drug therapy: Secondary | ICD-10-CM | POA: Diagnosis not present

## 2024-02-09 MED ORDER — ONDANSETRON 4 MG PO TBDP: 4.0000 mg | ORAL_TABLET | Freq: Three times a day (TID) | ORAL | 0 refills | Status: AC | PRN

## 2024-02-09 NOTE — Progress Notes (Signed)
 Subjective:    Patient ID: Aimee Dixon, female    DOB: 29-May-1951, 73 y.o.   MRN: [id number]  HPI Discussed the use of AI scribe software for clinical note transcription with the patient, who gave verbal consent to proceed.  History of Present Illness Aimee Dixon is a 73 year old female with reflux disease who presents with persistent nausea and gastrointestinal symptoms.  She has experienced persistent nausea every morning for the past two months, which eases somewhat after taking her medication. She has a poor appetite and early satiety, often feeling full quickly and unable to finish meals.  Her reflux disease is managed with pantoprazole  daily. Despite this, she continues to experience gastrointestinal symptoms, including intermittent lower abdominal pain and alternating episodes of diarrhea and constipation. No blood in stools or changes in stool color are reported. She avoids tomatoes and cucumbers due to previous discomfort. Her caffeine intake is minimal, with one cup of coffee per day, and she does not consume alcohol or smoke.  She experiences a persistent sore throat and reports nighttime coughing. She takes Zyrtec and Allegra daily for allergies but has not been using Flonase recently. No chest pain or shortness of breath is reported.  Her social history includes the recent passing of her husband, contributing to stress and feelings of depression. She is currently taking sertraline  for depression and has increased her dose back to one and a half tablets, which she feels has helped slightly. She is involved in church activities and a senior exercise class to help manage stress and maintain social connections.   Review of Systems  Constitutional:  Positive for fatigue. Negative for fever.  HENT:  Positive for congestion, postnasal drip, sinus pressure and sore throat. Negative for ear pain.   Respiratory:  Positive for cough. Negative for chest tightness, shortness of  breath and wheezing.   Cardiovascular:  Negative for chest pain.  Gastrointestinal:  Positive for constipation, diarrhea and nausea. Negative for blood in stool.      02/09/2024    9:21 AM  Depression screen PHQ 2/9  Decreased Interest 2  Down, Depressed, Hopeless 1  PHQ - 2 Score 3  Altered sleeping 0  Tired, decreased energy 2  Change in appetite 2  Feeling bad or failure about yourself  0  Trouble concentrating 0  Moving slowly or fidgety/restless 0  Suicidal thoughts 0  PHQ-9 Score 7  Difficult doing work/chores Somewhat difficult      02/09/2024    9:21 AM 10/31/2022   10:24 AM  GAD 7 : Generalized Anxiety Score  Nervous, Anxious, on Edge 1 2  Control/stop worrying 1 2  Worry too much - different things 0 2  Trouble relaxing 2 2  Restless 2 2  Easily annoyed or irritable 0 2  Afraid - awful might happen 0 0  Total GAD 7 Score 6 12  Anxiety Difficulty Somewhat difficult Somewhat difficult    Social History   Tobacco Use   Smoking status: Never   Smokeless tobacco: Never  Vaping Use   Vaping status: Never Used  Substance Use Topics   Alcohol use: No   Drug use: No        Objective:   Physical Exam  Physical Exam VITALS: BP- 136/82 MEASUREMENTS: Weight- 298. HEENT: Eardrums retracted with normal color, no infection. Throat no erythema with cloudy drainage. Nasal mucosa swollen and pale. NECK: Small anterior cervical lymph nodes. CHEST: Lungs clear to auscultation bilaterally. CARDIOVASCULAR: Heart sounds normal.  ABDOMEN: Bowel sounds normal. Mild tenderness in lower abdomen.  No rebound or guarding.  No obvious masses.  Today's Vitals   02/09/24 0916  BP: 136/82  Pulse: 89  SpO2: 99%  Weight: 209 lb (94.8 kg)  Height: 5' 2.5 (1.588 m)   Body mass index is 37.62 kg/m.       Assessment & Plan:  1. Gastroesophageal reflux disease without esophagitis (Primary) Chronic nausea and early satiety for two months, likely related to gastrointestinal  issues or stress. No vomiting or specific food triggers identified. - Prescribed Zofran  (ondansetron ) dissolvable tablets for morning nausea. - Referred to gastroenterology for evaluation, including possible EGD and colonoscopy. - Ambulatory referral to Gastroenterology Long-standing GERD with recent increase in symptoms, possibly indicating breakthrough symptoms or additional pathology. - Continue pantoprazole  daily.  2. Irritable bowel syndrome with both constipation and diarrhea Consider daily probiotics.  Discussed lifestyle factors affecting her symptoms particularly stress.  3. Screen for colon cancer  - Ambulatory referral to Gastroenterology  4. Vasomotor rhinitis - Recommend using either Zyrtec or Allegra, not both in the same day due to dry mouth. - Add Flonase or Nasacort  nasal spray to reduce nasal swelling and open eustachian tubes.  Discussed flu and COVID-19 vaccinations. - Plans to get blood work done today that was previously ordered by Dr. Glendia. - Administer high-dose flu vaccine when available. - Advise to receive COVID-19 vaccine when available at local pharmacy.   Follow-up in October as planned, call back sooner if needed.

## 2024-02-10 ENCOUNTER — Encounter: Payer: Self-pay | Admitting: Nurse Practitioner

## 2024-02-10 DIAGNOSIS — J300 Vasomotor rhinitis: Secondary | ICD-10-CM | POA: Insufficient documentation

## 2024-02-10 DIAGNOSIS — Z1211 Encounter for screening for malignant neoplasm of colon: Secondary | ICD-10-CM | POA: Insufficient documentation

## 2024-02-10 DIAGNOSIS — K582 Mixed irritable bowel syndrome: Secondary | ICD-10-CM | POA: Insufficient documentation

## 2024-02-10 LAB — BASIC METABOLIC PANEL WITH GFR
BUN/Creatinine Ratio: 11 — ABNORMAL LOW (ref 12–28)
BUN: 11 mg/dL (ref 8–27)
CO2: 24 mmol/L (ref 20–29)
Calcium: 10.7 mg/dL — ABNORMAL HIGH (ref 8.7–10.3)
Chloride: 98 mmol/L (ref 96–106)
Creatinine, Ser: 0.97 mg/dL (ref 0.57–1.00)
Glucose: 149 mg/dL — ABNORMAL HIGH (ref 70–99)
Potassium: 3.7 mmol/L (ref 3.5–5.2)
Sodium: 141 mmol/L (ref 134–144)
eGFR: 63 mL/min/1.73 (ref >59)

## 2024-02-10 LAB — HEPATIC FUNCTION PANEL
ALT: 40 IU/L — ABNORMAL HIGH (ref 0–32)
AST: 54 IU/L — ABNORMAL HIGH (ref 0–40)
Albumin: 4.4 g/dL (ref 3.9–4.9)
Alkaline Phosphatase: 107 IU/L (ref 44–121)
Bilirubin Total: 0.5 mg/dL (ref 0.0–1.2)
Bilirubin, Direct: 0.2 mg/dL (ref 0.00–0.40)
Total Protein: 7.5 g/dL (ref 6.0–8.5)

## 2024-02-10 LAB — MICROALBUMIN / CREATININE URINE RATIO
Creatinine, Urine: 112.9 mg/dL
Microalb/Creat Ratio: 7 mg/g{creat} (ref 0–29)
Microalbumin, Urine: 7.9 ug/mL

## 2024-02-10 LAB — LIPID PANEL
Chol/HDL Ratio: 3.3 ratio (ref 0.0–4.4)
Cholesterol, Total: 141 mg/dL (ref 100–199)
HDL: 43 mg/dL (ref >39)
LDL Chol Calc (NIH): 77 mg/dL (ref 0–99)
Triglycerides: 115 mg/dL (ref 0–149)
VLDL Cholesterol Cal: 21 mg/dL (ref 5–40)

## 2024-02-10 LAB — HEMOGLOBIN A1C
Est. average glucose Bld gHb Est-mCnc: 169 mg/dL
Hgb A1c MFr Bld: 7.5 % — ABNORMAL HIGH (ref 4.8–5.6)

## 2024-02-12 ENCOUNTER — Ambulatory Visit: Payer: Self-pay | Admitting: Family Medicine

## 2024-02-15 DIAGNOSIS — S82831A Other fracture of upper and lower end of right fibula, initial encounter for closed fracture: Secondary | ICD-10-CM | POA: Diagnosis not present

## 2024-02-16 ENCOUNTER — Other Ambulatory Visit: Payer: Self-pay | Admitting: Family Medicine

## 2024-02-19 ENCOUNTER — Telehealth: Payer: Self-pay | Admitting: *Deleted

## 2024-02-19 NOTE — Telephone Encounter (Signed)
 It appears that [facility] did referral earlier in the month Please put the referral back in [facility] Please notify patient If the patient needs to call GI directly please give her the appropriate info

## 2024-02-19 NOTE — Telephone Encounter (Signed)
 Referral updated.  Patient can contact [facility]

## 2024-02-19 NOTE — Telephone Encounter (Signed)
 Copied from CRM [id number]. Topic: Referral - Request for Referral >> Feb 19, 2024 11:20 AM Chiquita SQUIBB wrote: Did the patient discuss referral with their provider in the last year? Yes (If No - schedule appointment) (If Yes - send message)  Appointment offered? No  Type of order/referral and detailed reason for visit: GI  Preference of office, provider, location: Patient called the original referral and the office stated the referral needs to be for a GI issue.   If referral order, have you been seen by this specialty before? Yes (If Yes, this issue or another issue? When? Where?  Can we respond through MyChart? Yes

## 2024-02-21 ENCOUNTER — Other Ambulatory Visit: Payer: Self-pay | Admitting: Family Medicine

## 2024-02-26 ENCOUNTER — Encounter: Payer: Self-pay | Admitting: Internal Medicine

## 2024-03-04 ENCOUNTER — Ambulatory Visit: Admitting: Gastroenterology

## 2024-03-04 ENCOUNTER — Encounter: Payer: Self-pay | Admitting: Gastroenterology

## 2024-03-04 VITALS — BP 138/83 | HR 92 | Temp 98.2°F | Ht 63.0 in | Wt 209.2 lb

## 2024-03-04 DIAGNOSIS — K529 Noninfective gastroenteritis and colitis, unspecified: Secondary | ICD-10-CM | POA: Diagnosis not present

## 2024-03-04 DIAGNOSIS — K219 Gastro-esophageal reflux disease without esophagitis: Secondary | ICD-10-CM | POA: Diagnosis not present

## 2024-03-04 DIAGNOSIS — R110 Nausea: Secondary | ICD-10-CM | POA: Diagnosis not present

## 2024-03-04 DIAGNOSIS — R7989 Other specified abnormal findings of blood chemistry: Secondary | ICD-10-CM | POA: Diagnosis not present

## 2024-03-04 DIAGNOSIS — Z860101 Personal history of adenomatous and serrated colon polyps: Secondary | ICD-10-CM | POA: Diagnosis not present

## 2024-03-04 MED ORDER — FAMOTIDINE 20 MG PO TABS: 20.0000 mg | ORAL_TABLET | Freq: Every day | ORAL | 2 refills | Status: AC

## 2024-03-04 NOTE — Patient Instructions (Signed)
 Continue pantoprazole  but take it 30 minutes before breakfast.  Start pepcid (famotidine) 20mg  at bedtime. RX sent to pharmacy.  Please have labs done at [facility]. We will be in touch with results and decide if colonoscopy to be done now along with upper endoscopy.  Until we figure out what is causing your nausea, limit use of anti-inflammatory medications like ibuprofen  (Advil  or Motrin ), naprosyn (Aleve).

## 2024-03-04 NOTE — Progress Notes (Signed)
 GI Office Note    Referring Provider: Mauro Elveria BROCKS, NP Primary Care Physician:  Mauro Elveria BROCKS, NP  Primary Gastroenterologist: Ozell Hollingshead, MD  Chief Complaint   Chief Complaint  Patient presents with   Gastroesophageal Reflux     History of Present Illness   Aimee Dixon is a 73 y.o. female presenting today at the request of Elveria Mauro, NP for GERD, IBS, screening for colon cancer. She was last seen 11/2020.    Recently with new elevation of AST/ALT with plans for repeat by PCP.   Discussed the use of AI scribe software for clinical note transcription with the patient, who gave verbal consent to proceed.   She has been experiencing persistent nausea for the past two and a half months, primarily in the mornings and persisting after breakfast. The nausea tends to subside later in the day, usually after lunch. Increasing water  intake provides some relief. She reports no vomiting except for one episode two weeks ago. Abdominal discomfort but no pain. She has noted early satiety.   Her bowel movements are urgent and often diarrhea-like, occurring once daily, typically after meals. She notes that whatever she eats 'runs straight through' her, and she has to be cautious when eating out due to the urgency. She has been using stool softeners after experiencing severe constipation about a month and a half ago, which led to painful bowel movements and hemorrhoid issues. Since starting the stool softeners, she has not experienced constipation again. No blood in the stool or nocturnal bowel movements. No melena.  She has a history of acid reflux for over five years and is currently taking pantoprazole , which she usually takes after breakfast. She experiences symptoms of reflux such as a sensation of food coming up but denies heartburn. She does not have difficulty swallowing, except for an isolated incident with tomato sauce.  Her past medical history includes diabetes,  which she has been managing with metformin . She has been told she was borderline diabetic for several years, with recent lab results indicating worsening control. She states recently she was advised by her House [facility] provider to stop tylenol  and switch over to Motrin . She had previously been on ibuprofen  for OA pain but started cutting back about a month ago. Takes Motrin  about every other day at this time.     Wt Readings from Last 3 Encounters:  03/04/24 209 lb 3.2 oz (94.9 kg)  02/09/24 209 lb (94.8 kg)  03/24/23 200 lb (90.7 kg)      Prior Data   Colonoscopy October 2021: -one 10mm polyp at splenic flexure, tubular adenoma - Random colon biopsies negative - Next colonoscopy in 5 years  Medications   Current Outpatient Medications  Medication Sig Dispense Refill   acetaminophen  (TYLENOL ) 500 MG tablet Take 1,000 mg by mouth as needed for mild pain or moderate pain.     albuterol  (VENTOLIN  HFA) 108 (90 Base) MCG/ACT inhaler Inhale 2 puffs into the lungs every 4 (four) hours as needed for wheezing. 18 g 3   azelastine  (ASTELIN ) 0.1 % nasal spray Place 2 sprays into both nostrils 2 (two) times daily. 30 mL 12   cetirizine (ZYRTEC) 10 MG tablet Take 10 mg by mouth daily.     cloNIDine  (CATAPRES ) 0.3 MG tablet TAKE 1 TABLET(0.3 MG) BY MOUTH TWICE DAILY 180 tablet 1   fexofenadine (ALLEGRA) 180 MG tablet Take 180 mg by mouth daily.     guaiFENesin (MUCINEX) 600 MG 12 hr  tablet Take 600 mg by mouth daily.      ibuprofen  (ADVIL ) 800 MG tablet Take 1 tablet (800 mg total) by mouth every 8 (eight) hours as needed. 90 tablet 1   metFORMIN  (GLUCOPHAGE ) 500 MG tablet TAKE 1 TABLET BY MOUTH EVERY DAY 90 tablet 1   Multiple Vitamins-Minerals (MULTIVITAMIN WITH MINERALS) tablet Take 1 tablet by mouth daily.     NIFEdipine  (PROCARDIA -XL/NIFEDICAL-XL) 30 MG 24 hr tablet TAKE 1 TABLET(30 MG) BY MOUTH DAILY 90 tablet 0   ondansetron  (ZOFRAN -ODT) 4 MG disintegrating tablet Take 1 tablet (4 mg total)  by mouth every 8 (eight) hours as needed for nausea. 20 tablet 0   pantoprazole  (PROTONIX ) 40 MG tablet TAKE 1 TABLET(40 MG) BY MOUTH DAILY 90 tablet 3   potassium chloride  (KLOR-CON  M) 10 MEQ tablet TAKE 1 TABLET BY MOUTH TWICE DAILY 180 tablet 1   rosuvastatin  (CRESTOR ) 40 MG tablet TAKE 1 TABLET(40 MG) BY MOUTH DAILY 90 tablet 1   sertraline  (ZOLOFT ) 100 MG tablet TAKE 1 AND 1/2 TABLETS(150 MG) BY MOUTH DAILY 45 tablet 2   valsartan -hydrochlorothiazide  (DIOVAN -HCT) 160-25 MG tablet Take 1 tablet by mouth daily. 90 tablet 1   No current facility-administered medications for this visit.    Allergies   Allergies as of 03/04/2024 - Review Complete 02/10/2024  Allergen Reaction Noted   Apresoline  [hydralazine ] Hives and Itching 07/20/2020   Norvasc [amlodipine besylate] Other (See Comments) 10/05/2012    Past Medical History   Past Medical History:  Diagnosis Date   Hyperlipidemia    Hypertension    IDA (iron deficiency anemia)    Tubular adenoma of colon     Past Surgical History   Past Surgical History:  Procedure Laterality Date   ABDOMINAL HYSTERECTOMY     BIOPSY  03/26/2020   Procedure: BIOPSY;  Surgeon: Shaaron Lamar HERO, MD;  [facility];  Service: Endoscopy;;   COLONOSCOPY  06/30/2003   MFM:Jwjo papilla/Otherwise,normal rectum/Pedunculated polyp at middescending colon/Smaller pedunculated polyp at hepatic flexure, cold snared   COLONOSCOPY   08/07/2006   MFM:Fpwpfjo internal hemorrhoids, otherwise normal rectum/Diminutive ascending colon adenomatous polyps   COLONOSCOPY  08/03/2009   RMR: tubular adenomas removed from hepatic flexure & desc colon, internal hemorrhoids and anal papilla otherwise norma;   COLONOSCOPY N/A 10/03/2012   Procedure: COLONOSCOPY;  Surgeon: Lamar HERO Shaaron, MD;  Location: [facility];  Service: Endoscopy;  Laterality: N/A;  2:15   COLONOSCOPY N/A 01/17/2018   Three 4-7 mm polyps in descending colon and at hepatic flexure, s/p removal.  Tubular adenomas. Surveillance in 2022.    COLONOSCOPY WITH PROPOFOL  N/A 03/26/2020    one 10 mm polyp (tubular adenoma), otherwise normal, s/p segmental biopsies (benign)   POLYPECTOMY  01/17/2018   Procedure: POLYPECTOMY;  Surgeon: Shaaron Lamar HERO, MD;  [facility]: [facility];  Service: Endoscopy;;  hepatic flexure x2;descending colon   POLYPECTOMY  03/26/2020   Procedure: POLYPECTOMY;  Surgeon: Shaaron Lamar HERO, MD;  [facility]: [facility];  Service: Endoscopy;;    Past Family History   Family History  Problem Relation Age of Onset   Lung cancer Father    Breast cancer Mother 15   Colon cancer Neg Hx     Past Social History   Social History   Socioeconomic History   Marital status: Married    Spouse name: Wiliam   Number of children: 1   Years of education: Not on file   Highest education level: Not on file  Occupational History   Occupation: Estate agent: [employer]  Tobacco Use   Smoking status: Never   Smokeless tobacco: Never  Vaping Use   Vaping status: Never Used  Substance and Sexual Activity   Alcohol use: No   Drug use: No   Sexual activity: Yes    Birth control/protection: Surgical  Other Topics Concern   Not on file  Social History Narrative   Lives w/ husband. 1 daughter, 2 stepchildren. 5 grandchildren   Social Drivers of Corporate investment banker Strain: Low Risk  (03/24/2023)   Overall Financial Resource Strain (CARDIA)    Difficulty of Paying Living Expenses: Not hard at all  Food Insecurity: No Food Insecurity (03/24/2023)   Hunger Vital Sign    Worried About Running Out of Food in the Last Year: Never true    Ran Out of Food in the Last Year: Never true  Transportation Needs: No Transportation Needs (03/24/2023)   PRAPARE - Administrator, Civil Service (Medical): No    Lack of Transportation (Non-Medical): No  Physical Activity: Sufficiently Active (03/24/2023)   Exercise Vital Sign    Days of  Exercise per Week: 7 days    Minutes of Exercise per Session: 30 min  Stress: No Stress Concern Present (03/24/2023)   [employer] of Occupational Health - Occupational Stress Questionnaire    Feeling of Stress : Not at all  Social Connections: Moderately Isolated (03/24/2023)   Social Connection and Isolation Panel    Frequency of Communication with Friends and Family: More than three times a week    Frequency of Social Gatherings with Friends and Family: More than three times a week    Attends Religious Services: Never    Database administrator or Organizations: No    Attends Engineer, structural: More than 4 times per year    Marital Status: Widowed  Intimate Partner Violence: Not At Risk (03/24/2023)   Humiliation, Afraid, Rape, and Kick questionnaire    Fear of Current or Ex-Partner: No    Emotionally Abused: No    Physically Abused: No    Sexually Abused: No    Review of Systems   General: Negative for anorexia, weight loss, fever, chills, fatigue, weakness.see hpi Eyes: Negative for vision changes.  ENT: Negative for hoarseness, difficulty swallowing , nasal congestion. CV: Negative for chest pain, angina, palpitations, dyspnea on exertion, peripheral edema.  Respiratory: Negative for dyspnea at rest, dyspnea on exertion, cough, sputum, wheezing.  GI: See history of present illness. GU:  Negative for dysuria, hematuria, urinary incontinence, urinary frequency, nocturnal urination.  MS:+ for joint pain, low back pain.  Derm: Negative for rash or itching.  Neuro: Negative for weakness, abnormal sensation, seizure, frequent headaches, memory loss,  confusion.  Psych: Negative for anxiety, depression, suicidal ideation, hallucinations.  Endo: Negative for unusual weight change.  Heme: Negative for bruising or bleeding. Allergy: Negative for rash or hives.  Physical Exam   BP 138/83 (BP Location: Right Arm, Patient Position: Sitting, Cuff Size: Large)    Pulse 92   Temp 98.2 F (36.8 C) (Oral)   Ht 5' 3 (1.6 m)   Wt 209 lb 3.2 oz (94.9 kg)   SpO2 94%   BMI 37.06 kg/m    General: Well-nourished, well-developed in no acute distress.  Head: Normocephalic, atraumatic.   Eyes: Conjunctiva pink, no icterus. Mouth: Oropharyngeal mucosa moist and pink  Neck: Supple without thyromegaly, masses, or lymphadenopathy.  Lungs: Clear to auscultation bilaterally.  Heart: Regular rate and rhythm, no murmurs rubs or gallops.  Abdomen: Bowel sounds are normal, nontender, nondistended, no hepatosplenomegaly or masses,  no abdominal bruits or hernia, no rebound or guarding.   Rectal: not performed Extremities: No lower extremity edema. No clubbing or deformities.  Neuro: Alert and oriented x 4 , grossly normal neurologically.  Skin: Warm and dry, no rash or jaundice.   Psych: Alert and cooperative, normal mood and affect.  Labs   Lab Results  Component Value Date   HGBA1C 7.5 (H) 02/09/2024   Lab Results  Component Value Date   NA 141 02/09/2024   CL 98 02/09/2024   K 3.7 02/09/2024   CO2 24 02/09/2024   BUN 11 02/09/2024   CREATININE 0.97 02/09/2024   EGFR 63 02/09/2024   CALCIUM  10.7 (H) 02/09/2024   ALBUMIN 4.4 02/09/2024   GLUCOSE 149 (H) 02/09/2024   Lab Results  Component Value Date   ALT 40 (H) 02/09/2024   AST 54 (H) 02/09/2024   ALKPHOS 107 02/09/2024   BILITOT 0.5 02/09/2024   Lab Results  Component Value Date   WBC 6.7 08/03/2020   HGB 12.7 08/03/2020   HCT 38.5 08/03/2020   MCV 89.7 08/03/2020   PLT 292 08/03/2020   No results found for: IRON, TIBC, FERRITIN   Imaging Studies   No results found.  Assessment/Plan:      Chronic nausea/GERD/early satiety: Typical heartburn well controlled. Symptoms could be secondary to refractory atypical GERD, delayed gastric emptying, gastritis, PUD. Doubt biliary but need to consider given recent new onset elevation of AST/ALT -check TSH/free T4, CBC, LFTs -take  pantoprazole  40mg  30 minutes before breakfast -add pepcid 20mg  at bedtime -anti-reflux measures -consider EGD as next step -limit NSAID use  Postprandial loose stool/history of IBS: Postprandial urgency, with loose stools. Occurring once daily. Afraid to eat outside of home. Food goes straight through. Passing liquid yellow stool.  -TSH, free T4 -likely colonoscopy in the near future.     Abnormal liver enzymes, new onset: Recent mild elevation in AST/ALT. Repeat at this time. If persistently elevated, further work up will be needed.      H/O adenomatous colon polyp: -10mm tubular adenoma removed in 02/2020, would be reasonable to update colonoscopy at this time. Await pending labs.    Sonny RAMAN. Ezzard, MHS, PA-C [facility]

## 2024-03-05 DIAGNOSIS — K219 Gastro-esophageal reflux disease without esophagitis: Secondary | ICD-10-CM | POA: Diagnosis not present

## 2024-03-05 DIAGNOSIS — K529 Noninfective gastroenteritis and colitis, unspecified: Secondary | ICD-10-CM | POA: Diagnosis not present

## 2024-03-05 DIAGNOSIS — R7989 Other specified abnormal findings of blood chemistry: Secondary | ICD-10-CM | POA: Diagnosis not present

## 2024-03-05 DIAGNOSIS — R110 Nausea: Secondary | ICD-10-CM | POA: Diagnosis not present

## 2024-03-05 DIAGNOSIS — Z860101 Personal history of adenomatous and serrated colon polyps: Secondary | ICD-10-CM | POA: Diagnosis not present

## 2024-03-06 LAB — HEPATIC FUNCTION PANEL
ALT: 23 IU/L (ref 0–32)
AST: 34 IU/L (ref 0–40)
Albumin: 4.4 g/dL (ref 3.9–4.9)
Alkaline Phosphatase: 99 IU/L (ref 49–135)
Bilirubin Total: 0.5 mg/dL (ref 0.0–1.2)
Bilirubin, Direct: 0.11 mg/dL (ref 0.00–0.40)
Total Protein: 7.3 g/dL (ref 6.0–8.5)

## 2024-03-06 LAB — CBC WITH DIFFERENTIAL/PLATELET
Basophils Absolute: 0 x10E3/uL (ref 0.0–0.2)
Basos: 0 %
EOS (ABSOLUTE): 0.2 x10E3/uL (ref 0.0–0.4)
Eos: 3 %
Hematocrit: 38.7 % (ref 34.0–46.6)
Hemoglobin: 12.2 g/dL (ref 11.1–15.9)
Immature Grans (Abs): 0 x10E3/uL (ref 0.0–0.1)
Immature Granulocytes: 0 %
Lymphocytes Absolute: 2.3 x10E3/uL (ref 0.7–3.1)
Lymphs: 37 %
MCH: 28.2 pg (ref 26.6–33.0)
MCHC: 31.5 g/dL (ref 31.5–35.7)
MCV: 90 fL (ref 79–97)
Monocytes Absolute: 0.4 x10E3/uL (ref 0.1–0.9)
Monocytes: 6 %
Neutrophils Absolute: 3.4 x10E3/uL (ref 1.4–7.0)
Neutrophils: 54 %
Platelets: 277 x10E3/uL (ref 150–450)
RBC: 4.32 x10E6/uL (ref 3.77–5.28)
RDW: 13.1 % (ref 11.7–15.4)
WBC: 6.3 x10E3/uL (ref 3.4–10.8)

## 2024-03-06 LAB — TSH+FREE T4
Free T4: 1.34 ng/dL (ref 0.82–1.77)
TSH: 0.997 u[IU]/mL (ref 0.450–4.500)

## 2024-03-15 ENCOUNTER — Other Ambulatory Visit: Payer: Self-pay | Admitting: Family Medicine

## 2024-03-18 ENCOUNTER — Other Ambulatory Visit (HOSPITAL_COMMUNITY): Payer: Self-pay | Admitting: Nurse Practitioner

## 2024-03-18 DIAGNOSIS — Z1231 Encounter for screening mammogram for malignant neoplasm of breast: Secondary | ICD-10-CM

## 2024-03-21 ENCOUNTER — Ambulatory Visit (HOSPITAL_COMMUNITY)
Admission: RE | Admit: 2024-03-21 | Discharge: 2024-03-21 | Disposition: A | Discharge status: Home / Self Care | Source: Ambulatory Visit | Attending: Nurse Practitioner | Admitting: Nurse Practitioner

## 2024-03-21 DIAGNOSIS — Z780 Asymptomatic menopausal state: Secondary | ICD-10-CM | POA: Diagnosis not present

## 2024-03-21 DIAGNOSIS — Z0000 Encounter for general adult medical examination without abnormal findings: Secondary | ICD-10-CM | POA: Insufficient documentation

## 2024-03-22 ENCOUNTER — Ambulatory Visit: Payer: Self-pay | Admitting: Gastroenterology

## 2024-03-22 ENCOUNTER — Ambulatory Visit: Payer: Self-pay | Admitting: Nurse Practitioner

## 2024-03-25 ENCOUNTER — Telehealth: Payer: Self-pay | Admitting: Nurse Practitioner

## 2024-03-25 MED ORDER — SERTRALINE HCL 100 MG PO TABS: ORAL_TABLET | ORAL | 2 refills | Status: AC

## 2024-03-25 NOTE — Telephone Encounter (Signed)
 Refill on sertraline  (ZOLOFT ) 100 MG tablet  [facility]

## 2024-03-26 ENCOUNTER — Telehealth: Payer: Self-pay

## 2024-03-26 NOTE — Telephone Encounter (Signed)
 Copied from CRM [id number]. Topic: Clinical - Request for Lab/Test Order >> Mar 25, 2024 10:51 AM Aimee Dixon wrote: Reason for CRM: Patient is wanting to know if the orders for labs that are on file need to be done before her visit scheduled for 03/29/2024, is req a call back in regards to this.

## 2024-03-28 ENCOUNTER — Ambulatory Visit: Admitting: Internal Medicine

## 2024-03-29 ENCOUNTER — Ambulatory Visit: Payer: Medicare Other

## 2024-03-29 VITALS — Ht 63.0 in | Wt 209.0 lb

## 2024-03-29 DIAGNOSIS — Z0000 Encounter for general adult medical examination without abnormal findings: Secondary | ICD-10-CM | POA: Diagnosis not present

## 2024-03-29 NOTE — Patient Instructions (Signed)
 [name],  Thank you for taking the time for your Medicare Wellness Visit. I appreciate your continued commitment to your health goals. Please review the care plan we discussed, and feel free to reach out if I can assist you further.  Medicare recommends these wellness visits once per year to help you and your care team stay ahead of potential health issues. These visits are designed to focus on prevention, allowing your provider to concentrate on managing your acute and chronic conditions during your regular appointments.  Please note that Annual Wellness Visits do not include a physical exam. Some assessments may be limited, especially if the visit was conducted virtually. If needed, we may recommend a separate in-person follow-up with your provider.  Ongoing Care Seeing your primary care provider every 3 to 6 months helps us  monitor your health and provide consistent, personalized care.   Referrals If a referral was made during today's visit and you haven't received any updates within two weeks, please contact the referred provider directly to check on the status.  Recommended Screenings:  Health Maintenance  Topic Date Due   DTaP/Tdap/Td vaccine (2 - Tdap) 06/30/2021   Breast Cancer Screening  03/29/2024   Zoster (Shingles) Vaccine (2 of 2) 05/11/2024*   Flu Shot  08/27/2024*   Colon Cancer Screening  03/29/2025*   COVID-19 Vaccine (5 - Moderna risk 2025-26 season) 09/09/2024   Medicare Annual Wellness Visit  03/29/2025   DEXA scan (bone density measurement)  03/21/2029   Pneumococcal Vaccine for age over 50  Completed   Hepatitis C Screening  Completed   Meningitis B Vaccine  Aged Out  *Topic was postponed. The date shown is not the original due date.       03/29/2024    1:17 PM  Advanced Directives  Does Patient Have a Medical Advance Directive? No  Would patient like information on creating a medical advance directive? Yes (MAU/Ambulatory/Procedural Areas - Information  given)   Advance Care Planning is important because it: Ensures you receive medical care that aligns with your values, goals, and preferences. Provides guidance to your family and loved ones, reducing the emotional burden of decision-making during critical moments.  Information on Advanced Care Planning can be found at [place]  Secretary of [facility] Advance Health Care Directives Advance Health Care Directives ([email or web address]   Vision: Annual vision screenings are recommended for early detection of glaucoma, cataracts, and diabetic retinopathy. These exams can also reveal signs of chronic conditions such as diabetes and high blood pressure.  Dental: Annual dental screenings help detect early signs of oral cancer, gum disease, and other conditions linked to overall health, including heart disease and diabetes.  Please see the attached documents for additional preventive care recommendations.

## 2024-03-29 NOTE — Progress Notes (Signed)
 Subjective:   Aimee Dixon is a 73 y.o. who presents for a Medicare Wellness preventive visit.  As a reminder, Annual Wellness Visits don't include a physical exam, and some assessments may be limited, especially if this visit is performed virtually. We may recommend an in-person follow-up visit with your provider if needed.  Visit Complete: Virtual I connected with  Aimee Dixon on 03/29/24 by a video and audio enabled telemedicine application and verified that I am speaking with the correct person using two identifiers.  Patient Location: Home  Provider Location: Home Office  I discussed the limitations of evaluation and management by telemedicine. The patient expressed understanding and agreed to proceed.  Vital Signs: Because this visit was a virtual/telehealth visit, some criteria may be missing or patient reported. Any vitals not documented were not able to be obtained and vitals that have been documented are patient reported.  Persons Participating in Visit: Patient.  AWV Questionnaire: No: Patient Medicare AWV questionnaire was not completed prior to this visit.  Cardiac Risk Factors include: advanced age (>75men, >76 women);dyslipidemia;hypertension     Objective:    Today's Vitals   03/29/24 1301  Weight: 209 lb (94.8 kg)  Height: 5' 3 (1.6 m)   Body mass index is 37.02 kg/m.     03/29/2024    1:17 PM 06/06/2023    2:50 PM 03/24/2023    2:28 PM 03/14/2022   11:32 AM 05/05/2021    8:10 AM 03/09/2021    3:07 PM 03/26/2020   10:11 AM  Advanced Directives  Does Patient Have a Medical Advance Directive? No No No No No No No  Would patient like information on creating a medical advance directive? Yes (MAU/Ambulatory/Procedural Areas - Information given) No - Patient declined No - Patient declined No - Patient declined  No - Patient declined No - Patient declined    Current Medications (verified) Outpatient Encounter Medications as of 03/29/2024  Medication  Sig   acetaminophen  (TYLENOL ) 500 MG tablet Take 1,000 mg by mouth as needed for mild pain or moderate pain.   albuterol  (VENTOLIN  HFA) 108 (90 Base) MCG/ACT inhaler Inhale 2 puffs into the lungs every 4 (four) hours as needed for wheezing.   azelastine  (ASTELIN ) 0.1 % nasal spray Place 2 sprays into both nostrils 2 (two) times daily.   cetirizine (ZYRTEC) 10 MG tablet Take 10 mg by mouth daily.   cloNIDine  (CATAPRES ) 0.3 MG tablet TAKE 1 TABLET(0.3 MG) BY MOUTH TWICE DAILY   famotidine (PEPCID) 20 MG tablet Take 1 tablet (20 mg total) by mouth at bedtime.   fexofenadine (ALLEGRA) 180 MG tablet Take 180 mg by mouth daily.   guaiFENesin (MUCINEX) 600 MG 12 hr tablet Take 600 mg by mouth daily.    ibuprofen  (ADVIL ) 800 MG tablet Take 1 tablet (800 mg total) by mouth every 8 (eight) hours as needed.   metFORMIN  (GLUCOPHAGE ) 500 MG tablet TAKE 1 TABLET BY MOUTH EVERY DAY   Multiple Vitamins-Minerals (MULTIVITAMIN WITH MINERALS) tablet Take 1 tablet by mouth daily.   NIFEdipine  (PROCARDIA -XL/NIFEDICAL-XL) 30 MG 24 hr tablet TAKE 1 TABLET(30 MG) BY MOUTH DAILY   ondansetron  (ZOFRAN -ODT) 4 MG disintegrating tablet Take 1 tablet (4 mg total) by mouth every 8 (eight) hours as needed for nausea.   pantoprazole  (PROTONIX ) 40 MG tablet TAKE 1 TABLET(40 MG) BY MOUTH DAILY   potassium chloride  (KLOR-CON  M) 10 MEQ tablet TAKE 1 TABLET BY MOUTH TWICE DAILY   rosuvastatin  (CRESTOR ) 40 MG tablet TAKE 1  TABLET(40 MG) BY MOUTH DAILY   sertraline  (ZOLOFT ) 100 MG tablet TAKE 1 AND 1/2 TABLETS(150 MG) BY MOUTH DAILY   valsartan -hydrochlorothiazide  (DIOVAN -HCT) 160-25 MG tablet Take 1 tablet by mouth daily.   No facility-administered encounter medications on file as of 03/29/2024.    Allergies (verified) Apresoline  [hydralazine ] and Norvasc [amlodipine besylate]   History: Past Medical History:  Diagnosis Date   Hyperlipidemia    Hypertension    IDA (iron deficiency anemia)    Tubular adenoma of colon     Past Surgical History:  Procedure Laterality Date   ABDOMINAL HYSTERECTOMY     BIOPSY  03/26/2020   Procedure: BIOPSY;  Surgeon: Shaaron Lamar HERO, MD;  [facility]: [facility];  Service: Endoscopy;;   COLONOSCOPY  06/30/2003   MFM:Jwjo papilla/Otherwise,normal rectum/Pedunculated polyp at middescending colon/Smaller pedunculated polyp at hepatic flexure, cold snared   COLONOSCOPY   08/07/2006   MFM:Fpwpfjo internal hemorrhoids, otherwise normal rectum/Diminutive ascending colon adenomatous polyps   COLONOSCOPY  08/03/2009   RMR: tubular adenomas removed from hepatic flexure & desc colon, internal hemorrhoids and anal papilla otherwise norma;   COLONOSCOPY N/A 10/03/2012   Procedure: COLONOSCOPY;  Surgeon: Lamar HERO Shaaron, MD;  Location: [facility];  Service: Endoscopy;  Laterality: N/A;  2:15   COLONOSCOPY N/A 01/17/2018   Three 4-7 mm polyps in descending colon and at hepatic flexure, s/p removal. Tubular adenomas. Surveillance in 2022.    COLONOSCOPY WITH PROPOFOL  N/A 03/26/2020    one 10 mm polyp (tubular adenoma), otherwise normal, s/p segmental biopsies (benign)   POLYPECTOMY  01/17/2018   Procedure: POLYPECTOMY;  Surgeon: Shaaron Lamar HERO, MD;  Location: [facility];  Service: Endoscopy;;  hepatic flexure x2;descending colon   POLYPECTOMY  03/26/2020   Procedure: POLYPECTOMY;  Surgeon: Shaaron Lamar HERO, MD;  Location: [facility];  Service: Endoscopy;;   Family History  Problem Relation Age of Onset   Lung cancer Father    Breast cancer Mother 85   Colon cancer Neg Hx    Social History   Socioeconomic History   Marital status: Married    Spouse name: Aimee Dixon   Number of children: 1   Years of education: Not on file   Highest education level: Not on file  Occupational History   Occupation: Youth Worker Room    Employer: [employer]  Tobacco Use   Smoking status: Never   Smokeless tobacco: Never  Vaping Use   Vaping status: Never Used  Substance and Sexual  Activity   Alcohol use: No   Drug use: No   Sexual activity: Yes    Birth control/protection: Surgical  Other Topics Concern   Not on file  Social History Narrative   Lives w/ husband. 1 daughter, 2 stepchildren. 5 grandchildren   Social Drivers of Corporate Investment Banker Strain: Low Risk  (03/29/2024)   Overall Financial Resource Strain (CARDIA)    Difficulty of Paying Living Expenses: Not hard at all  Food Insecurity: No Food Insecurity (03/29/2024)   Hunger Vital Sign    Worried About Running Out of Food in the Last Year: Never true    Ran Out of Food in the Last Year: Never true  Transportation Needs: No Transportation Needs (03/29/2024)   PRAPARE - Administrator, Civil Service (Medical): No    Lack of Transportation (Non-Medical): No  Physical Activity: Sufficiently Active (03/29/2024)   Exercise Vital Sign    Days of Exercise per Week: 5 days  Minutes of Exercise per Session: 30 min  Stress: No Stress Concern Present (03/29/2024)   [employer] of Occupational Health - Occupational Stress Questionnaire    Feeling of Stress: Not at all  Social Connections: Moderately Isolated (03/29/2024)   Social Connection and Isolation Panel    Frequency of Communication with Friends and Family: More than three times a week    Frequency of Social Gatherings with Friends and Family: More than three times a week    Attends Religious Services: More than 4 times per year    Active Member of [employer] or Organizations: No    Attends Banker Meetings: Never    Marital Status: Widowed    Tobacco Counseling Counseling given: Not Answered    Clinical Intake:  Pre-visit preparation completed: Yes  Pain : No/denies pain  Diabetes: No  Lab Results  Component Value Date   HGBA1C 7.5 (H) 02/09/2024   HGBA1C 6.9 (H) 11/10/2022   HGBA1C 6.9 (H) 09/20/2021     How often do you need to have someone help you when you read instructions, pamphlets, or  other written materials from your doctor or pharmacy?: 1 - Never  Interpreter Needed?: No  Information entered by :: Aimee Bloodgood LPN   Activities of Daily Living     03/29/2024    1:16 PM  In your present state of health, do you have any difficulty performing the following activities:  Hearing? 0  Vision? 0  Difficulty concentrating or making decisions? 0  Walking or climbing stairs? 0  Dressing or bathing? 0  Doing errands, shopping? 0  Preparing Food and eating ? N  Using the Toilet? N  In the past six months, have you accidently leaked urine? N  Do you have problems with loss of bowel control? N  Managing your Medications? N  Managing your Finances? N  Housekeeping or managing your Housekeeping? N    Patient Care Team: Mauro Elveria BROCKS, NP as PCP - General (Family Medicine) Shaaron Lamar HERO, MD as Consulting Physician (Gastroenterology) [facility] (Ophthalmology) Gini, Rockey BRAVO, MD as Consulting Physician (Orthopedic Surgery)  I have updated your Care Teams any recent Medical Services you may have received from other providers in the past year.     Assessment:   This is a routine wellness examination for Aimee Dixon.  Hearing/Vision screen Hearing Screening - Comments:: Patient is able to hear conversational tones without difficulty. No issues reported.   Vision Screening - Comments:: Wears rx glasses - up to date with routine eye exams with [facility]    Goals Addressed             This Visit's Progress    Remain active and independent   On track      Depression Screen     03/29/2024    1:14 PM 02/09/2024    9:21 AM 03/24/2023    2:33 PM 10/31/2022   10:30 AM 10/31/2022   10:24 AM 03/14/2022   11:31 AM 03/09/2021    3:01 PM  PHQ 2/9 Scores  PHQ - 2 Score 3 3 1 1 3  0 2  PHQ- 9 Score 7 7 2  6  0 3    Fall Risk     03/29/2024    1:16 PM 02/09/2024    9:21 AM 03/24/2023    2:40 PM 10/31/2022   10:29 AM 10/31/2022   10:23 AM  Fall Risk   Falls  in the past year? 1 1 0 1 1  Number falls in past yr: 0 0 0 1 1  Injury with Fall? 1 1 0 0 0  Risk for fall due to : History of fall(s);Impaired balance/gait;Impaired mobility History of fall(s) No Fall Risks    Follow up Education provided;Falls prevention discussed;Falls evaluation completed Falls evaluation completed Falls prevention discussed      MEDICARE RISK AT HOME:  Medicare Risk at Home Any stairs in or around the home?: No If so, are there any without handrails?: No Home free of loose throw rugs in walkways, pet beds, electrical cords, etc?: Yes Adequate lighting in your home to reduce risk of falls?: Yes Life alert?: No Use of a cane, walker or w/c?: No Grab bars in the bathroom?: Yes Shower chair or bench in shower?: No Elevated toilet seat or a handicapped toilet?: Yes  TIMED UP AND GO:  Was the test performed?  No  Cognitive Function: 6CIT completed        03/29/2024    1:17 PM 03/24/2023    2:32 PM 03/14/2022   11:33 AM 03/09/2021    3:10 PM  6CIT Screen  What Year? 0 points 0 points 0 points 0 points  What month? 0 points 0 points 0 points 0 points  What time? 0 points 0 points 0 points 0 points  Count back from 20 0 points 0 points 0 points 0 points  Months in reverse 0 points 0 points 0 points 0 points  Repeat phrase 0 points 0 points 0 points 0 points  Total Score 0 points 0 points 0 points 0 points    Immunizations Immunization History  Administered Date(s) Administered   Fluad Quad(high Dose 65+) 04/20/2020, 04/23/2021   Fluad Trivalent(High Dose 65+) 04/17/2023   INFLUENZA, HIGH DOSE SEASONAL PF 04/04/2019   Influenza-Unspecified 03/31/2011, 12/28/2012, 04/02/2019   Moderna Covid-19 Fall Seasonal Vaccine 88yrs & older 03/11/2024, 03/11/2024   Moderna Sars-Covid-2 Vaccination 07/07/2019, 08/07/2019, 05/04/2020   PNEUMOCOCCAL CONJUGATE-20 09/23/2021   Pneumococcal Polysaccharide-23 12/18/2019   Td 07/01/2011   Zoster Recombinant(Shingrix)  02/11/2020   Zoster, Live 04/09/2013    Screening Tests Health Maintenance  Topic Date Due   DTaP/Tdap/Td (2 - Tdap) 06/30/2021   Mammogram  03/29/2024   Zoster Vaccines- Shingrix (2 of 2) 05/11/2024 (Originally 04/07/2020)   Influenza Vaccine  08/27/2024 (Originally 12/29/2023)   Colonoscopy  03/29/2025 (Originally 03/26/2025)   COVID-19 Vaccine (5 - Moderna risk 2025-26 season) 09/09/2024   Medicare Annual Wellness (AWV)  03/29/2025   DEXA SCAN  03/21/2029   Pneumococcal Vaccine: 50+ Years  Completed   Hepatitis C Screening  Completed   Meningococcal B Vaccine  Aged Out    Health Maintenance Items Addressed: Vaccines Due: Flu (patient plans to receive at pharmacy) ; scheduled for mammogram on 04/01/24  Additional Screening:  Vision Screening: Recommended annual ophthalmology exams for early detection of glaucoma and other disorders of the eye. Is the patient up to date with their annual eye exam?  Yes  Who is the provider or what is the name of the office in which the patient attends annual eye exams? [facility]   Dental Screening: Recommended annual dental exams for proper oral hygiene  Community Resource Referral / Chronic Care Management: CRR required this visit?  No   CCM required this visit?  No   Plan:    I have personally reviewed and noted the following in the patient's chart:   Medical and social history Use of alcohol, tobacco or illicit drugs  Current medications and  supplements including opioid prescriptions. Patient is not currently taking opioid prescriptions. Functional ability and status Nutritional status Physical activity Advanced directives List of other physicians Hospitalizations, surgeries, and ER visits in previous 12 months Vitals Screenings to include cognitive, depression, and falls Referrals and appointments  In addition, I have reviewed and discussed with patient certain preventive protocols, quality metrics, and best practice  recommendations. A written personalized care plan for preventive services as well as general preventive health recommendations were provided to patient.   Lavelle Pfeiffer [place], [place]   [id number]   After Visit Summary: (MyChart) Due to this being a telephonic visit, the after visit summary with patients personalized plan was offered to patient via MyChart   Notes: PCP Follow Up Recommendations: Patient complaining of joint pain ongoing for over a year.  Will schedule visit if she would like work up for problem.

## 2024-04-01 ENCOUNTER — Ambulatory Visit (HOSPITAL_COMMUNITY)
Admission: RE | Admit: 2024-04-01 | Discharge: 2024-04-01 | Disposition: A | Discharge status: Home / Self Care | Source: Ambulatory Visit | Attending: Nurse Practitioner | Admitting: Nurse Practitioner

## 2024-04-01 DIAGNOSIS — Z1231 Encounter for screening mammogram for malignant neoplasm of breast: Secondary | ICD-10-CM | POA: Insufficient documentation

## 2024-05-09 ENCOUNTER — Encounter: Payer: Self-pay | Admitting: *Deleted

## 2024-05-09 ENCOUNTER — Other Ambulatory Visit: Payer: Self-pay | Admitting: *Deleted

## 2024-05-09 MED ORDER — PEG 3350-KCL-NA BICARB-NACL 420 G PO SOLR: 4000.0000 mL | Freq: Once | ORAL | 0 refills | Status: AC

## 2024-05-09 NOTE — Telephone Encounter (Signed)
 Pt has been scheduled for Wednesday 06/05/24. Instructions mailed and prep sent to pharmacy.

## 2024-05-12 ENCOUNTER — Other Ambulatory Visit: Payer: Self-pay | Admitting: Family Medicine

## 2024-05-16 ENCOUNTER — Telehealth: Payer: Self-pay | Admitting: Family Medicine

## 2024-05-16 NOTE — Telephone Encounter (Signed)
Refill on  metFORMIN (GLUCOPHAGE) 500 MG tablet  [facility]

## 2024-05-21 ENCOUNTER — Other Ambulatory Visit: Payer: Self-pay | Admitting: Nurse Practitioner

## 2024-05-21 MED ORDER — METFORMIN HCL 500 MG PO TABS: 500.0000 mg | ORAL_TABLET | Freq: Every day | ORAL | 1 refills | Status: AC

## 2024-06-03 ENCOUNTER — Encounter (HOSPITAL_COMMUNITY)
Admission: RE | Admit: 2024-06-03 | Discharge: 2024-06-03 | Disposition: A | Discharge status: Home / Self Care | Source: Ambulatory Visit | Attending: Internal Medicine | Admitting: Internal Medicine

## 2024-06-05 ENCOUNTER — Ambulatory Visit (HOSPITAL_COMMUNITY)
Admission: RE | Admit: 2024-06-05 | Discharge: 2024-06-05 | Disposition: A | Discharge status: Home / Self Care | Attending: Internal Medicine | Admitting: Internal Medicine

## 2024-06-05 ENCOUNTER — Encounter (HOSPITAL_COMMUNITY)
Admission: RE | Disposition: A | Discharge status: Home / Self Care | Payer: Self-pay | Source: Home / Self Care | Attending: Internal Medicine

## 2024-06-05 ENCOUNTER — Ambulatory Visit (HOSPITAL_COMMUNITY): Admitting: Anesthesiology

## 2024-06-05 ENCOUNTER — Encounter (HOSPITAL_COMMUNITY): Payer: Self-pay | Admitting: Internal Medicine

## 2024-06-05 DIAGNOSIS — Z7951 Long term (current) use of inhaled steroids: Secondary | ICD-10-CM | POA: Diagnosis not present

## 2024-06-05 DIAGNOSIS — Z791 Long term (current) use of non-steroidal anti-inflammatories (NSAID): Secondary | ICD-10-CM | POA: Insufficient documentation

## 2024-06-05 DIAGNOSIS — K219 Gastro-esophageal reflux disease without esophagitis: Secondary | ICD-10-CM | POA: Diagnosis not present

## 2024-06-05 DIAGNOSIS — Z1211 Encounter for screening for malignant neoplasm of colon: Secondary | ICD-10-CM | POA: Diagnosis present

## 2024-06-05 DIAGNOSIS — I10 Essential (primary) hypertension: Secondary | ICD-10-CM | POA: Insufficient documentation

## 2024-06-05 DIAGNOSIS — E785 Hyperlipidemia, unspecified: Secondary | ICD-10-CM | POA: Diagnosis not present

## 2024-06-05 DIAGNOSIS — D649 Anemia, unspecified: Secondary | ICD-10-CM | POA: Insufficient documentation

## 2024-06-05 DIAGNOSIS — Z860101 Personal history of adenomatous and serrated colon polyps: Secondary | ICD-10-CM | POA: Diagnosis not present

## 2024-06-05 DIAGNOSIS — Z7984 Long term (current) use of oral hypoglycemic drugs: Secondary | ICD-10-CM | POA: Diagnosis not present

## 2024-06-05 DIAGNOSIS — Z79899 Other long term (current) drug therapy: Secondary | ICD-10-CM | POA: Insufficient documentation

## 2024-06-05 HISTORY — PX: COLONOSCOPY: SHX5424

## 2024-06-05 LAB — GLUCOSE, CAPILLARY: Glucose-Capillary: 157 mg/dL — ABNORMAL HIGH (ref 70–99)

## 2024-06-05 MED ORDER — LACTATED RINGERS IV SOLN: INTRAVENOUS | Status: DC

## 2024-06-05 MED ORDER — PROPOFOL 10 MG/ML IV BOLUS
INTRAVENOUS | Status: DC | PRN
  Administered 2024-06-05: 30 mg via INTRAVENOUS
  Administered 2024-06-05: 70 mg via INTRAVENOUS

## 2024-06-05 MED ORDER — PROPOFOL 500 MG/50ML IV EMUL
INTRAVENOUS | Status: DC | PRN
  Administered 2024-06-05: 150 ug/kg/min via INTRAVENOUS

## 2024-06-05 MED ORDER — LIDOCAINE 2% (20 MG/ML) 5 ML SYRINGE
INTRAMUSCULAR | Status: DC | PRN
  Administered 2024-06-05: 50 mg via INTRAVENOUS

## 2024-06-05 NOTE — Anesthesia Preprocedure Evaluation (Signed)
"                                    Anesthesia Evaluation  Patient identified by MRN, date of birth, ID band Patient awake    Reviewed: Allergy & Precautions, H&P , NPO status , Patient's Chart, lab work & pertinent test results, reviewed documented beta blocker date and time   Airway Mallampati: II  TM Distance: >3 FB Neck ROM: full    Dental no notable dental hx.    Pulmonary neg pulmonary ROS   Pulmonary exam normal breath sounds clear to auscultation       Cardiovascular Exercise Tolerance: Good hypertension,  Rhythm:regular Rate:Normal     Neuro/Psych negative neurological ROS  negative psych ROS   GI/Hepatic Neg liver ROS,GERD  ,,  Endo/Other  negative endocrine ROS    Renal/GU negative Renal ROS  negative genitourinary   Musculoskeletal   Abdominal   Peds  Hematology  (+) Blood dyscrasia, anemia   Anesthesia Other Findings   Reproductive/Obstetrics negative OB ROS                              Anesthesia Physical Anesthesia Plan  ASA: 2  Anesthesia Plan: MAC   Post-op Pain Management:    Induction:   PONV Risk Score and Plan: Propofol  infusion  Airway Management Planned:   Additional Equipment:   Intra-op Plan:   Post-operative Plan:   Informed Consent: I have reviewed the patients History and Physical, chart, labs and discussed the procedure including the risks, benefits and alternatives for the proposed anesthesia with the patient or authorized representative who has indicated his/her understanding and acceptance.     Dental Advisory Given  Plan Discussed with: CRNA  Anesthesia Plan Comments:         Anesthesia Quick Evaluation  "

## 2024-06-05 NOTE — H&P (Signed)
 @LOGO @   Gastroenterology Progress Note    Primary Care Physician:  Alphonsa Glendia LABOR, MD Primary Gastroenterologist:  Dr. Shaaron  Pre-Procedure History & Physical: HPI:  Aimee Dixon is a 74 y.o. female here  for surveillance colonoscopy.  History of a 1 cm adenoma removed from her colon 2021.  Past Medical History:  Diagnosis Date   Hyperlipidemia    Hypertension    IDA (iron deficiency anemia)    Tubular adenoma of colon     Past Surgical History:  Procedure Laterality Date   ABDOMINAL HYSTERECTOMY     BIOPSY  03/26/2020   Procedure: BIOPSY;  Surgeon: Shaaron Lamar HERO, MD;  Location: [facility];  Service: Endoscopy;;   COLONOSCOPY  06/30/2003   MFM:Jwjo papilla/Otherwise,normal rectum/Pedunculated polyp at middescending colon/Smaller pedunculated polyp at hepatic flexure, cold snared   COLONOSCOPY   08/07/2006   MFM:Fpwpfjo internal hemorrhoids, otherwise normal rectum/Diminutive ascending colon adenomatous polyps   COLONOSCOPY  08/03/2009   RMR: tubular adenomas removed from hepatic flexure & desc colon, internal hemorrhoids and anal papilla otherwise norma;   COLONOSCOPY N/A 10/03/2012   Procedure: COLONOSCOPY;  Surgeon: Lamar HERO Shaaron, MD;  Location: [facility];  Service: Endoscopy;  Laterality: N/A;  2:15   COLONOSCOPY N/A 01/17/2018   Three 4-7 mm polyps in descending colon and at hepatic flexure, s/p removal. Tubular adenomas. Surveillance in 2022.    COLONOSCOPY WITH PROPOFOL  N/A 03/26/2020    one 10 mm polyp (tubular adenoma), otherwise normal, s/p segmental biopsies (benign)   POLYPECTOMY  01/17/2018   Procedure: POLYPECTOMY;  Surgeon: Shaaron Lamar HERO, MD;  Location: [facility];  Service: Endoscopy;;  hepatic flexure x2;descending colon   POLYPECTOMY  03/26/2020   Procedure: POLYPECTOMY;  Surgeon: Shaaron Lamar HERO, MD;  Location: [facility];  Service: Endoscopy;;    Prior to Admission medications  Medication Sig Start Date End Date Taking? Authorizing  Provider  azelastine  (ASTELIN ) 0.1 % nasal spray Place 2 sprays into both nostrils 2 (two) times daily. 10/19/20  Yes Alphonsa Glendia LABOR, MD  cetirizine (ZYRTEC) 10 MG tablet Take 10 mg by mouth daily. 08/18/21  Yes [provider]  cloNIDine  (CATAPRES ) 0.3 MG tablet TAKE 1 TABLET(0.3 MG) BY MOUTH TWICE DAILY 02/19/24  Yes Luking, Glendia LABOR, MD  famotidine  (PEPCID ) 20 MG tablet Take 1 tablet (20 mg total) by mouth at bedtime. 03/04/24  Yes Ezzard Sonny RAMAN, PA-C  fexofenadine (ALLEGRA) 180 MG tablet Take 180 mg by mouth daily.   Yes [provider]  guaiFENesin (MUCINEX) 600 MG 12 hr tablet Take 600 mg by mouth daily.    Yes [provider]  ibuprofen  (ADVIL ) 800 MG tablet Take 1 tablet (800 mg total) by mouth every 8 (eight) hours as needed. 06/01/23  Yes Margrette Taft BRAVO, MD  metFORMIN  (GLUCOPHAGE ) 500 MG tablet Take 1 tablet (500 mg total) by mouth daily. 05/21/24  Yes Mauro Elveria BROCKS, NP  Multiple Vitamins-Minerals (MULTIVITAMIN WITH MINERALS) tablet Take 1 tablet by mouth daily.   Yes [provider]  NIFEdipine  (PROCARDIA -XL/NIFEDICAL-XL) 30 MG 24 hr tablet TAKE 1 TABLET(30 MG) BY MOUTH DAILY 03/18/24  Yes Hoskins, Carolyn C, NP  pantoprazole  (PROTONIX ) 40 MG tablet TAKE 1 TABLET(40 MG) BY MOUTH DAILY 11/16/23  Yes Alphonsa Glendia LABOR, MD  potassium chloride  (KLOR-CON  M) 10 MEQ tablet TAKE 1 TABLET BY MOUTH TWICE DAILY 05/13/24  Yes Hoskins, Carolyn C, NP  rosuvastatin  (CRESTOR ) 40 MG tablet TAKE 1 TABLET(40 MG) BY MOUTH DAILY  12/28/23  Yes Luking, Glendia LABOR, MD  sertraline  (ZOLOFT ) 100 MG tablet TAKE 1 AND 1/2 TABLETS(150 MG) BY MOUTH DAILY 03/25/24  Yes Hoskins, Carolyn C, NP  valsartan -hydrochlorothiazide  (DIOVAN -HCT) 160-25 MG tablet Take 1 tablet by mouth daily. 12/26/23  Yes Alphonsa Glendia LABOR, MD  acetaminophen  (TYLENOL ) 500 MG tablet Take 1,000 mg by mouth as needed for mild pain or moderate pain.    [provider]  albuterol  (VENTOLIN  HFA) 108 (90 Base)  MCG/ACT inhaler Inhale 2 puffs into the lungs every 4 (four) hours as needed for wheezing. 04/16/19   Alphonsa Glendia LABOR, MD  ondansetron  (ZOFRAN -ODT) 4 MG disintegrating tablet Take 1 tablet (4 mg total) by mouth every 8 (eight) hours as needed for nausea. 02/09/24   Mauro Elveria BROCKS, NP    Allergies as of 05/09/2024 - Review Complete 03/29/2024  Allergen Reaction Noted   Apresoline  [hydralazine ] Hives and Itching 07/20/2020   Norvasc [amlodipine besylate] Other (See Comments) 10/05/2012    Family History  Problem Relation Age of Onset   Lung cancer Father    Breast cancer Mother 92   Colon cancer Neg Hx     Social History   Socioeconomic History   Marital status: Married    Spouse name: Wiliam   Number of children: 1   Years of education: Not on file   Highest education level: Not on file  Occupational History   Occupation: Youth Worker Room    Employer: [employer]  Tobacco Use   Smoking status: Never   Smokeless tobacco: Never  Vaping Use   Vaping status: Never Used  Substance and Sexual Activity   Alcohol use: No   Drug use: No   Sexual activity: Yes    Birth control/protection: Surgical  Other Topics Concern   Not on file  Social History Narrative   Lives w/ husband. 1 daughter, 2 stepchildren. 5 grandchildren   Social Drivers of Health   Tobacco Use: Low Risk (06/05/2024)   Patient History    Smoking Tobacco Use: Never    Smokeless Tobacco Use: Never    Passive Exposure: Not on file  Financial Resource Strain: Low Risk (03/29/2024)   Overall Financial Resource Strain (CARDIA)    Difficulty of Paying Living Expenses: Not hard at all  Food Insecurity: No Food Insecurity (03/29/2024)   Epic    Worried About Programme Researcher, Broadcasting/film/video in the Last Year: Never true    Ran Out of Food in the Last Year: Never true  Transportation Needs: No Transportation Needs (03/29/2024)   Epic    Lack of Transportation (Medical): No    Lack of Transportation (Non-Medical): No   Physical Activity: Sufficiently Active (03/29/2024)   Exercise Vital Sign    Days of Exercise per Week: 5 days    Minutes of Exercise per Session: 30 min  Stress: No Stress Concern Present (03/29/2024)   [employer] of Occupational Health - Occupational Stress Questionnaire    Feeling of Stress: Not at all  Social Connections: Moderately Isolated (03/29/2024)   Social Connection and Isolation Panel    Frequency of Communication with Friends and Family: More than three times a week    Frequency of Social Gatherings with Friends and Family: More than three times a week    Attends Religious Services: More than 4 times per year    Active Member of [employer] or Organizations: No    Attends Banker Meetings: Never    Marital Status: Widowed  Intimate Partner Violence:  Not At Risk (03/29/2024)   Epic    Fear of Current or Ex-Partner: No    Emotionally Abused: No    Physically Abused: No    Sexually Abused: No  Depression (PHQ2-9): Medium Risk (03/29/2024)   Depression (PHQ2-9)    PHQ-2 Score: 7  Alcohol Screen: Low Risk (03/29/2024)   Alcohol Screen    Last Alcohol Screening Score (AUDIT): 0  Housing: Low Risk (03/29/2024)   Epic    Unable to Pay for Housing in the Last Year: No    Number of Times Moved in the Last Year: 0    Homeless in the Last Year: No  Utilities: Not At Risk (03/29/2024)   Epic    Threatened with loss of utilities: No  Health Literacy: Adequate Health Literacy (03/29/2024)   B1300 Health Literacy    Frequency of need for help with medical instructions: Never    Review of Systems   See HPI, otherwise negative ROS  Physical Exam: BP (!) 170/77 (BP Location: Right Arm)   Temp 98.4 F (36.9 C)   Resp 16   Ht 5' 3 (1.6 m)   Wt 94.8 kg   SpO2 99%   BMI 37.02 kg/m  General:   Alert,  Well-developed, well-nourished, pleasant and cooperative in NAD Mouth:  No deformity or lesions. Neck:  Supple; no masses or thyromegaly. No significant  cervical adenopathy. Lungs:  Clear throughout to auscultation.   No wheezes, crackles, or rhonchi. No acute distress. Heart:  Regular rate and rhythm; no murmurs, clicks, rubs,  or gallops. Abdomen: Non-distended, normal bowel sounds.  Soft and nontender without appreciable mass or hepatosplenomegaly.  Impression/Plan:   74 year old lady with a history of colonic adenoma.  Here for surveillance colonoscopy. The risks, benefits, limitations, alternatives and imponderables have been reviewed with the patient. Questions have been answered. All parties are agreeable.      Notice: This dictation was prepared with Dragon dictation along with smaller phrase technology. Any transcriptional errors that result from this process are unintentional and may not be corrected upon review.

## 2024-06-05 NOTE — Anesthesia Postprocedure Evaluation (Signed)
"   Anesthesia Post Note  Patient: Aimee Dixon  Procedure(s) Performed: COLONOSCOPY  Patient location during evaluation: Short Stay Anesthesia Type: MAC Level of consciousness: awake and alert Pain management: pain level controlled Vital Signs Assessment: post-procedure vital signs reviewed and stable Respiratory status: spontaneous breathing Cardiovascular status: blood pressure returned to baseline Postop Assessment: no apparent nausea or vomiting Anesthetic complications: no   No notable events documented.   Last Vitals:  Vitals:   06/05/24 0633 06/05/24 0800  BP: (!) 170/77 (!) 161/93  Pulse:  88  Resp: 16 18  Temp: 36.9 C 37 C  SpO2: 99% 96%    Last Pain:  Vitals:   06/05/24 0800  TempSrc: Oral  PainSc: 0-No pain                 Hayley Horn      "

## 2024-06-05 NOTE — Discharge Instructions (Signed)
" °  Colonoscopy Discharge Instructions  Read the instructions outlined below and refer to this sheet in the next few weeks. These discharge instructions provide you with general information on caring for yourself after you leave the hospital. Your doctor may also give you specific instructions. While your treatment has been planned according to the most current medical practices available, unavoidable complications occasionally occur. If you have any problems or questions after discharge, call Dr. Shaaron at [phone]. ACTIVITY You may resume your regular activity, but move at a slower pace for the next 24 hours.  Take frequent rest periods for the next 24 hours.  Walking will help get rid of the air and reduce the bloated feeling in your belly (abdomen).  No driving for 24 hours (because of the medicine (anesthesia) used during the test).   Do not sign any important legal documents or operate any machinery for 24 hours (because of the anesthesia used during the test).  NUTRITION Drink plenty of fluids.  You may resume your normal diet as instructed by your doctor.  Begin with a light meal and progress to your normal diet. Heavy or fried foods are harder to digest and may make you feel sick to your stomach (nauseated).  Avoid alcoholic beverages for 24 hours or as instructed.  MEDICATIONS You may resume your normal medications unless your doctor tells you otherwise.  WHAT YOU CAN EXPECT TODAY Some feelings of bloating in the abdomen.  Passage of more gas than usual.  Spotting of blood in your stool or on the toilet paper.  IF YOU HAD POLYPS REMOVED DURING THE COLONOSCOPY: No aspirin products for 7 days or as instructed.  No alcohol for 7 days or as instructed.  Eat a soft diet for the next 24 hours.  FINDING OUT THE RESULTS OF YOUR TEST Not all test results are available during your visit. If your test results are not back during the visit, make an appointment with your caregiver to find out the  results. Do not assume everything is normal if you have not heard from your caregiver or the medical facility. It is important for you to follow up on all of your test results.  SEEK IMMEDIATE MEDICAL ATTENTION IF: You have more than a spotting of blood in your stool.  Your belly is swollen (abdominal distention).  You are nauseated or vomiting.  You have a temperature over 101.  You have abdominal pain or discomfort that is severe or gets worse throughout the day.     No polyps found today!  A future colonoscopy is not recommended unless new symptoms develop  At patient request, call Wyline Gavel at [phone]-reviewed findings and recommendations "

## 2024-06-05 NOTE — Transfer of Care (Signed)
 Immediate Anesthesia Transfer of Care Note  Patient: Aimee Dixon  Procedure(s) Performed: COLONOSCOPY  Patient Location: Short Stay  Anesthesia Type:General  Level of Consciousness: awake  Airway & Oxygen Therapy: Patient Spontanous Breathing  Post-op Assessment: Report given to RN  Post vital signs: Reviewed and stable  Last Vitals:  Vitals Value Taken Time  BP    Temp    Pulse    Resp    SpO2      Last Pain:  Vitals:   06/05/24 0732  PainSc: 0-No pain         Complications: No notable events documented.

## 2024-06-05 NOTE — Op Note (Signed)
 [facility] Patient Name: Aimee Dixon Procedure Date: 06/05/2024 7:10 AM MRN: [id number] Date of Birth: [birth year] Attending MD: Lamar Ozell Hollingshead , MD, [id number] CSN: [id number] Age: 74 Admit Type: Outpatient Procedure:                Colonoscopy Indications:              High risk colon cancer surveillance: Personal                            history of colonic polyps Providers:                Lamar Ozell Hollingshead, MD, Madelin Hunter, RN, Chad                            Wilson, Technician Referring MD:              Medicines:                Propofol  per Anesthesia Complications:            No immediate complications. Estimated Blood Loss:     Estimated blood loss was minimal. Procedure:                Pre-Anesthesia Assessment:                           - Prior to the procedure, a History and Physical                            was performed, and patient medications and                            allergies were reviewed. The patient's tolerance of                            previous anesthesia was also reviewed. The risks                            and benefits of the procedure and the sedation                            options and risks were discussed with the patient.                            All questions were answered, and informed consent                            was obtained. Prior Anticoagulants: The patient has                            taken no anticoagulant or antiplatelet agents. ASA                            Grade Assessment: III - A patient with severe  systemic disease. After reviewing the risks and                            benefits, the patient was deemed in satisfactory                            condition to undergo the procedure.                           After obtaining informed consent, the colonoscope                            was passed under direct vision. Throughout the                            procedure, the  patient's blood pressure, pulse, and                            oxygen saturations were monitored continuously. The                            [id number] ([id number]) Colon was introduced through                            the anus and advanced to the the cecum, identified                            by appendiceal orifice and ileocecal valve. The                            colonoscopy was performed without difficulty. The                            patient tolerated the procedure well. The quality                            of the bowel preparation was adequate. The                            ileocecal valve, appendiceal orifice, and rectum                            were photographed. Scope In: 7:37:40 AM Scope Out: 7:55:01 AM Scope Withdrawal Time: 0 hours 8 minutes 16 seconds  Total Procedure Duration: 0 hours 17 minutes 21 seconds  Findings:      The perianal and digital rectal examinations were normal.      The colon (entire examined portion) appeared normal. Left colon somewhat       stiff. Was able to negotiate it without specific maneuvers. Upon with       withdrawal of a workup for areas of mucosal trauma with minimal       bleeding. Superficial.      The retroflexed view of the distal rectum and anal verge was normal and       showed no anal or  rectal abnormalities. Estimated blood loss was minimal. Impression:               - The entire examined colon is normal. Somewhat                            noncompliant left                           - The distal rectum and anal verge are normal on                            retroflexion view.                           - No specimens collected. Moderate Sedation:      Moderate (conscious) sedation was personally administered by an       anesthesia professional. The following parameters were monitored: oxygen       saturation, heart rate, blood pressure, respiratory rate, EKG, adequacy       of pulmonary ventilation, and response to  care. Recommendation:           - Patient has a contact number available for                            emergencies. The signs and symptoms of potential                            delayed complications were discussed with the                            patient. Return to normal activities tomorrow.                            Written discharge instructions were provided to the                            patient.                           - Advance diet as tolerated. A future colonoscopy                            is not recommended unless new symptoms develop.                           - Continue present medications. Procedure Code(s):        --- Professional ---                           [phone], Colonoscopy, flexible; diagnostic, including                            collection of specimen(s) by brushing or washing,                            when performed (separate procedure) Diagnosis  Code(s):        --- Professional ---                           Z86.010, Personal history of colonic polyps CPT copyright 2022 American Medical Association. All rights reserved. The codes documented in this report are preliminary and upon coder review may  be revised to meet current compliance requirements. Lamar HERO. Quency Tober, MD Lamar Ozell Hollingshead, MD 06/05/2024 8:03:29 AM This report has been signed electronically. Number of Addenda: 0

## 2024-06-06 ENCOUNTER — Encounter (HOSPITAL_COMMUNITY): Payer: Self-pay | Admitting: Internal Medicine

## 2024-06-18 ENCOUNTER — Other Ambulatory Visit: Payer: Self-pay | Admitting: Nurse Practitioner

## 2024-06-18 ENCOUNTER — Other Ambulatory Visit: Payer: Self-pay | Admitting: Family Medicine

## 2024-08-15 ENCOUNTER — Encounter: Admitting: Gastroenterology
